# Patient Record
Sex: Female | Born: 1960
Health system: Southern US, Community
[De-identification: ages and names within clinical notes are randomized; demographics above are authoritative.]

## PROBLEM LIST (undated history)

## (undated) DIAGNOSIS — M6530 Trigger finger, unspecified finger: Secondary | ICD-10-CM

## (undated) DIAGNOSIS — F3189 Other bipolar disorder: Secondary | ICD-10-CM

## (undated) DIAGNOSIS — Z972 Presence of dental prosthetic device (complete) (partial): Secondary | ICD-10-CM

## (undated) DIAGNOSIS — M1710 Unilateral primary osteoarthritis, unspecified knee: Secondary | ICD-10-CM

## (undated) DIAGNOSIS — J45909 Unspecified asthma, uncomplicated: Secondary | ICD-10-CM

## (undated) DIAGNOSIS — Z720 Tobacco use: Secondary | ICD-10-CM

## (undated) DIAGNOSIS — I10 Essential (primary) hypertension: Secondary | ICD-10-CM

## (undated) DIAGNOSIS — R569 Unspecified convulsions: Secondary | ICD-10-CM

## (undated) DIAGNOSIS — F32A Depression, unspecified: Secondary | ICD-10-CM

## (undated) DIAGNOSIS — K219 Gastro-esophageal reflux disease without esophagitis: Secondary | ICD-10-CM

## (undated) DIAGNOSIS — N200 Calculus of kidney: Secondary | ICD-10-CM

## (undated) DIAGNOSIS — IMO0002 Reserved for concepts with insufficient information to code with codable children: Secondary | ICD-10-CM

## (undated) HISTORY — DX: Tobacco use: Z72.0

## (undated) HISTORY — DX: Gastro-esophageal reflux disease without esophagitis: K21.9

## (undated) HISTORY — DX: Reserved for concepts with insufficient information to code with codable children: IMO0002

## (undated) HISTORY — DX: Calculus of kidney: N20.0

## (undated) HISTORY — DX: Unilateral primary osteoarthritis, unspecified knee: M17.10

## (undated) HISTORY — DX: Trigger finger, unspecified finger: M65.30

## (undated) HISTORY — PX: FOOT SURGERY: SHX648

## (undated) HISTORY — DX: Other bipolar disorder: F31.89

---

## 2006-09-28 ENCOUNTER — Other Ambulatory Visit: Payer: Self-pay

## 2006-09-28 ENCOUNTER — Emergency Department: Payer: Self-pay | Admitting: Emergency Medicine

## 2008-09-27 ENCOUNTER — Emergency Department: Payer: Self-pay | Admitting: Emergency Medicine

## 2009-08-31 ENCOUNTER — Emergency Department: Payer: Self-pay | Admitting: Emergency Medicine

## 2010-08-17 ENCOUNTER — Ambulatory Visit: Payer: Self-pay | Admitting: Family Medicine

## 2010-09-21 ENCOUNTER — Ambulatory Visit: Payer: Self-pay | Admitting: Internal Medicine

## 2011-03-23 ENCOUNTER — Emergency Department: Payer: Self-pay | Admitting: Emergency Medicine

## 2011-03-24 ENCOUNTER — Ambulatory Visit: Payer: Self-pay | Admitting: Family Medicine

## 2011-08-08 ENCOUNTER — Ambulatory Visit: Payer: Self-pay | Admitting: Podiatry

## 2011-08-08 LAB — CBC WITH DIFFERENTIAL/PLATELET
Basophil #: 0 10*3/uL (ref 0.0–0.1)
Eosinophil #: 0.1 10*3/uL (ref 0.0–0.7)
Eosinophil %: 1.1 %
HCT: 40.1 % (ref 35.0–47.0)
HGB: 13.5 g/dL (ref 12.0–16.0)
Lymphocyte #: 1.9 10*3/uL (ref 1.0–3.6)
MCH: 31.5 pg (ref 26.0–34.0)
MCV: 94 fL (ref 80–100)
Monocyte #: 0.5 10*3/uL (ref 0.0–0.7)
Platelet: 181 10*3/uL (ref 150–440)
RBC: 4.28 10*6/uL (ref 3.80–5.20)
RDW: 13.8 % (ref 11.5–14.5)

## 2011-08-12 ENCOUNTER — Ambulatory Visit: Payer: Self-pay | Admitting: Podiatry

## 2011-08-15 LAB — PATHOLOGY REPORT

## 2011-12-02 ENCOUNTER — Ambulatory Visit: Payer: Self-pay | Admitting: Internal Medicine

## 2012-03-06 ENCOUNTER — Ambulatory Visit: Payer: Self-pay | Admitting: Internal Medicine

## 2012-09-25 ENCOUNTER — Emergency Department: Payer: Self-pay | Admitting: Emergency Medicine

## 2012-09-25 LAB — BASIC METABOLIC PANEL
BUN: 6 mg/dL — ABNORMAL LOW (ref 7–18)
Calcium, Total: 8.9 mg/dL (ref 8.5–10.1)
Chloride: 110 mmol/L — ABNORMAL HIGH (ref 98–107)
EGFR (African American): >60
EGFR (Non-African Amer.): >60
Glucose: 112 mg/dL — ABNORMAL HIGH (ref 65–99)
Sodium: 144 mmol/L (ref 136–145)

## 2012-09-25 LAB — CBC
HCT: 42.2 % (ref 35.0–47.0)
MCH: 32.5 pg (ref 26.0–34.0)
MCHC: 34 g/dL (ref 32.0–36.0)
MCV: 96 fL (ref 80–100)
RBC: 4.42 10*6/uL (ref 3.80–5.20)
RDW: 13.1 % (ref 11.5–14.5)
WBC: 8.9 10*3/uL (ref 3.6–11.0)

## 2012-09-25 LAB — ETHANOL: Ethanol: 168 mg/dL

## 2012-11-13 ENCOUNTER — Ambulatory Visit: Payer: Self-pay | Admitting: Family Medicine

## 2013-02-24 ENCOUNTER — Ambulatory Visit: Payer: Self-pay | Admitting: Family Medicine

## 2013-09-04 ENCOUNTER — Emergency Department: Payer: Self-pay | Admitting: Emergency Medicine

## 2013-09-04 ENCOUNTER — Ambulatory Visit: Payer: Self-pay | Admitting: Physician Assistant

## 2013-09-04 LAB — COMPREHENSIVE METABOLIC PANEL
ALBUMIN: 4.2 g/dL (ref 3.4–5.0)
ALT: 26 U/L (ref 12–78)
Alkaline Phosphatase: 157 U/L — ABNORMAL HIGH
Anion Gap: 6 — ABNORMAL LOW (ref 7–16)
BILIRUBIN TOTAL: 0.7 mg/dL (ref 0.2–1.0)
BUN: 18 mg/dL (ref 7–18)
CALCIUM: 9.2 mg/dL (ref 8.5–10.1)
CHLORIDE: 104 mmol/L (ref 98–107)
CO2: 28 mmol/L (ref 21–32)
Creatinine: 0.65 mg/dL (ref 0.60–1.30)
EGFR (African American): >60
GLUCOSE: 90 mg/dL (ref 65–99)
OSMOLALITY: 277 (ref 275–301)
Potassium: 3.3 mmol/L — ABNORMAL LOW (ref 3.5–5.1)
SGOT(AST): 14 U/L — ABNORMAL LOW (ref 15–37)
Sodium: 138 mmol/L (ref 136–145)
Total Protein: 7.6 g/dL (ref 6.4–8.2)

## 2013-09-04 LAB — CBC
HCT: 42.2 % (ref 35.0–47.0)
HGB: 13.9 g/dL (ref 12.0–16.0)
MCH: 31.3 pg (ref 26.0–34.0)
MCHC: 33 g/dL (ref 32.0–36.0)
MCV: 95 fL (ref 80–100)
PLATELETS: 219 10*3/uL (ref 150–440)
RBC: 4.45 10*6/uL (ref 3.80–5.20)
RDW: 12.9 % (ref 11.5–14.5)
WBC: 8.5 10*3/uL (ref 3.6–11.0)

## 2013-09-04 LAB — CK TOTAL AND CKMB (NOT AT ARMC)
CK, Total: 63 U/L
CK-MB: 1.2 ng/mL (ref 0.5–3.6)

## 2013-09-04 LAB — TROPONIN I: Troponin-I: <0.02 ng/mL

## 2013-10-17 ENCOUNTER — Ambulatory Visit (INDEPENDENT_AMBULATORY_CARE_PROVIDER_SITE_OTHER): Payer: Medicaid Other | Admitting: Cardiovascular Disease

## 2013-10-17 ENCOUNTER — Encounter: Payer: Self-pay | Admitting: Cardiovascular Disease

## 2013-10-17 VITALS — BP 135/96 | HR 104 | Ht 65.0 in | Wt 164.2 lb

## 2013-10-17 DIAGNOSIS — R079 Chest pain, unspecified: Secondary | ICD-10-CM

## 2013-10-17 DIAGNOSIS — F319 Bipolar disorder, unspecified: Secondary | ICD-10-CM

## 2013-10-17 DIAGNOSIS — R9431 Abnormal electrocardiogram [ECG] [EKG]: Secondary | ICD-10-CM | POA: Insufficient documentation

## 2013-10-17 DIAGNOSIS — M549 Dorsalgia, unspecified: Secondary | ICD-10-CM

## 2013-10-17 DIAGNOSIS — R42 Dizziness and giddiness: Secondary | ICD-10-CM

## 2013-10-17 DIAGNOSIS — R55 Syncope and collapse: Secondary | ICD-10-CM

## 2013-10-17 NOTE — Assessment & Plan Note (Signed)
Etiology unclear at this point. No orthostasis noted in the office. Echocardiogram and Holter monitor pending. If these are normal, uncertain if she would benefit from a 30 day monitor. Suggested she keep a diary when wearing her 48-hour Holter monitor

## 2013-10-17 NOTE — Assessment & Plan Note (Signed)
Prior EKGs an EKG today is essentially unchanged and all appear to be relatively normal.

## 2013-10-17 NOTE — Patient Instructions (Addendum)
No medication changes were made.  We will order an echocardiogram to look at your heart function, Try to figure out a cause of your dizziness  Holter monitor has been ordered for dizziness Diary will be provided  Please call us if you have new issues that need to be addressed before your next appt.

## 2013-10-17 NOTE — Assessment & Plan Note (Signed)
She has been on the same medications for several years by her report. Unable to exclude side effects from the medications causing her symptoms, even her dizziness

## 2013-10-17 NOTE — Assessment & Plan Note (Signed)
Borderline prolonged QT noted at the urgent care. This can be monitored. This was also noted on her EKG today with QTc 508. Likely secondary to psychiatric medications

## 2013-10-17 NOTE — Assessment & Plan Note (Signed)
Etiology of her dizzy spells is unclear at this time. No orthostasis noted in the office today. Echocardiogram and Holter monitor has been ordered to rule out structural heart disease and arrhythmia. She reports having symptoms daily. If both of these studies are normal, this would suggest noncardiac etiology. Recommended she buy a blood pressure cuff and monitor her blood pressure when she has dizziness symptoms.

## 2013-10-17 NOTE — Progress Notes (Signed)
[name], female    DOB: 1961/06/17, 53 y.o.   MRN: [id number]  HPI Comments: Katherine Goodwin is a 53 year old woman who is a patient of Dr.Bergland with a history of smoking, bipolar disorder, managed with Adderall and Lamictal,who presents for evaluation of dizziness and near syncope/syncope and abnormal EKG. She reports that she is out on disability, partly for her chronic pain in her back, legs, feet Long smoking history  She reports that over the past 2-3 years, she has had periodic symptoms of dizziness. This has been getting worse over the past year. Now she has symptoms daily, typically while standing. Typically does not have symptoms when sitting or laying down. She reports an episode 09/03/2013 where she was preparing dinner. She walked down the hallway and slumped into the wall. She does not remember what happened. Her husband helped her to the bed. She tried to get up again later and had similar symptoms. Has been reports that her face was red, she called out to him that she was "hot". She went to bed that night, felt better the next day apart from chronic back and foot pain. She went to urgent care and was told that her blood pressure was elevated. She went by EMTs to the emergency room. Details are unavailable  In the office today, blood pressure supine was 160/97, heart rate 99, sitting 131/87 with heart rate 99, standing 132/91 with heart rate 106, in followup to minutes later and 5 minutes later 151/89, 135/96. No orthostasis noted. In terms of her family history she reports grandmother had a stroke, father had MI  Recent lab work in March 2015 showing potassium 3.3 otherwise normal renal function, normal LFTs, elevated alkaline phosphatase, normal CBC, normal chest x-ray  EKG shows Sinus tachycardia with rate 103 beats per minute, no significant ST or T wave changes Prior EKG from the hospital showed normal sinus rhythm with rate 83 beats per minute, borderline prolonged  QT, otherwise essentially normal EKG. Repeat EKG on the same day on 09/04/2013 was also essentially normal       Outpatient Encounter Prescriptions as of 10/17/2013  Medication Sig  . albuterol (PROVENTIL HFA;VENTOLIN HFA) 108 (90 BASE) MCG/ACT inhaler Inhale 2 puffs into the lungs every 6 (six) hours as needed for wheezing or shortness of breath.  . amphetamine-dextroamphetamine (ADDERALL XR) 30 MG 24 hr capsule Take 30 mg by mouth daily.  . clonazePAM (KLONOPIN) 1 MG tablet Take 1 mg by mouth 2 (two) times daily.  . fluticasone (FLONASE) 50 MCG/ACT nasal spray Place 2 sprays into both nostrils daily.  Marland Kitchen ibuprofen (ADVIL,MOTRIN) 800 MG tablet Take 800 mg by mouth every 8 (eight) hours as needed.  . lamoTRIgine (LAMICTAL) 200 MG tablet Take 200 mg by mouth 2 (two) times daily.  . Multiple Vitamin (MULTIVITAMIN) tablet Take 1 tablet by mouth daily.  Marland Kitchen omeprazole (PRILOSEC) 20 MG capsule Take 20 mg by mouth daily.    Review of Systems  Constitutional: Negative.   HENT: Negative.   Eyes: Negative.   Respiratory: Negative.   Cardiovascular: Negative.   Gastrointestinal: Negative.   Endocrine: Negative.   Musculoskeletal: Positive for arthralgias and back pain.  Skin: Negative.   Allergic/Immunologic: Negative.   Neurological: Positive for dizziness and syncope.  Hematological: Negative.   Psychiatric/Behavioral: Negative.   All other systems reviewed and are negative.   BP 135/96  Pulse 104  Ht 5\' 5"  (1.651 m)  Wt 164 lb 4 oz (74.503 kg)  BMI  27.33 kg/m2  Physical Exam  Nursing note and vitals reviewed. Constitutional: She is oriented to person, place, and time. She appears well-developed and well-nourished.  HENT:  Head: Normocephalic.  Nose: Nose normal.  Mouth/Throat: Oropharynx is clear and moist.  Eyes: Conjunctivae are normal. Pupils are equal, round, and reactive to light.  Neck: Normal range of motion. Neck supple. No JVD present.  Cardiovascular: Normal rate,  regular rhythm, S1 normal, S2 normal, normal heart sounds and intact distal pulses.  Exam reveals no gallop and no friction rub.   No murmur heard. Pulmonary/Chest: Effort normal and breath sounds normal. No respiratory distress. She has no wheezes. She has no rales. She exhibits no tenderness.  Abdominal: Soft. Bowel sounds are normal. She exhibits no distension. There is no tenderness.  Musculoskeletal: Normal range of motion. She exhibits no edema and no tenderness.  Lymphadenopathy:    She has no cervical adenopathy.  Neurological: She is alert and oriented to person, place, and time. Coordination normal.  Skin: Skin is warm and dry. No rash noted. No erythema.  Psychiatric: She has a normal mood and affect. Her behavior is normal. Judgment and thought content normal.    Assessment and Plan

## 2013-10-25 ENCOUNTER — Other Ambulatory Visit (INDEPENDENT_AMBULATORY_CARE_PROVIDER_SITE_OTHER): Payer: Medicaid Other

## 2013-10-25 ENCOUNTER — Other Ambulatory Visit: Payer: Self-pay

## 2013-10-25 DIAGNOSIS — R9431 Abnormal electrocardiogram [ECG] [EKG]: Secondary | ICD-10-CM

## 2013-10-25 DIAGNOSIS — R079 Chest pain, unspecified: Secondary | ICD-10-CM

## 2013-10-25 DIAGNOSIS — R55 Syncope and collapse: Secondary | ICD-10-CM

## 2013-10-25 DIAGNOSIS — R42 Dizziness and giddiness: Secondary | ICD-10-CM

## 2013-11-07 ENCOUNTER — Telehealth: Payer: Self-pay

## 2013-11-07 DIAGNOSIS — R55 Syncope and collapse: Secondary | ICD-10-CM

## 2013-11-07 NOTE — Telephone Encounter (Signed)
Pt would like to know holter results

## 2013-11-08 NOTE — Telephone Encounter (Signed)
Attempted to contact pt regarding ECHO and holter results.  No answer, voice mail box is full.

## 2013-11-12 NOTE — Telephone Encounter (Signed)
Reviewed results w/ pt.  She reports that she is still having issues, she stood up to hug her daughter and she fell over. She is interested in the 30 day monitor, but is concerned about the inconvenience of wearing it, as she cleans houses and sweats a lot. Advised her that the 30 day monitor will have instructions to remove during a shower. She is agreeable to this and await call from [employer]. ------

## 2013-11-12 NOTE — Addendum Note (Signed)
Addended by: Dede Query R on: 11/12/2013 09:24 AM   Modules accepted: Orders

## 2013-11-13 ENCOUNTER — Ambulatory Visit (INDEPENDENT_AMBULATORY_CARE_PROVIDER_SITE_OTHER): Payer: Medicaid Other | Admitting: *Deleted

## 2013-11-13 DIAGNOSIS — R42 Dizziness and giddiness: Secondary | ICD-10-CM

## 2013-11-13 DIAGNOSIS — R9431 Abnormal electrocardiogram [ECG] [EKG]: Secondary | ICD-10-CM

## 2013-11-13 DIAGNOSIS — R079 Chest pain, unspecified: Secondary | ICD-10-CM

## 2013-12-25 ENCOUNTER — Telehealth: Payer: Self-pay

## 2013-12-25 NOTE — Telephone Encounter (Signed)
Attempted to contact pt regarding results of event monitor:  "NSR w/ no signifcant arrythmia".  Pt's voicemail box is full, unable to leave message.

## 2013-12-31 ENCOUNTER — Encounter: Payer: Self-pay | Admitting: Cardiovascular Disease

## 2013-12-31 NOTE — Telephone Encounter (Signed)
This encounter was created in error - please disregard.

## 2013-12-31 NOTE — Telephone Encounter (Signed)
Reviewed results w/ pt.   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  Advised pt that if she continues to be symptomatic, we may repeat in cooler weather.  She is agreeable and will call w/ further questions or concerns.

## 2014-10-12 NOTE — Op Note (Signed)
PATIENT NAME:  Katherine Goodwin, Katherine Goodwin MR#:  [id number] DATE OF BIRTH:  12/09/1960  DATE OF PROCEDURE:  08/12/2011  PREOPERATIVE DIAGNOSIS: Plantar fasciitis with heel spur, right foot.   POSTOPERATIVE DIAGNOSIS: Plantar fasciitis with heel spur, right foot.   PROCEDURE: Heel spur resection with partial plantar fasciectomy, right foot.   SURGEON: Sharlotte Alamo, DPM   ANESTHESIA: General laryngeal mask airway anesthesia.   PNEUMATIC TOURNIQUET:  Right calf, 250 mmHg.   ESTIMATED BLOOD LOSS: Minimal.   PATHOLOGY: Bone spur and soft tissue fascia from the right heel.   MATERIALS: None.   COMPLICATIONS: None apparent.   OPERATIVE INDICATIONS: This is a 54 year old female with chronic heel pain in her right foot. Conservative care has proved unsuccessful and she elects for surgical intervention.   OPERATIVE PROCEDURE: The patient was taken to the operating room and placed on the table in the supine position. Following satisfactory  general anesthesia, a pneumatic tourniquet was applied at the level of the right mid calf. The foot was then prepped and draped in the usual sterile fashion. The foot was exsanguinated and the tourniquet inflated to 250 mmHg.   Attention was then directed to the medial aspect of the right heel where an approximate 4-cm linear incision was made coursing proximal to distal at the medial skin line. The incision was deepened via sharp and blunt dissection down to the medial aspect of the heel bone where a periosteal incision was made that extended distal towards the medial band of the fascia. The medial and middle band of the fascia was identified and freed from the inferior aspect of the heel. Using an Allis clamp the fascia was clamped and an approximate 1.5-cm section was removed in toto. At this point the spur was freely identified and removed using a rongeur and raw bony edges were rasped smooth. Intraoperative FluoroScan views revealed good reduction of the deformity. The  wound was flushed with copious amounts of sterile saline and closed using 3-0 Vicryl simple interrupted sutures for deep and superficial subcutaneous closure followed by 4-0 Vicryl running suture for subcutaneous and 5-0 nylon simple interrupted sutures for skin closure. Three 2-0 nylon vertical mattress sutures were then placed as retention sutures across the incision. Xeroform and a sterile bandage were applied to the right foot. The tourniquet was released and blood flow noted to return immediately to the right foot and all digits. A posterior splint was then placed with the foot 90 degrees relative to the leg. The patient tolerated the procedure and anesthesia well and was transported to the postanesthesia care unit with vital signs stable and in good condition.    ____________________________ Sharlotte Alamo, DPM tc:bjt D: 08/12/2011 12:52:56 ET T: 08/12/2011 13:12:45 ET JOB#: [id number]  cc: Sharlotte Alamo, DPM, <Dictator> Barbi Kumagai DPM ELECTRONICALLY SIGNED 08/18/2011 9:05

## 2014-12-07 ENCOUNTER — Other Ambulatory Visit: Payer: Self-pay | Admitting: Internal Medicine

## 2015-05-19 ENCOUNTER — Encounter: Payer: Self-pay | Admitting: Internal Medicine

## 2015-06-12 ENCOUNTER — Ambulatory Visit (INDEPENDENT_AMBULATORY_CARE_PROVIDER_SITE_OTHER): Payer: Medicaid Other | Admitting: Internal Medicine

## 2015-06-12 ENCOUNTER — Encounter: Payer: Self-pay | Admitting: Internal Medicine

## 2015-06-12 VITALS — BP 150/90 | HR 96 | Ht 65.0 in | Wt 158.0 lb

## 2015-06-12 DIAGNOSIS — R030 Elevated blood-pressure reading, without diagnosis of hypertension: Secondary | ICD-10-CM | POA: Diagnosis not present

## 2015-06-12 DIAGNOSIS — F17200 Nicotine dependence, unspecified, uncomplicated: Secondary | ICD-10-CM

## 2015-06-12 DIAGNOSIS — IMO0001 Reserved for inherently not codable concepts without codable children: Secondary | ICD-10-CM | POA: Insufficient documentation

## 2015-06-12 DIAGNOSIS — D485 Neoplasm of uncertain behavior of skin: Secondary | ICD-10-CM | POA: Diagnosis not present

## 2015-06-12 MED ORDER — CLOTRIMAZOLE-BETAMETHASONE 1-0.05 % EX CREA: 1.0000 "application " | TOPICAL_CREAM | Freq: Two times a day (BID) | CUTANEOUS | Status: DC

## 2015-06-12 MED ORDER — NICOTINE 21 MG/24HR TD PT24: 21.0000 mg | MEDICATED_PATCH | Freq: Every day | TRANSDERMAL | Status: DC

## 2015-06-12 NOTE — Patient Instructions (Signed)
Smoking Cessation, Tips for Success If you are ready to quit smoking, congratulations! You have chosen to help yourself be healthier. Cigarettes bring nicotine, tar, carbon monoxide, and other irritants into your body. Your lungs, heart, and blood vessels will be able to work better without these poisons. There are many different ways to quit smoking. Nicotine gum, nicotine patches, a nicotine inhaler, or nicotine nasal spray can help with physical craving. Hypnosis, support groups, and medicines help break the habit of smoking. WHAT THINGS CAN I DO TO MAKE QUITTING EASIER?  Here are some tips to help you quit for good:  Pick a date when you will quit smoking completely. Tell all of your friends and family about your plan to quit on that date.  Do not try to slowly cut down on the number of cigarettes you are smoking. Pick a quit date and quit smoking completely starting on that day.  Throw away all cigarettes.   Clean and remove all ashtrays from your home, work, and car.  On a card, write down your reasons for quitting. Carry the card with you and read it when you get the urge to smoke.  Cleanse your body of nicotine. Drink enough water and fluids to keep your urine clear or pale yellow. Do this after quitting to flush the nicotine from your body.  Learn to predict your moods. Do not let a bad situation be your excuse to have a cigarette. Some situations in your life might tempt you into wanting a cigarette.  Never have "just one" cigarette. It leads to wanting another and another. Remind yourself of your decision to quit.  Change habits associated with smoking. If you smoked while driving or when feeling stressed, try other activities to replace smoking. Stand up when drinking your coffee. Brush your teeth after eating. Sit in a different chair when you read the paper. Avoid alcohol while trying to quit, and try to drink fewer caffeinated beverages. Alcohol and caffeine may urge you to  smoke.  Avoid foods and drinks that can trigger a desire to smoke, such as sugary or spicy foods and alcohol.  Ask people who smoke not to smoke around you.  Have something planned to do right after eating or having a cup of coffee. For example, plan to take a walk or exercise.  Try a relaxation exercise to calm you down and decrease your stress. Remember, you may be tense and nervous for the first 2 weeks after you quit, but this will pass.  Find new activities to keep your hands busy. Play with a pen, coin, or rubber band. Doodle or draw things on paper.  Brush your teeth right after eating. This will help cut down on the craving for the taste of tobacco after meals. You can also try mouthwash.   Use oral substitutes in place of cigarettes. Try using lemon drops, carrots, cinnamon sticks, or chewing gum. Keep them handy so they are available when you have the urge to smoke.  When you have the urge to smoke, try deep breathing.  Designate your home as a nonsmoking area.  If you are a heavy smoker, ask your health care provider about a prescription for nicotine chewing gum. It can ease your withdrawal from nicotine.  Reward yourself. Set aside the cigarette money you save and buy yourself something nice.  Look for support from others. Join a support group or smoking cessation program. Ask someone at home or at work to help you with your plan   to quit smoking.  Always ask yourself, "Do I need this cigarette or is this just a reflex?" Tell yourself, "Today, I choose not to smoke," or "I do not want to smoke." You are reminding yourself of your decision to quit.  Do not replace cigarette smoking with electronic cigarettes (commonly called e-cigarettes). The safety of e-cigarettes is unknown, and some may contain harmful chemicals.  If you relapse, do not give up! Plan ahead and think about what you will do the next time you get the urge to smoke. HOW WILL I FEEL WHEN I QUIT SMOKING? You  may have symptoms of withdrawal because your body is used to nicotine (the addictive substance in cigarettes). You may crave cigarettes, be irritable, feel very hungry, cough often, get headaches, or have difficulty concentrating. The withdrawal symptoms are only temporary. They are strongest when you first quit but will go away within 10-14 days. When withdrawal symptoms occur, stay in control. Think about your reasons for quitting. Remind yourself that these are signs that your body is healing and getting used to being without cigarettes. Remember that withdrawal symptoms are easier to treat than the major diseases that smoking can cause.  Even after the withdrawal is over, expect periodic urges to smoke. However, these cravings are generally short lived and will go away whether you smoke or not. Do not smoke! WHAT RESOURCES ARE AVAILABLE TO HELP ME QUIT SMOKING? Your health care provider can direct you to community resources or hospitals for support, which may include:  Group support.  Education.  Hypnosis.  Therapy.   This information is not intended to replace advice given to you by your health care provider. Make sure you discuss any questions you have with your health care provider.   Document Released: 03/04/2004 Document Revised: 06/27/2014 Document Reviewed: 11/22/2012 Elsevier Interactive Patient Education 2016 [employer]  

## 2015-06-12 NOTE — Progress Notes (Signed)
Date:  06/12/2015   Name:  Katherine Goodwin   DOB:  [birth year]   MRN:  [id number]   Chief Complaint: Rash Rash This is a new problem. The current episode started 1 to 4 weeks ago. The problem has been gradually worsening since onset. The affected locations include the face. The rash is characterized by dryness, redness and scaling. She was exposed to nothing. Pertinent negatives include no eye pain, facial edema, fatigue or shortness of breath. Past treatments include nothing.  Nicotine Dependence Presents for initial visit. Symptoms are negative for fatigue. Preferred tobacco types include cigarettes. Her urge triggers include company of smokers. She smokes 2 packs of cigarettes per day. Past treatments include nicotine patch. Katherine Goodwin is ready to quit (since brother diagnosed with throat cancer).      Review of Systems  Constitutional: Negative for fatigue.  Eyes: Negative for pain.  Respiratory: Negative for chest tightness, shortness of breath and wheezing.   Cardiovascular: Negative for chest pain and palpitations.  Skin: Positive for rash.    Patient Active Problem List   Diagnosis Date Noted  . Dizziness 10/17/2013  . Abnormal EKG 10/17/2013  . Back pain 10/17/2013  . Bipolar disorder ([place]) 10/17/2013  . Prolonged QT interval 10/17/2013  . Near syncope 10/17/2013    Prior to Admission medications   Medication Sig Start Date End Date Taking? Authorizing Provider  amphetamine-dextroamphetamine (ADDERALL XR) 30 MG 24 hr capsule Take 30 mg by mouth daily.   Yes Historical Provider, MD  clonazePAM (KLONOPIN) 1 MG tablet Take 1 mg by mouth 2 (two) times daily.   Yes Historical Provider, MD  fluticasone (FLONASE) 50 MCG/ACT nasal spray Place 2 sprays into both nostrils daily.   Yes Historical Provider, MD  ibuprofen (ADVIL,MOTRIN) 800 MG tablet Take 800 mg by mouth every 8 (eight) hours as needed.   Yes Historical Provider, MD  lamoTRIgine (LAMICTAL) 200 MG tablet Take 200 mg  by mouth 2 (two) times daily.   Yes Historical Provider, MD  Multiple Vitamin (MULTIVITAMIN) tablet Take 1 tablet by mouth daily.   Yes Historical Provider, MD  PROVENTIL HFA 108 (90 BASE) MCG/ACT inhaler INHALE 2 PUFFS BY MOUTH FOUR TIMES DAILY AS NEEDED 12/07/14  Yes Glean Hess, MD  omeprazole (PRILOSEC) 20 MG capsule Take 20 mg by mouth daily. Reported on 06/12/2015    Historical Provider, MD    No Known Allergies  Past Surgical History  Procedure Laterality Date  . Foot surgery      right foot    Social History  Substance Use Topics  . Smoking status: Current Every Day Smoker -- 1.50 packs/day for 39 years    Types: Cigarettes  . Smokeless tobacco: None  . Alcohol Use: 1.8 oz/week    3 Cans of beer per week     Comment: History of problems with alcohol. She drinks 2 (12 oz.) beer every night with 2 shots monthly.     Medication list has been reviewed and updated.   Physical Exam  Constitutional: She is oriented to person, place, and time. She appears well-developed. No distress.  HENT:  Head: Normocephalic and atraumatic.  Eyes: Conjunctivae are normal. Right eye exhibits no discharge. Left eye exhibits no discharge. No scleral icterus.  Cardiovascular: Normal rate, regular rhythm and normal heart sounds.   Pulmonary/Chest: Effort normal and breath sounds normal. No respiratory distress.  Musculoskeletal: Normal range of motion.  Lymphadenopathy:    She has no cervical adenopathy.  Neurological: She  is alert and oriented to person, place, and time.  Skin: Skin is warm and dry. No rash noted.     Psychiatric: She has a normal mood and affect. Her behavior is normal. Thought content normal.  Nursing note and vitals reviewed.   BP 160/100 mmHg  Pulse 96  Ht 5\' 5"  (1.651 m)  Wt 158 lb (71.668 kg)  BMI 26.29 kg/m2  Assessment and Plan: 1. Neoplasm of uncertain behavior of skin of cheek If no improvement in several weeks, will refer to Dermatology -  clotrimazole-betamethasone (LOTRISONE) cream; Apply 1 application topically 2 (two) times daily.  Dispense: 30 g; Refill: 0  2. Tobacco dependence Begin patches - nicotine (NICODERM CQ - DOSED IN MG/24 HOURS) 21 mg/24hr patch; Place 1 patch (21 mg total) onto the skin daily.  Dispense: 28 patch; Refill: 0  3. Elevated blood pressure Will recheck next visit   Halina Maidens, MD [facility]  06/12/2015

## 2015-08-27 ENCOUNTER — Encounter: Payer: Medicaid Other | Admitting: Internal Medicine

## 2016-02-02 ENCOUNTER — Other Ambulatory Visit: Payer: Self-pay | Admitting: Internal Medicine

## 2016-07-14 ENCOUNTER — Ambulatory Visit: Payer: Medicaid Other

## 2016-07-14 ENCOUNTER — Ambulatory Visit
Admission: EM | Admit: 2016-07-14 | Discharge: 2016-07-14 | Disposition: A | Discharge status: Home / Self Care | Payer: Medicaid Other | Attending: Family Medicine | Admitting: Family Medicine

## 2016-07-14 ENCOUNTER — Telehealth: Payer: Self-pay | Admitting: *Deleted

## 2016-07-14 DIAGNOSIS — Z79899 Other long term (current) drug therapy: Secondary | ICD-10-CM | POA: Insufficient documentation

## 2016-07-14 DIAGNOSIS — I4581 Long QT syndrome: Secondary | ICD-10-CM | POA: Insufficient documentation

## 2016-07-14 DIAGNOSIS — W19XXXA Unspecified fall, initial encounter: Secondary | ICD-10-CM | POA: Diagnosis not present

## 2016-07-14 DIAGNOSIS — S76111A Strain of right quadriceps muscle, fascia and tendon, initial encounter: Secondary | ICD-10-CM | POA: Diagnosis present

## 2016-07-14 DIAGNOSIS — F17210 Nicotine dependence, cigarettes, uncomplicated: Secondary | ICD-10-CM | POA: Diagnosis not present

## 2016-07-14 DIAGNOSIS — Z87442 Personal history of urinary calculi: Secondary | ICD-10-CM | POA: Diagnosis not present

## 2016-07-14 DIAGNOSIS — F319 Bipolar disorder, unspecified: Secondary | ICD-10-CM | POA: Insufficient documentation

## 2016-07-14 DIAGNOSIS — K219 Gastro-esophageal reflux disease without esophagitis: Secondary | ICD-10-CM | POA: Diagnosis not present

## 2016-07-14 DIAGNOSIS — M1990 Unspecified osteoarthritis, unspecified site: Secondary | ICD-10-CM | POA: Diagnosis not present

## 2016-07-14 DIAGNOSIS — Z800 Family history of malignant neoplasm of digestive organs: Secondary | ICD-10-CM | POA: Diagnosis not present

## 2016-07-14 DIAGNOSIS — S20211A Contusion of right front wall of thorax, initial encounter: Secondary | ICD-10-CM | POA: Diagnosis not present

## 2016-07-14 DIAGNOSIS — F909 Attention-deficit hyperactivity disorder, unspecified type: Secondary | ICD-10-CM | POA: Insufficient documentation

## 2016-07-14 DIAGNOSIS — Z8249 Family history of ischemic heart disease and other diseases of the circulatory system: Secondary | ICD-10-CM | POA: Diagnosis not present

## 2016-07-14 DIAGNOSIS — D485 Neoplasm of uncertain behavior of skin: Secondary | ICD-10-CM | POA: Insufficient documentation

## 2016-07-14 MED ORDER — KETOROLAC TROMETHAMINE 60 MG/2ML IM SOLN
60.0000 mg | Freq: Once | INTRAMUSCULAR | Status: AC
  Administered 2016-07-14: 60 mg via INTRAMUSCULAR

## 2016-07-14 MED ORDER — MELOXICAM 15 MG PO TABS: 15.0000 mg | ORAL_TABLET | Freq: Every day | ORAL | 0 refills | Status: DC

## 2016-07-14 MED ORDER — OXYCODONE-ACETAMINOPHEN 5-325 MG PO TABS: 1.0000 | ORAL_TABLET | Freq: Three times a day (TID) | ORAL | 0 refills | Status: DC | PRN

## 2016-07-14 NOTE — Telephone Encounter (Signed)
Called patient and informed her that her 7th thru 10th right ribs are fractured with no pneumothorax or hemothorax present. Patient confirmed understanding of information.

## 2016-07-14 NOTE — ED Provider Notes (Addendum)
[facility]    CSN: [id number] Arrival date & time: 07/14/16  1527     History   Chief Complaint Chief Complaint  Patient presents with  . Rib Injury    HPI Katherine Goodwin is a 56 y.o. female.   Patient is here because injury to her right ribs. She states for the snow K last week she was moving a large trash bins of into it time she got one in place with the other one also lose her balance and she fell on the right side bruised her right side. Since then she's been having increased pain and difficulty now the pains got worse she states about 8 out of 10. She has a history of ADHD. She does smoke she has osteoarthritis kidney stones and GERD. She also has a prolonged QT interval and she's had right foot surgery. Father with heart disease and has had multiple CABGs and MI mother has hypertension and a brother was spoke cancer. No known drug allergies.   The history is provided by the patient and the spouse. No language interpreter was used.  Chest Pain  Pain location:  R lateral chest Pain quality: sharp, shooting and stabbing   Pain radiates to:  Does not radiate Pain severity:  Severe Duration:  9 days Timing:  Constant Progression:  Worsening Chronicity:  New Context: breathing and movement   Relieved by:  Nothing Worsened by:  Movement   Past Medical History:  Diagnosis Date  . GERD (gastroesophageal reflux disease)   . Kidney stones   . Osteoarthrosis, unspecified whether generalized or localized, involving lower leg    leg  . Other bipolar disorders   . Tobacco abuse   . Trigger finger     Patient Active Problem List   Diagnosis Date Noted  . Tobacco dependence 06/12/2015  . Elevated blood pressure 06/12/2015  . 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 Dizziness 10/17/2013  . Abnormal EKG 10/17/2013  . Back pain 10/17/2013  . Bipolar disorder ([place]) 10/17/2013  . Prolonged QT interval 10/17/2013  . Near syncope 10/17/2013      Past Surgical History:  Procedure Laterality Date  . FOOT SURGERY     right foot    OB History    No data available       Home Medications    Prior to Admission medications   Medication Sig Start Date End Date Taking? Authorizing Provider  amphetamine-dextroamphetamine (ADDERALL XR) 30 MG 24 hr capsule Take 30 mg by mouth daily.   Yes Historical Provider, MD  clonazePAM (KLONOPIN) 1 MG tablet Take 1 mg by mouth 2 (two) times daily.   Yes Historical Provider, MD  clotrimazole-betamethasone (LOTRISONE) cream Apply 1 application topically 2 (two) times daily. 06/12/15  Yes Glean Hess, MD  ibuprofen (ADVIL,MOTRIN) 800 MG tablet Take 800 mg by mouth every 8 (eight) hours as needed.   Yes Historical Provider, MD  lamoTRIgine (LAMICTAL) 200 MG tablet Take 200 mg by mouth 2 (two) times daily.   Yes Historical Provider, MD  Multiple Vitamin (MULTIVITAMIN) tablet Take 1 tablet by mouth daily.   Yes Historical Provider, MD  PROVENTIL HFA 108 (90 Base) MCG/ACT inhaler INHALE 2 PUFFS BY MOUTH FOUR TIMES DAILY AS NEEDED 02/02/16  Yes Glean Hess, MD  meloxicam (MOBIC) 15 MG tablet Take 1 tablet (15 mg total) by mouth daily. 07/14/16   Frederich Cha, MD  oxyCODONE-acetaminophen (PERCOCET/ROXICET) 5-325 MG tablet Take 1 tablet by  mouth every 8 (eight) hours as needed for severe pain. 07/14/16   Frederich Cha, MD    Family History Family History  Problem Relation Age of Onset  . Heart disease Father 36    CABG x 3  . Heart attack Father 61    MI  . Hypertension Mother   . Throat cancer Brother     Social History Social History  Substance Use Topics  . Smoking status: Current Every Day Smoker    Packs/day: 1.50    Years: 39.00    Types: Cigarettes  . Smokeless tobacco: Never Used  . Alcohol use 1.8 oz/week    3 Cans of beer per week     Comment: History of problems with alcohol. She drinks 2 (12 oz.) beer every night with 2 shots monthly.     Allergies   Patient has no  known allergies.   Review of Systems Review of Systems  Cardiovascular: Positive for chest pain.  All other systems reviewed and are negative.    Physical Exam Triage Vital Signs ED Triage Vitals  Enc Vitals Group     BP 07/14/16 1718 99/65     Pulse Rate 07/14/16 1718 72     Resp 07/14/16 1718 18     Temp 07/14/16 1718 98.3 F (36.8 C)     Temp Source 07/14/16 1718 Oral     SpO2 07/14/16 1718 99 %     Weight 07/14/16 1718 165 lb (74.8 kg)     Height 07/14/16 1718 5\' 5"  (1.651 m)     Head Circumference --      Peak Flow --      Pain Score 07/14/16 1720 7     Pain Loc --      Pain Edu? --      Excl. in [place]? --    No data found.   Updated Vital Signs BP 99/65 (BP Location: Left Arm)   Pulse 72   Temp 98.3 F (36.8 C) (Oral)   Resp 18   Ht 5\' 5"  (1.651 m)   Wt 165 lb (74.8 kg)   SpO2 99%   BMI 27.46 kg/m   Visual Acuity Right Eye Distance:   Left Eye Distance:   Bilateral Distance:    Right Eye Near:   Left Eye Near:    Bilateral Near:     Physical Exam  Constitutional: She is oriented to person, place, and time. She appears well-developed.  HENT:  Head: Normocephalic and atraumatic.  Eyes: EOM are normal. Pupils are equal, round, and reactive to light.  Neck: Normal range of motion. Neck supple.  Cardiovascular: Normal rate and regular rhythm.   Pulmonary/Chest: Effort normal and breath sounds normal. She exhibits tenderness and bony tenderness.    Tenderness over the right lower rib cage warmth of right chest wall  Abdominal: Soft.  Genitourinary: No breast tenderness.  Musculoskeletal: She exhibits tenderness. She exhibits no deformity.  Neurological: She is alert and oriented to person, place, and time.  Skin: Skin is warm and dry.  Psychiatric: She has a normal mood and affect.  Vitals reviewed.    UC Treatments / Results  Labs (all labs ordered are listed, but only abnormal results are displayed) Labs Reviewed - No data to  display  EKG  EKG Interpretation None       Radiology No results found.  Procedures Procedures (including critical care time)  Medications Ordered in UC Medications  ketorolac (TORADOL) injection 60 mg (60 mg Intramuscular  Given 07/14/16 1915)     Initial Impression / Assessment and Plan / UC Course  I have reviewed the triage vital signs and the nursing notes.  Pertinent labs & imaging results that were available during my care of the patient were reviewed by me and considered in my medical decision making (see chart for details).    X-ray right ribs make sure this does not rip fracture. Should be noted the patient is had some bitemporal dental procedures but no gross signs of Percocet no recent narcotic. She is on Adderall and benzodiazepine for anxiety. This was checked in the [facility] drug registry. Work note will be given for today and tomorrow as well. We'll place on Mobic 15 mg daily Percocet  one tablet daily maximum #15  Final Clinical Impressions(s) / UC Diagnoses   Final diagnoses:  Strain of right quadriceps muscle  Rib contusion, right, initial encounter    New Prescriptions New Prescriptions   MELOXICAM (MOBIC) 15 MG TABLET    Take 1 tablet (15 mg total) by mouth daily.   OXYCODONE-ACETAMINOPHEN (PERCOCET/ROXICET) 5-325 MG TABLET    Take 1 tablet by mouth every 8 (eight) hours as needed for severe pain.     Note: This dictation was prepared with Dragon dictation along with smaller phrase technology. Any transcriptional errors that result from this process are unintentional.   A she left before results of x-rays were back but she will be notified that 7-10 she has some acute rib fractures and she is on chronic rib fractures on the right as well. No new changes from the report the plans were to begin with. Frederich Cha, MD 07/14/16 1931    Frederich Cha, MD 07/14/16 [phone]

## 2016-07-14 NOTE — ED Triage Notes (Addendum)
Pt c/o right sided rib pain. Pt fell on Monday right side and has been having rib pain ever sinc. She was carrying some trash can when she fell.  Large softball size bruise on right side

## 2016-12-07 ENCOUNTER — Ambulatory Visit (INDEPENDENT_AMBULATORY_CARE_PROVIDER_SITE_OTHER): Payer: Medicaid Other | Admitting: Internal Medicine

## 2016-12-07 ENCOUNTER — Encounter: Payer: Self-pay | Admitting: Internal Medicine

## 2016-12-07 VITALS — BP 140/84 | HR 100 | Ht 65.0 in | Wt 160.0 lb

## 2016-12-07 DIAGNOSIS — H1032 Unspecified acute conjunctivitis, left eye: Secondary | ICD-10-CM

## 2016-12-07 MED ORDER — NEOMYCIN-POLYMYXIN-DEXAMETH 3.5-10000-0.1 OP SUSP: 2.0000 [drp] | Freq: Four times a day (QID) | OPHTHALMIC | 0 refills | Status: DC

## 2016-12-07 NOTE — Progress Notes (Signed)
Date:  12/07/2016   Name:  Katherine Goodwin   DOB:  [birth year]   MRN:  [id number]   Chief Complaint: Facial Swelling (Left side of face swelling. This morning started with a red little fingerprint spot in corner of left eye. No injury to eye. No pain. Been tanning for 2 months at her pool. Wears tanning glasses when at tanning bed. Don't know what happened. Feels like cloud keeps floating on eye. )  Eye Problem   The left eye is affected. This is a new problem. The current episode started today. The problem occurs constantly. The pain is mild. Associated symptoms include itching. Pertinent negatives include no eye discharge, eye redness, fever or photophobia. She has tried nothing for the symptoms.     Review of Systems  Constitutional: Negative for chills, fatigue and fever.  Eyes: Positive for pain, itching and visual disturbance. Negative for photophobia, discharge and redness.  Respiratory: Negative for chest tightness, shortness of breath and wheezing.   Cardiovascular: Negative for chest pain and palpitations.  Skin: Positive for color change.    Patient Active Problem List   Diagnosis Date Noted  . Tobacco dependence 06/12/2015  . Elevated blood pressure 06/12/2015  . 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 Dizziness 10/17/2013  . Abnormal EKG 10/17/2013  . Back pain 10/17/2013  . Bipolar disorder ([place]) 10/17/2013  . Prolonged QT interval 10/17/2013  . Near syncope 10/17/2013    Prior to Admission medications   Medication Sig Start Date End Date Taking? Authorizing Provider  amphetamine-dextroamphetamine (ADDERALL XR) 30 MG 24 hr capsule Take 30 mg by mouth daily.   Yes [provider]  clonazePAM (KLONOPIN) 1 MG tablet Take 1 mg by mouth 2 (two) times daily.   Yes [provider]  clotrimazole-betamethasone (LOTRISONE) cream Apply 1 application topically 2 (two) times daily. 06/12/15  Yes Glean Hess, MD  ibuprofen  (ADVIL,MOTRIN) 800 MG tablet Take 800 mg by mouth every 8 (eight) hours as needed.   Yes [provider]  lamoTRIgine (LAMICTAL) 200 MG tablet Take 200 mg by mouth 2 (two) times daily.   Yes [provider]  Multiple Vitamin (MULTIVITAMIN) tablet Take 1 tablet by mouth daily.   Yes [provider]  naltrexone (DEPADE) 50 MG tablet Take by mouth daily.   Yes [provider]  PROVENTIL HFA 108 (90 Base) MCG/ACT inhaler INHALE 2 PUFFS BY MOUTH FOUR TIMES DAILY AS NEEDED 02/02/16  Yes Glean Hess, MD    No Known Allergies  Past Surgical History:  Procedure Laterality Date  . FOOT SURGERY     right foot    Social History  Substance Use Topics  . Smoking status: Current Every Day Smoker    Packs/day: 1.50    Years: 39.00    Types: Cigarettes  . Smokeless tobacco: Never Used  . Alcohol use 1.8 oz/week    3 Cans of beer per week     Comment: History of problems with alcohol. She drinks 2 (12 oz.) beer every night with 2 shots monthly.     Medication list has been reviewed and updated.   Physical Exam  Constitutional: She is oriented to person, place, and time. She appears well-developed. No distress.  HENT:  Head: Normocephalic and atraumatic.  Eyes: Pupils are equal, round, and reactive to light. Right eye exhibits no chemosis and no discharge. Left eye exhibits no discharge. Left conjunctiva is injected. Left conjunctiva has no  hemorrhage. Right eye exhibits normal extraocular motion. Left eye exhibits normal extraocular motion.    Erythema under left eye with small pool of blood c/w trauma  Cardiovascular: Normal rate, regular rhythm and normal heart sounds.   Pulmonary/Chest: Effort normal. No respiratory distress.  Musculoskeletal: Normal range of motion.  Neurological: She is alert and oriented to person, place, and time.  Skin: Skin is warm and dry. No rash noted.  Psychiatric: She has a normal mood and affect. Her behavior is  normal. Thought content normal.  Nursing note and vitals reviewed.   BP 140/84   Pulse 100   Ht 5\' 5"  (1.651 m)   Wt 160 lb (72.6 kg)   SpO2 98%   BMI 26.63 kg/m   Assessment and Plan: 1. Acute conjunctivitis of left eye, unspecified acute conjunctivitis type Suspect she traumatized her eye lid during sleep due to eye irritation from early conjunctivitis - neomycin-polymyxin b-dexamethasone (MAXITROL) 3.5-10000-0.1 SUSP; Place 2 drops into both eyes every 6 (six) hours.  Dispense: 5 mL; Refill: 0   Meds ordered this encounter  Medications  . neomycin-polymyxin b-dexamethasone (MAXITROL) 3.5-10000-0.1 SUSP    Sig: Place 2 drops into both eyes every 6 (six) hours.    Dispense:  5 mL    Refill:  0    Halina Maidens, MD [facility]  12/07/2016

## 2017-03-04 ENCOUNTER — Emergency Department
Admission: EM | Admit: 2017-03-04 | Discharge: 2017-03-04 | Disposition: A | Discharge status: Home / Self Care | Payer: Medicaid Other | Attending: Emergency Medicine | Admitting: Emergency Medicine

## 2017-03-04 DIAGNOSIS — R112 Nausea with vomiting, unspecified: Secondary | ICD-10-CM | POA: Insufficient documentation

## 2017-03-04 DIAGNOSIS — R197 Diarrhea, unspecified: Secondary | ICD-10-CM | POA: Insufficient documentation

## 2017-03-04 DIAGNOSIS — R109 Unspecified abdominal pain: Secondary | ICD-10-CM | POA: Diagnosis present

## 2017-03-04 DIAGNOSIS — Z79899 Other long term (current) drug therapy: Secondary | ICD-10-CM | POA: Insufficient documentation

## 2017-03-04 DIAGNOSIS — E876 Hypokalemia: Secondary | ICD-10-CM | POA: Diagnosis not present

## 2017-03-04 DIAGNOSIS — F17210 Nicotine dependence, cigarettes, uncomplicated: Secondary | ICD-10-CM | POA: Insufficient documentation

## 2017-03-04 LAB — CBC WITH DIFFERENTIAL/PLATELET
BASOS ABS: 0 10*3/uL (ref 0–0.1)
Basophils Relative: 1 %
EOS ABS: 0 10*3/uL (ref 0–0.7)
EOS PCT: 0 %
HCT: 45 % (ref 35.0–47.0)
HEMOGLOBIN: 15.8 g/dL (ref 12.0–16.0)
LYMPHS ABS: 0.8 10*3/uL — AB (ref 1.0–3.6)
LYMPHS PCT: 11 %
MCH: 32.4 pg (ref 26.0–34.0)
MCHC: 35.2 g/dL (ref 32.0–36.0)
MCV: 92.3 fL (ref 80.0–100.0)
MONO ABS: 0.4 10*3/uL (ref 0.2–0.9)
MONOS PCT: 5 %
Neutro Abs: 6.5 10*3/uL (ref 1.4–6.5)
Neutrophils Relative %: 83 %
PLATELETS: 197 10*3/uL (ref 150–440)
RBC: 4.87 MIL/uL (ref 3.80–5.20)
RDW: 13.5 % (ref 11.5–14.5)
WBC: 7.8 10*3/uL (ref 3.6–11.0)

## 2017-03-04 LAB — ETHANOL

## 2017-03-04 LAB — COMPREHENSIVE METABOLIC PANEL
ALBUMIN: 4.9 g/dL (ref 3.5–5.0)
ALT: 35 U/L (ref 14–54)
ANION GAP: 15 (ref 5–15)
AST: 47 U/L — AB (ref 15–41)
Alkaline Phosphatase: 114 U/L (ref 38–126)
BUN: 10 mg/dL (ref 6–20)
CHLORIDE: 98 mmol/L — AB (ref 101–111)
CO2: 24 mmol/L (ref 22–32)
Calcium: 9.3 mg/dL (ref 8.9–10.3)
Creatinine, Ser: 0.53 mg/dL (ref 0.44–1.00)
GFR calc Af Amer: >60 mL/min (ref >60)
GFR calc non Af Amer: >60 mL/min (ref >60)
GLUCOSE: 148 mg/dL — AB (ref 65–99)
POTASSIUM: 2.8 mmol/L — AB (ref 3.5–5.1)
Sodium: 137 mmol/L (ref 135–145)
TOTAL PROTEIN: 8.1 g/dL (ref 6.5–8.1)
Total Bilirubin: 1.1 mg/dL (ref 0.3–1.2)

## 2017-03-04 LAB — ACETAMINOPHEN LEVEL

## 2017-03-04 LAB — TROPONIN I: Troponin I: <0.03 ng/mL (ref <0.03)

## 2017-03-04 LAB — SALICYLATE LEVEL

## 2017-03-04 LAB — LIPASE, BLOOD: LIPASE: 31 U/L (ref 11–51)

## 2017-03-04 LAB — CK: Total CK: 66 U/L (ref 38–234)

## 2017-03-04 MED ORDER — AMPHETAMINE-DEXTROAMPHET ER 5 MG PO CP24
30.0000 mg | ORAL_CAPSULE | Freq: Every day | ORAL | Status: DC
  Administered 2017-03-04: 30 mg via ORAL
  Filled 2017-03-04 (×2): qty 6

## 2017-03-04 MED ORDER — LAMOTRIGINE 100 MG PO TABS
200.0000 mg | ORAL_TABLET | Freq: Once | ORAL | Status: AC
  Administered 2017-03-04: 200 mg via ORAL
  Filled 2017-03-04: qty 2

## 2017-03-04 MED ORDER — POTASSIUM CHLORIDE CRYS ER 20 MEQ PO TBCR
40.0000 meq | EXTENDED_RELEASE_TABLET | Freq: Once | ORAL | Status: AC
  Administered 2017-03-04: 40 meq via ORAL
  Filled 2017-03-04: qty 2

## 2017-03-04 MED ORDER — LORAZEPAM 2 MG/ML IJ SOLN
1.0000 mg | Freq: Once | INTRAMUSCULAR | Status: AC
  Administered 2017-03-04: 1 mg via INTRAVENOUS
  Filled 2017-03-04: qty 1

## 2017-03-04 MED ORDER — SODIUM CHLORIDE 0.9 % IV BOLUS (SEPSIS)
1000.0000 mL | Freq: Once | INTRAVENOUS | Status: AC
  Administered 2017-03-04: 1000 mL via INTRAVENOUS

## 2017-03-04 MED ORDER — CLONAZEPAM 1 MG PO TABS: 1.0000 mg | ORAL_TABLET | Freq: Three times a day (TID) | ORAL | 0 refills | Status: DC | PRN

## 2017-03-04 MED ORDER — AMPHETAMINE-DEXTROAMPHETAMINE 30 MG PO TABS
30.0000 mg | ORAL_TABLET | Freq: Every day | ORAL | 0 refills | Status: DC
Start: 2017-03-04 — End: 2019-03-25

## 2017-03-04 MED ORDER — LAMOTRIGINE 200 MG PO TABS: 200.0000 mg | ORAL_TABLET | Freq: Two times a day (BID) | ORAL | 0 refills | Status: DC

## 2017-03-04 MED ORDER — POTASSIUM CHLORIDE ER 10 MEQ PO TBCR: 40.0000 meq | EXTENDED_RELEASE_TABLET | Freq: Two times a day (BID) | ORAL | 0 refills | Status: DC

## 2017-03-04 MED ORDER — CLONAZEPAM 0.5 MG PO TABS
1.0000 mg | ORAL_TABLET | Freq: Once | ORAL | Status: AC
  Administered 2017-03-04: 1 mg via ORAL
  Filled 2017-03-04: qty 2

## 2017-03-04 NOTE — ED Triage Notes (Signed)
Pt here via ems from home, pt states that she has been out of her lamictal, clonopin, and adderall for 2-3 weeks, per EMS pt was d/c'd from the dr who will no longer prescribe to her. Pt reports nausea, vomiting, pain all over with cramping

## 2017-03-04 NOTE — ED Provider Notes (Signed)
[facility] Emergency Department Provider Note  ____________________________________________   First MD Initiated Contact with Patient 03/04/17 1213     (approximate)  I have reviewed the triage vital signs and the nursing notes.   HISTORY  Chief Complaint Nausea   HPI Katherine Goodwin is a 56 y.o. female with a history of GERD, kidney stones as well as bipolar disorder who is presenting to the emergency department today with diffuse body aches, abdominal pain nausea and vomiting. She is no longer seeing her primary care doctor as of 2-3 weeks ago because she says that he does not take her insurance anymore. She says that because of this she is also been out of her medications over the past 2-3 weeks. Her medications included Klonopin as well as Lamictal. She says that after stopping these medications she went through a period of "withdrawal." She says that that lasted several days where she experienced body aches nausea and vomiting. She says that her symptoms then became better but now have worsened again. She says that she has vomited multiple times since last night and is having diffuse abdominal pain which is severe. She also reports body aches as well as diffuse weakness.   Past Medical History:  Diagnosis Date  . GERD (gastroesophageal reflux disease)   . Kidney stones   . Osteoarthrosis, unspecified whether generalized or localized, involving lower leg    leg  . Other bipolar disorders   . Tobacco abuse   . Trigger finger     Patient Active Problem List   Diagnosis Date Noted  . Tobacco dependence 06/12/2015  . Elevated blood pressure 06/12/2015  . 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 Dizziness 10/17/2013  . Abnormal EKG 10/17/2013  . Back pain 10/17/2013  . Bipolar disorder ([place]) 10/17/2013  . Prolonged QT interval 10/17/2013  . Near syncope 10/17/2013    Past Surgical History:  Procedure Laterality Date  . FOOT  SURGERY     right foot    Prior to Admission medications   Medication Sig Start Date End Date Taking? Authorizing Provider  amphetamine-dextroamphetamine (ADDERALL XR) 30 MG 24 hr capsule Take 30 mg by mouth daily.    [provider]  clonazePAM (KLONOPIN) 1 MG tablet Take 1 mg by mouth 2 (two) times daily.    [provider]  clotrimazole-betamethasone (LOTRISONE) cream Apply 1 application topically 2 (two) times daily. 06/12/15   Glean Hess, MD  ibuprofen (ADVIL,MOTRIN) 800 MG tablet Take 800 mg by mouth every 8 (eight) hours as needed.    [provider]  lamoTRIgine (LAMICTAL) 200 MG tablet Take 200 mg by mouth 2 (two) times daily.    [provider]  Multiple Vitamin (MULTIVITAMIN) tablet Take 1 tablet by mouth daily.    [provider]  naltrexone (DEPADE) 50 MG tablet Take by mouth daily.    [provider]  neomycin-polymyxin b-dexamethasone (MAXITROL) 3.5-10000-0.1 SUSP Place 2 drops into both eyes every 6 (six) hours. 12/07/16   Glean Hess, MD  PROVENTIL HFA 108 [phone] Base) MCG/ACT inhaler INHALE 2 PUFFS BY MOUTH FOUR TIMES DAILY AS NEEDED 02/02/16   Glean Hess, MD    Allergies Prozac [fluoxetine hcl] and Seroquel [quetiapine fumarate]  Family History  Problem Relation Age of Onset  . Heart disease Father 8       CABG x 3  . Heart attack Father 42       MI  .  Hypertension Mother   . Throat cancer Brother     Social History Social History  Substance Use Topics  . Smoking status: Current Every Day Smoker    Packs/day: 1.50    Years: 39.00    Types: Cigarettes  . Smokeless tobacco: Never Used  . Alcohol use 1.8 oz/week    3 Cans of beer per week     Comment: History of problems with alcohol. She drinks 2 (12 oz.) beer every night with 2 shots monthly.    Review of Systems  Constitutional: No fever/chills Eyes: No visual changes. ENT: No sore throat. Cardiovascular: Denies chest  pain. Respiratory: Denies shortness of breath. Gastrointestinal:  No diarrhea.  No constipation. Genitourinary: Negative for dysuria. Musculoskeletal: Negative for back pain. Skin: Negative for rash. Neurological: Negative for headaches, focal weakness or numbness.   ____________________________________________   PHYSICAL EXAM:  VITAL SIGNS: ED Triage Vitals [03/04/17 1215]  Enc Vitals Group     BP (!) 200/112     Pulse Rate 85     Resp      Temp (!) 97.5 F (36.4 C)     Temp Source Oral     SpO2 99 %     Weight 165 lb (74.8 kg)     Height 5\' 5"  (1.651 m)     Head Circumference      Peak Flow      Pain Score 8     Pain Loc      Pain Edu?      Excl. in [place]?     Constitutional: Alert and oriented. Patient appears uncomfortable, drawing her knees to her chest. Eyes: Conjunctivae are normal.  Head: Atraumatic. Nose: No congestion/rhinnorhea. Mouth/Throat: Mucous membranes are moist.  Neck: No stridor.   Cardiovascular: Normal rate, regular rhythm. Grossly normal heart sounds.   Respiratory: Normal respiratory effort.  No retractions. Lungs CTAB. Gastrointestinal: Soft but with tenderness to the left upper left lower and right lower quadrants. No guarding or rebound. No rigidity. No distention. No CVA tenderness. Musculoskeletal: No lower extremity tenderness nor edema.  No joint effusions. Neurologic:  Normal speech and language. No gross focal neurologic deficits are appreciated. Skin:  Skin is warm, dry and intact. No rash noted. Psychiatric: Mood and affect are normal. Speech and behavior are normal.  ____________________________________________   LABS (all labs ordered are listed, but only abnormal results are displayed)  Labs Reviewed  CBC WITH DIFFERENTIAL/PLATELET - Abnormal; Notable for the following:       Result Value   Lymphs Abs 0.8 (*)    All other components within normal limits  COMPREHENSIVE METABOLIC PANEL - Abnormal; Notable for the following:     Potassium 2.8 (*)    Chloride 98 (*)    Glucose, Bld 148 (*)    AST 47 (*)    All other components within normal limits  ACETAMINOPHEN LEVEL - Abnormal; Notable for the following:    Acetaminophen (Tylenol), Serum <10 (*)    All other components within normal limits  LIPASE, BLOOD  TROPONIN I  ETHANOL  SALICYLATE LEVEL  CK  URINALYSIS, COMPLETE (UACMP) WITH MICROSCOPIC  URINE DRUG SCREEN, QUALITATIVE ([facility] ONLY)   ____________________________________________  EKG  ED ECG REPORT I, Doran Stabler, the attending physician, personally viewed and interpreted this ECG.   Date: 03/04/2017  EKG Time: 1223  Rate: 74  Rhythm: normal sinus rhythm  Axis: normal  Intervals:nonspecific intraventricular conduction delay  ST&T Change: no ST segment elevation or depression.  T-wave inversions in V2 and V3 as wel s aVL.inversions appear new from previous. Last EKG on the records from 2015.  ____________________________________________  RADIOLOGY   ____________________________________________   PROCEDURES  Procedure(s) performed:   Procedures  Critical Care performed:   ____________________________________________   INITIAL IMPRESSION / ASSESSMENT AND PLAN / ED COURSE  Pertinent labs & imaging results that were available during my care of the patient were reviewed by me and considered in my medical decision making (see chart for details).  I checked the [place] controlled substances reporting system and the patient last hadcontrolled substances including Adderall and clonazepam prescriptions filled on August 7 and 4th respectively.   ----------------------------------------- 2:33 PM on 03/04/2017 -----------------------------------------  Patient with reassuring lab work. Reassessed the patient and she is nontender diffusely to her abdomen. She is calm and is able to tolerate fluids. She has not had any vomiting in the emergency department, nor has she had  diarrhea. I will be prescribing her 2 weeks of her Adderall and Lamictal. However, I said that I would only be able to do several days of her Klonopin and that she would need to follow-up as an outpatient with mental health services. She does understand this plan and willing to comply. I do not see an indication of surgical pathology in the abdomen requiring further workup or imaging. Possible withdrawal symptoms versus psychiatric etiology. Symptoms completely resolved with Ativan as well as fluids.  ____________________________________________   FINAL CLINICAL IMPRESSION(S) / ED DIAGNOSES  Abdominal pain. Nausea and vomiting and diarrhea. Hypokalemia.    NEW MEDICATIONS STARTED DURING THIS VISIT:  New Prescriptions   No medications on file     Note:  This document was prepared using Dragon voice recognition software and may include unintentional dictation errors.     Orbie Pyo, MD 03/04/17 1435

## 2017-03-18 ENCOUNTER — Other Ambulatory Visit: Payer: Self-pay | Admitting: Internal Medicine

## 2018-09-15 IMAGING — CR DG RIBS W/ CHEST 3+V*R*
5 series · 5 of 5 positions shown · non-contrast
Comparison: None.

CLINICAL DATA: Right-sided rib pain after fall.

EXAM:
RIGHT RIBS AND CHEST - 3+ VIEW

[rib pa (1 of 2)]
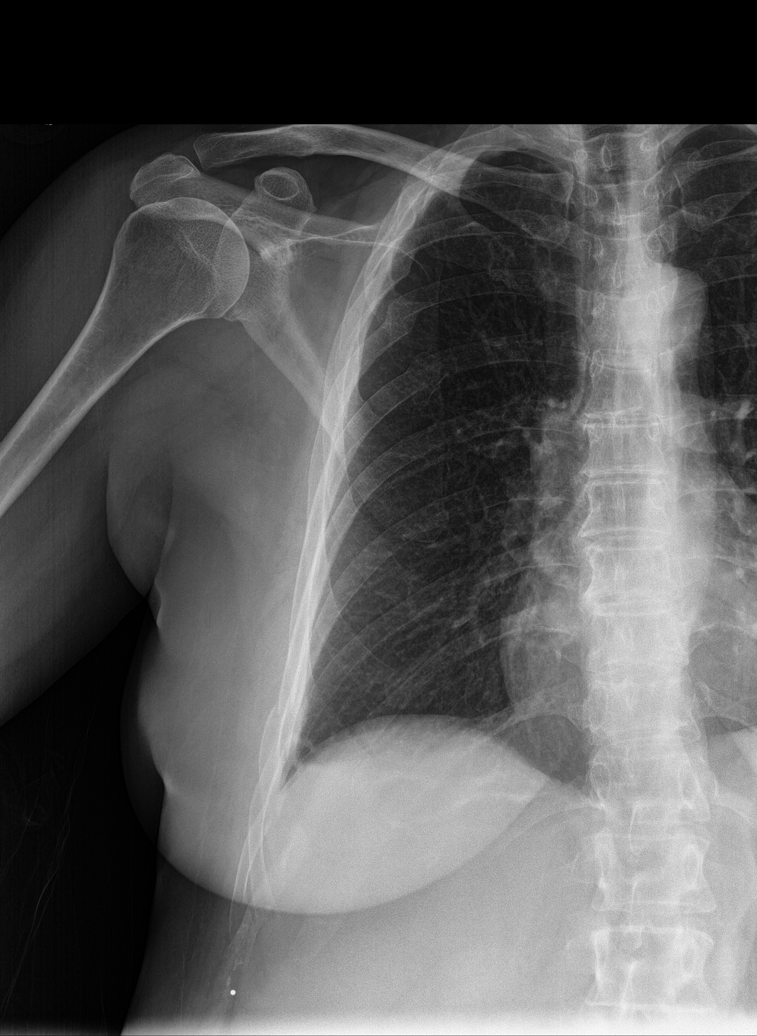

[rib pa (2 of 2)]
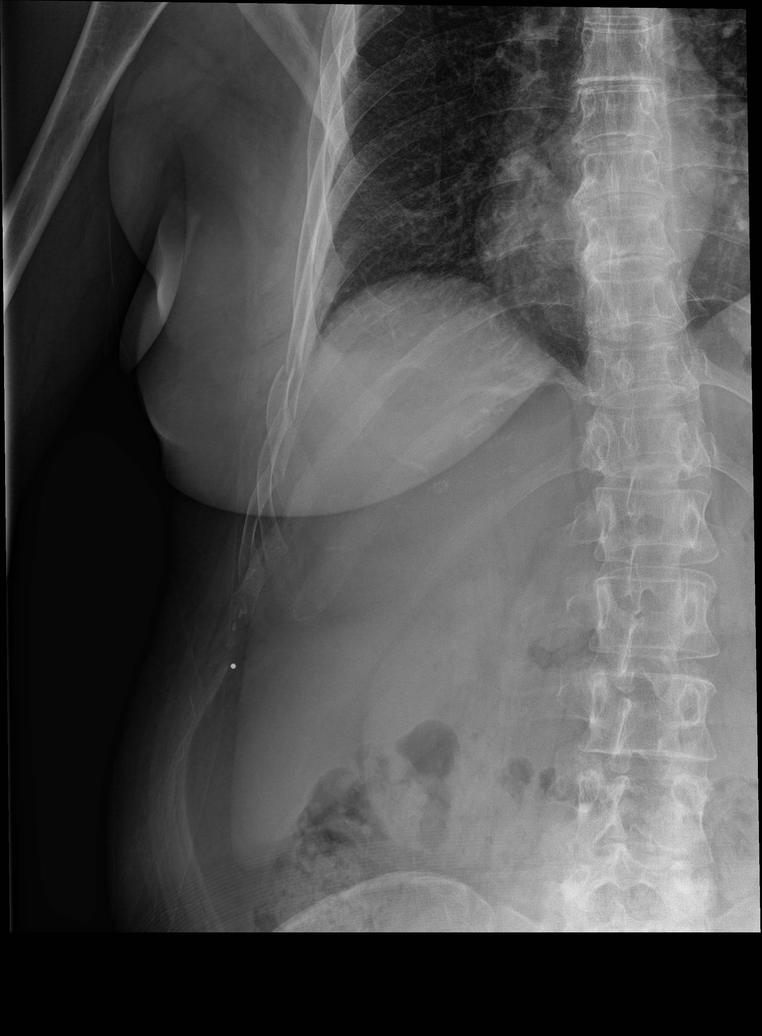

[rib obl (1 of 2)]
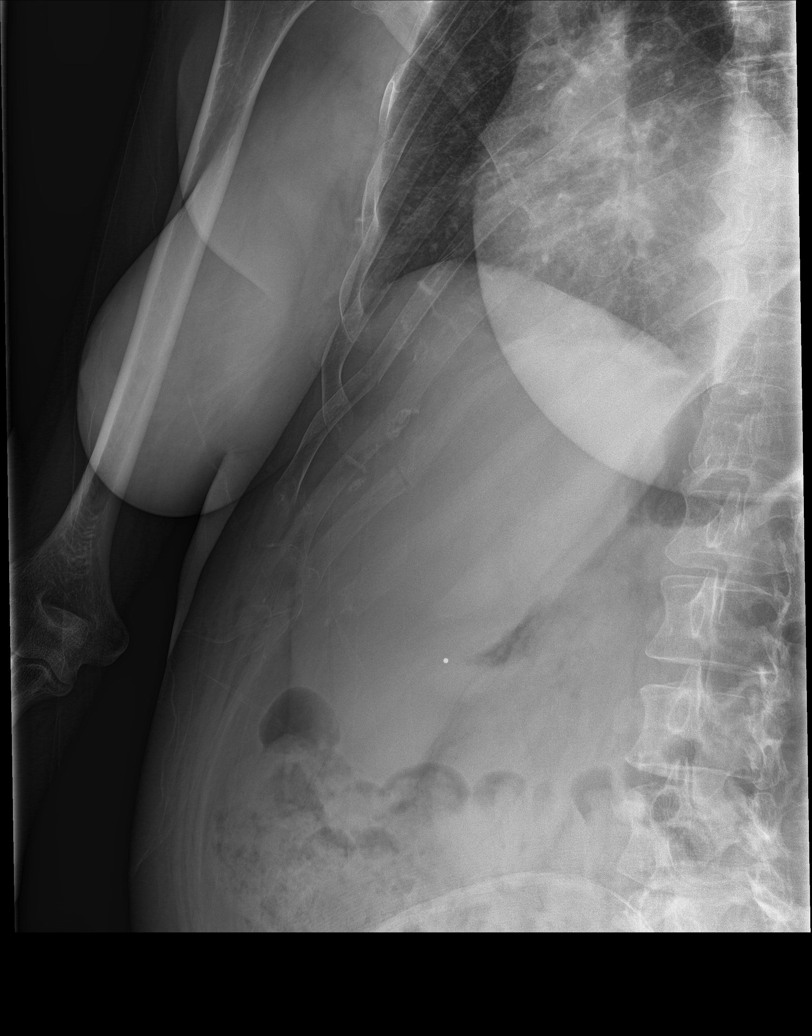

[chest pa]
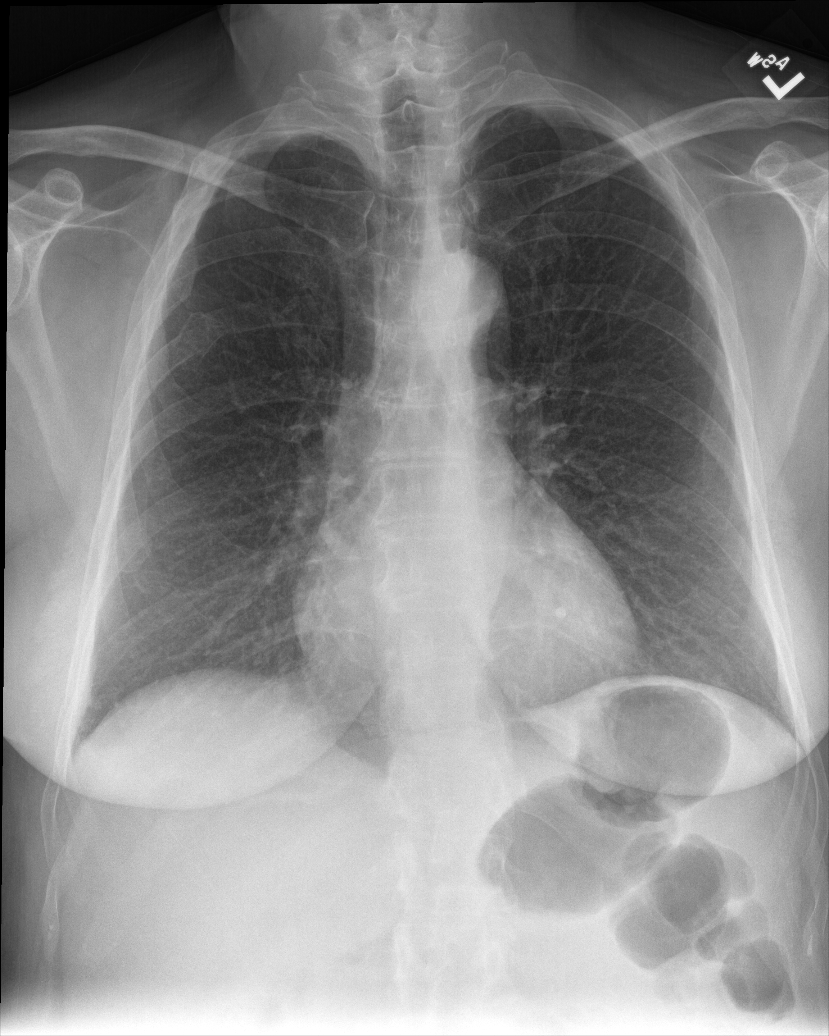

[rib obl (2 of 2)]
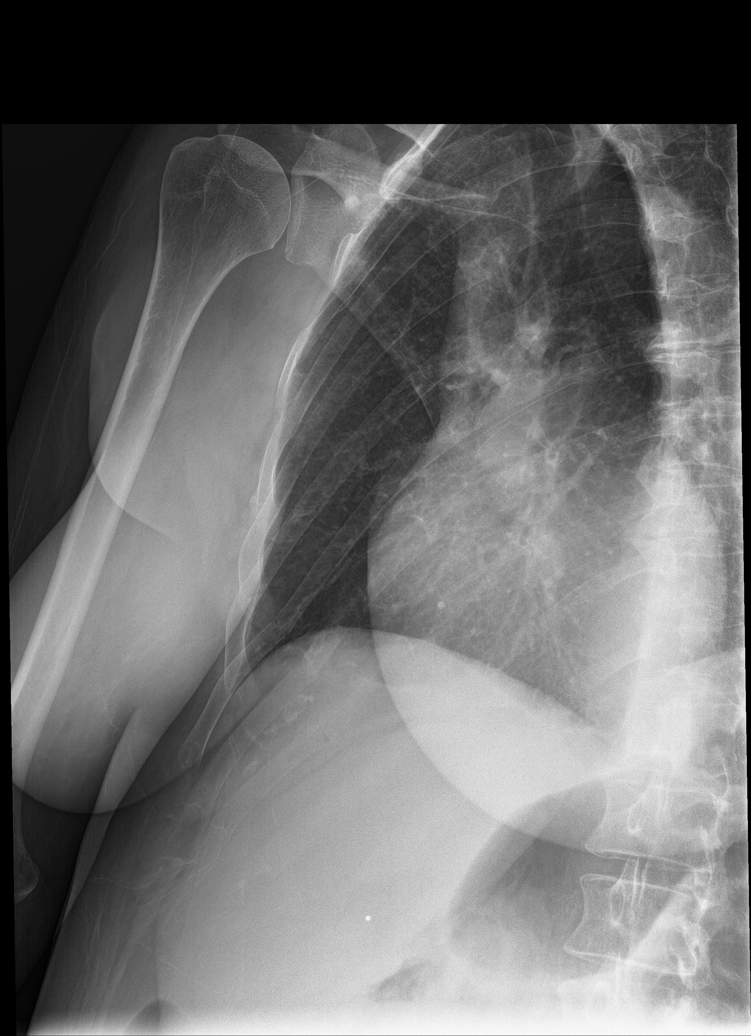

[5 of 5 positions shown; findings below may reference images not displayed]

FINDINGS: Old right second through sixth rib fractures. Acute right lateral
seventh through tenth rib fractures. Minimal atelectasis adjacent to
the fractures without hemothorax pneumothorax. All Heart is normal
in size. The aorta is not aneurysmal.
IMPRESSION: Acute right lateral seventh through tenth rib fractures without
pneumothorax or hemothorax.

Chronic right second through sixth posterior rib fractures.

## 2018-11-26 ENCOUNTER — Emergency Department: Payer: Medicaid Other

## 2018-11-26 ENCOUNTER — Ambulatory Visit: Payer: Medicaid Other | Admitting: Internal Medicine

## 2018-11-26 ENCOUNTER — Telehealth: Payer: Self-pay

## 2018-11-26 ENCOUNTER — Emergency Department
Admission: EM | Admit: 2018-11-26 | Discharge: 2018-11-26 | Disposition: A | Discharge status: Home / Self Care | Payer: Medicaid Other | Attending: Student in an Organized Health Care Education/Training Program | Admitting: Student in an Organized Health Care Education/Training Program

## 2018-11-26 ENCOUNTER — Other Ambulatory Visit: Payer: Self-pay | Admitting: Internal Medicine

## 2018-11-26 ENCOUNTER — Other Ambulatory Visit: Payer: Self-pay

## 2018-11-26 ENCOUNTER — Encounter: Payer: Self-pay | Admitting: Emergency Medicine

## 2018-11-26 DIAGNOSIS — F1210 Cannabis abuse, uncomplicated: Secondary | ICD-10-CM | POA: Diagnosis not present

## 2018-11-26 DIAGNOSIS — F17210 Nicotine dependence, cigarettes, uncomplicated: Secondary | ICD-10-CM | POA: Diagnosis not present

## 2018-11-26 DIAGNOSIS — R112 Nausea with vomiting, unspecified: Secondary | ICD-10-CM | POA: Diagnosis present

## 2018-11-26 DIAGNOSIS — R1084 Generalized abdominal pain: Secondary | ICD-10-CM | POA: Insufficient documentation

## 2018-11-26 DIAGNOSIS — Z20828 Contact with and (suspected) exposure to other viral communicable diseases: Secondary | ICD-10-CM | POA: Diagnosis not present

## 2018-11-26 DIAGNOSIS — R059 Cough, unspecified: Secondary | ICD-10-CM

## 2018-11-26 DIAGNOSIS — Z79899 Other long term (current) drug therapy: Secondary | ICD-10-CM | POA: Insufficient documentation

## 2018-11-26 DIAGNOSIS — R05 Cough: Secondary | ICD-10-CM

## 2018-11-26 DIAGNOSIS — R197 Diarrhea, unspecified: Secondary | ICD-10-CM | POA: Insufficient documentation

## 2018-11-26 LAB — COMPREHENSIVE METABOLIC PANEL
ALT: 54 U/L — ABNORMAL HIGH (ref 0–44)
AST: 98 U/L — ABNORMAL HIGH (ref 15–41)
Albumin: 5 g/dL (ref 3.5–5.0)
Alkaline Phosphatase: 91 U/L (ref 38–126)
Anion gap: 19 — ABNORMAL HIGH (ref 5–15)
BUN: 14 mg/dL (ref 6–20)
CO2: 19 mmol/L — ABNORMAL LOW (ref 22–32)
Calcium: 8.9 mg/dL (ref 8.9–10.3)
Chloride: 101 mmol/L (ref 98–111)
Creatinine, Ser: 0.63 mg/dL (ref 0.44–1.00)
GFR calc Af Amer: >60 mL/min (ref >60)
GFR calc non Af Amer: >60 mL/min (ref >60)
Glucose, Bld: 93 mg/dL (ref 70–99)
Potassium: 3.1 mmol/L — ABNORMAL LOW (ref 3.5–5.1)
Sodium: 139 mmol/L (ref 135–145)
Total Bilirubin: 2.5 mg/dL — ABNORMAL HIGH (ref 0.3–1.2)
Total Protein: 7.8 g/dL (ref 6.5–8.1)

## 2018-11-26 LAB — CBC WITH DIFFERENTIAL/PLATELET
Abs Immature Granulocytes: 0.02 10*3/uL (ref 0.00–0.07)
Basophils Absolute: 0.1 10*3/uL (ref 0.0–0.1)
Basophils Relative: 1 %
Eosinophils Absolute: 0.1 10*3/uL (ref 0.0–0.5)
Eosinophils Relative: 1 %
HCT: 43.5 % (ref 36.0–46.0)
Hemoglobin: 14.8 g/dL (ref 12.0–15.0)
Immature Granulocytes: 0 %
Lymphocytes Relative: 40 %
Lymphs Abs: 2.3 10*3/uL (ref 0.7–4.0)
MCH: 33.5 pg (ref 26.0–34.0)
MCHC: 34 g/dL (ref 30.0–36.0)
MCV: 98.4 fL (ref 80.0–100.0)
Monocytes Absolute: 0.5 10*3/uL (ref 0.1–1.0)
Monocytes Relative: 9 %
Neutro Abs: 2.8 10*3/uL (ref 1.7–7.7)
Neutrophils Relative %: 49 %
Platelets: 157 10*3/uL (ref 150–400)
RBC: 4.42 MIL/uL (ref 3.87–5.11)
RDW: 12.6 % (ref 11.5–15.5)
WBC: 5.6 10*3/uL (ref 4.0–10.5)
nRBC: 0 % (ref 0.0–0.2)

## 2018-11-26 LAB — SARS CORONAVIRUS 2 BY RT PCR (HOSPITAL ORDER, PERFORMED IN ~~LOC~~ HOSPITAL LAB): SARS Coronavirus 2: NEGATIVE

## 2018-11-26 LAB — LIPASE, BLOOD: Lipase: 50 U/L (ref 11–51)

## 2018-11-26 MED ORDER — PANTOPRAZOLE SODIUM 40 MG IV SOLR
40.0000 mg | Freq: Once | INTRAVENOUS | Status: AC
  Administered 2018-11-26: 40 mg via INTRAVENOUS
  Filled 2018-11-26: qty 40

## 2018-11-26 MED ORDER — PANTOPRAZOLE SODIUM 40 MG IV SOLR
40.0000 mg | Freq: Once | INTRAVENOUS | Status: DC
  Filled 2018-11-26: qty 40

## 2018-11-26 MED ORDER — POTASSIUM CHLORIDE CRYS ER 20 MEQ PO TBCR
40.0000 meq | EXTENDED_RELEASE_TABLET | Freq: Once | ORAL | Status: AC
  Administered 2018-11-26: 20:00:00 40 meq via ORAL
  Filled 2018-11-26: qty 2

## 2018-11-26 MED ORDER — SODIUM CHLORIDE 0.9 % IV BOLUS
1000.0000 mL | Freq: Once | INTRAVENOUS | Status: AC
  Administered 2018-11-26: 1000 mL via INTRAVENOUS

## 2018-11-26 MED ORDER — PANTOPRAZOLE SODIUM 20 MG PO TBEC: 20.0000 mg | DELAYED_RELEASE_TABLET | Freq: Every day | ORAL | 1 refills | Status: DC

## 2018-11-26 MED ORDER — IOHEXOL 300 MG/ML  SOLN
100.0000 mL | Freq: Once | INTRAMUSCULAR | Status: AC | PRN
  Administered 2018-11-26: 100 mL via INTRAVENOUS
  Filled 2018-11-26: qty 100

## 2018-11-26 MED ORDER — MAGNESIUM SULFATE 2 GM/50ML IV SOLN
2.0000 g | Freq: Once | INTRAVENOUS | Status: AC
  Administered 2018-11-26: 2 g via INTRAVENOUS
  Filled 2018-11-26: qty 50

## 2018-11-26 MED ORDER — PROMETHAZINE HCL 12.5 MG PO TABS: 12.5000 mg | ORAL_TABLET | Freq: Four times a day (QID) | ORAL | 0 refills | Status: DC | PRN

## 2018-11-26 MED ORDER — MAGNESIUM 30 MG PO TABS: 30.0000 mg | ORAL_TABLET | Freq: Two times a day (BID) | ORAL | 1 refills | Status: DC

## 2018-11-26 MED ORDER — POTASSIUM CHLORIDE ER 10 MEQ PO TBCR: 10.0000 meq | EXTENDED_RELEASE_TABLET | Freq: Every day | ORAL | 0 refills | Status: DC

## 2018-11-26 NOTE — ED Provider Notes (Signed)
[facility] Emergency Department Provider Note    First MD Initiated Contact with Patient 11/26/18 1731     (approximate)  I have reviewed the triage vital signs and the nursing notes.   HISTORY  Chief Complaint Cough; Nausea; and Chills    HPI Katherine Goodwin is a 58 y.o. female presents the ER for 1 week of progressively worsening nausea vomiting and loose dark-colored stools.  She is not been have any fevers but symptoms started after she had a cookout with family.  She does not take any medications.  Has not been taking any NSAIDs.  Is not on any blood thinners.  Denies any chest pain or shortness of breath.  Previously healthy does have a history of reflux.  Does not take any antiacid medication.  States his been I am able to keep anything down.     Past Medical History:  Diagnosis Date   GERD (gastroesophageal reflux disease)    Kidney stones    Osteoarthrosis, unspecified whether generalized or localized, involving lower leg    leg   Other bipolar disorders    Tobacco abuse    Trigger finger    Family History  Problem Relation Age of Onset   Heart disease Father 55       CABG x 3   Heart attack Father 20       MI   Hypertension Mother    Throat cancer Brother    Past Surgical History:  Procedure Laterality Date   FOOT SURGERY     right foot   Patient Active Problem List   Diagnosis Date Noted   Tobacco dependence 06/12/2015   Elevated blood pressure 06/12/2015   Neoplasm of uncertain behavior of skin of cheek 06/12/2015   Dizziness 10/17/2013   Abnormal EKG 10/17/2013   Back pain 10/17/2013   Bipolar disorder ([place]) 10/17/2013   Prolonged QT interval 10/17/2013   Near syncope 10/17/2013      Prior to Admission medications   Medication Sig Start Date End Date Taking? Authorizing Provider  amphetamine-dextroamphetamine (ADDERALL) 30 MG tablet Take 1 tablet by mouth daily. 03/04/17   Schaevitz, Randall An,  MD  clonazePAM (KLONOPIN) 1 MG tablet Take 1 tablet (1 mg total) by mouth 3 (three) times daily as needed for anxiety. 03/04/17 03/04/18  Orbie Pyo, MD  ibuprofen (ADVIL,MOTRIN) 800 MG tablet Take 800 mg by mouth every 8 (eight) hours as needed.    [provider]  lamoTRIgine (LAMICTAL) 200 MG tablet Take 1 tablet (200 mg total) by mouth 2 (two) times daily. 03/04/17   Schaevitz, Randall An, MD  magnesium 30 MG tablet Take 1 tablet (30 mg total) by mouth 2 (two) times daily. 11/26/18   Merlyn Lot, MD  Multiple Vitamin (MULTIVITAMIN) tablet Take 1 tablet by mouth daily.    [provider]  naltrexone (DEPADE) 50 MG tablet Take by mouth daily.    [provider]  pantoprazole (PROTONIX) 20 MG tablet Take 1 tablet (20 mg total) by mouth daily. 11/26/18 11/26/19  Merlyn Lot, MD  potassium chloride (K-DUR) 10 MEQ tablet Take 4 tablets (40 mEq total) by mouth 2 (two) times daily. 03/04/17   Schaevitz, Randall An, MD  potassium chloride (K-DUR) 10 MEQ tablet Take 1 tablet (10 mEq total) by mouth daily. 11/26/18   Merlyn Lot, MD  promethazine (PHENERGAN) 12.5 MG tablet Take 1 tablet (12.5 mg total) by mouth every 6 (six) hours as needed for nausea or vomiting.  11/26/18   Merlyn Lot, MD  PROVENTIL HFA [phone] Base) MCG/ACT inhaler INHALE 2 PUFFS BY MOUTH FOUR TIMES DAILY AS NEEDED 03/18/17   Glean Hess, MD    Allergies Prozac [fluoxetine hcl] and Seroquel [quetiapine fumarate]    Social History Social History   Tobacco Use   Smoking status: Current Every Day Smoker    Packs/day: 1.50    Years: 39.00    Pack years: 58.50    Types: Cigarettes   Smokeless tobacco: Never Used  Substance Use Topics   Alcohol use: Yes    Alcohol/week: 3.0 standard drinks    Types: 3 Cans of beer per week    Comment: History of problems with alcohol. She drinks 2 (12 oz.) beer every night with 2 shots monthly.   Drug use: Yes    Types:  Marijuana    Comment: Smokes Marijuana 2-3 times a month.     Review of Systems Patient denies headaches, rhinorrhea, blurry vision, numbness, shortness of breath, chest pain, edema, cough, abdominal pain, nausea, vomiting, diarrhea, dysuria, fevers, rashes or hallucinations unless otherwise stated above in HPI. ____________________________________________   PHYSICAL EXAM:  VITAL SIGNS: Vitals:   11/26/18 1628 11/26/18 2021  BP: (!) 154/109 (!) 145/92  Pulse: 100 94  Resp: 18 16  Temp: 98.1 F (36.7 C) 98 F (36.7 C)  SpO2: 98% 98%    Constitutional: Alert and oriented.  Eyes: Conjunctivae are normal.  Head: Atraumatic. Nose: No congestion/rhinnorhea. Mouth/Throat: Mucous membranes are moist.   Neck: No stridor. Painless ROM.  Cardiovascular: Normal rate, regular rhythm. Grossly normal heart sounds.  Good peripheral circulation. Respiratory: Normal respiratory effort.  No retractions. Lungs CTAB. Gastrointestinal: Soft and nontender. No distention. No abdominal bruits. No CVA tenderness. Genitourinary: Nonthrombosed hemorrhoids.  No melena, faintly guaiac positive stool.  No hematochezia. Musculoskeletal: No lower extremity tenderness nor edema.  No joint effusions. Neurologic:  Normal speech and language. No gross focal neurologic deficits are appreciated. No facial droop Skin:  Skin is warm, dry and intact. No rash noted. Psychiatric: Mood and affect are normal. Speech and behavior are normal.  ____________________________________________   LABS (all labs ordered are listed, but only abnormal results are displayed)  Results for orders placed or performed during the hospital encounter of 11/26/18 (from the past 24 hour(s))  CBC with Differential     Status: None   Collection Time: 11/26/18  5:22 PM  Result Value Ref Range   WBC 5.6 4.0 - 10.5 K/uL   RBC 4.42 3.87 - 5.11 MIL/uL   Hemoglobin 14.8 12.0 - 15.0 g/dL   HCT 43.5 36.0 - 46.0 %   MCV 98.4 80.0 - 100.0 fL    MCH 33.5 26.0 - 34.0 pg   MCHC 34.0 30.0 - 36.0 g/dL   RDW 12.6 11.5 - 15.5 %   Platelets 157 150 - 400 K/uL   nRBC 0.0 0.0 - 0.2 %   Neutrophils Relative % 49 %   Neutro Abs 2.8 1.7 - 7.7 K/uL   Lymphocytes Relative 40 %   Lymphs Abs 2.3 0.7 - 4.0 K/uL   Monocytes Relative 9 %   Monocytes Absolute 0.5 0.1 - 1.0 K/uL   Eosinophils Relative 1 %   Eosinophils Absolute 0.1 0.0 - 0.5 K/uL   Basophils Relative 1 %   Basophils Absolute 0.1 0.0 - 0.1 K/uL   Immature Granulocytes 0 %   Abs Immature Granulocytes 0.02 0.00 - 0.07 K/uL  Comprehensive metabolic panel  Status: Abnormal   Collection Time: 11/26/18  5:22 PM  Result Value Ref Range   Sodium 139 135 - 145 mmol/L   Potassium 3.1 (L) 3.5 - 5.1 mmol/L   Chloride 101 98 - 111 mmol/L   CO2 19 (L) 22 - 32 mmol/L   Glucose, Bld 93 70 - 99 mg/dL   BUN 14 6 - 20 mg/dL   Creatinine, Ser 0.63 0.44 - 1.00 mg/dL   Calcium 8.9 8.9 - 10.3 mg/dL   Total Protein 7.8 6.5 - 8.1 g/dL   Albumin 5.0 3.5 - 5.0 g/dL   AST 98 (H) 15 - 41 U/L   ALT 54 (H) 0 - 44 U/L   Alkaline Phosphatase 91 38 - 126 U/L   Total Bilirubin 2.5 (H) 0.3 - 1.2 mg/dL   GFR calc non Af Amer >60 >60 mL/min   GFR calc Af Amer >60 >60 mL/min   Anion gap 19 (H) 5 - 15  Lipase, blood     Status: None   Collection Time: 11/26/18  5:22 PM  Result Value Ref Range   Lipase 50 11 - 51 U/L  SARS Coronavirus 2 (CEPHEID- Performed in [place] hospital lab), Hosp Order     Status: None   Collection Time: 11/26/18  7:06 PM  Result Value Ref Range   SARS Coronavirus 2 NEGATIVE NEGATIVE   ____________________________________________ ____________________________________________  RADIOLOGY  I personally reviewed all radiographic images ordered to evaluate for the above acute complaints and reviewed radiology reports and findings.  These findings were personally discussed with the patient.  Please see medical record for radiology  report.  ____________________________________________   PROCEDURES  Procedure(s) performed:  Procedures    Critical Care performed: no ____________________________________________   INITIAL IMPRESSION / ASSESSMENT AND PLAN / ED COURSE  Pertinent labs & imaging results that were available during my care of the patient were reviewed by me and considered in my medical decision making (see chart for details).   DDX: Viral enteritis, gastritis, pneumonia, diverticulitis, duodenitis, cholecystitis, appendicitis, mass  Katherine Goodwin is a 58 y.o. who presents to the ED with symptoms as described above.  She is nontoxic appearing well and symptoms have been ongoing for quite some time now.  Blood will be sent for by differential.  The patient will be placed on continuous pulse oximetry and telemetry for monitoring.  Laboratory evaluation will be sent to evaluate for the above complaints.     Clinical Course as of Nov 25 2305  Mon Nov 26, 2018  2010 Blood work is reassuring.  Hemoglobin normal.  BUN is not elevated.  CT imaging also reassuring.  No evidence of duodenitis.  Do suspect some component of gastritis.  She is not having any melena.  Does have some weakly positive stool but also has hemorrhoids which may be giving false positive.    [PR]    Clinical Course User Index [PR] Merlyn Lot, MD   Discussed results and CT imaging with patient.  She is requesting discharge and is asymptomatic I think that is appropriate.  Will start on antiemetic as well as antiacid medication with outpatient follow-up with GI.  The patient was evaluated in Emergency Department today for the symptoms described in the history of present illness. He/she was evaluated in the context of the global COVID-19 pandemic, which necessitated consideration that the patient might be at risk for infection with the SARS-CoV-2 virus that causes COVID-19. Institutional protocols and algorithms that pertain to the  evaluation  of patients at risk for COVID-19 are in a state of rapid change based on information released by regulatory bodies including the CDC and federal and state organizations. These policies and algorithms were followed during the patient's care in the ED.   As part of my medical decision making, I reviewed the following data within the [facility] notes reviewed and incorporated, Labs reviewed, notes from prior ED visits and [place] Controlled Substance Database   ____________________________________________   FINAL CLINICAL IMPRESSION(S) / ED DIAGNOSES  Final diagnoses:  Non-intractable vomiting with nausea, unspecified vomiting type      NEW MEDICATIONS STARTED DURING THIS VISIT:  Discharge Medication List as of 11/26/2018  8:22 PM    START taking these medications   Details  magnesium 30 MG tablet Take 1 tablet (30 mg total) by mouth 2 (two) times daily., Starting Mon 11/26/2018, Normal    pantoprazole (PROTONIX) 20 MG tablet Take 1 tablet (20 mg total) by mouth daily., Starting Mon 11/26/2018, Until Tue 11/26/2019, Normal    !! potassium chloride (K-DUR) 10 MEQ tablet Take 1 tablet (10 mEq total) by mouth daily., Starting Mon 11/26/2018, Normal    promethazine (PHENERGAN) 12.5 MG tablet Take 1 tablet (12.5 mg total) by mouth every 6 (six) hours as needed for nausea or vomiting., Starting Mon 11/26/2018, Normal     !! - Potential duplicate medications found. Please discuss with provider.       Note:  This document was prepared using Dragon voice recognition software and may include unintentional dictation errors.    Merlyn Lot, MD 11/26/18 [phone]

## 2018-11-26 NOTE — Discharge Instructions (Addendum)
Please return for any worsening symptoms, questions or concerns.  Be sure to take magnesium supplement as well.  Follow up with PCP for repeat blood work.

## 2018-11-26 NOTE — Telephone Encounter (Signed)
Patient called saying she has been having cough, sob, cold chills, hot sweats, tremors, loss of appetite since Last Monday 11/19/2018. Called patient back and she said she is also having stomach irritation with black stools since Wednesday. She has vomited a few times. Patient does not have a thermometer at home. She said she cannot eat and doesn't feel well.  Spoke with Dr. Army Melia and informed patient she recommends her to evaluated at the ER. Told her she should call before she goes to inform them of her symptoms and she may need a CT or [place] of her stomach for black stools. Told her they may also have her tested for COVID.  She verbalized understanding and said she will go to the ER when her husband gets home from work soon.  Told her to make sure she goes because we don't want her to get worse. She said she will.

## 2018-11-26 NOTE — ED Triage Notes (Signed)
Cough nausea and chills x 1 week.

## 2018-12-03 ENCOUNTER — Ambulatory Visit (INDEPENDENT_AMBULATORY_CARE_PROVIDER_SITE_OTHER): Payer: Medicaid Other | Admitting: Gastroenterology

## 2018-12-03 ENCOUNTER — Other Ambulatory Visit: Payer: Self-pay

## 2018-12-03 ENCOUNTER — Encounter: Payer: Self-pay | Admitting: Gastroenterology

## 2018-12-03 DIAGNOSIS — R748 Abnormal levels of other serum enzymes: Secondary | ICD-10-CM

## 2018-12-03 DIAGNOSIS — K760 Fatty (change of) liver, not elsewhere classified: Secondary | ICD-10-CM | POA: Insufficient documentation

## 2018-12-03 DIAGNOSIS — K921 Melena: Secondary | ICD-10-CM

## 2018-12-03 NOTE — Progress Notes (Signed)
Katherine Lame, MD [address]  [place]  [place],  [address]  Main: [phone]  Fax: [phone]    Gastroenterology Virtual/Video Visit  Referring Provider:     Glean Hess, MD Primary Care Physician:  Glean Hess, MD Primary Gastroenterologist:  Dr.Labron Bloodgood [place] Reason for Consultation:     Consulted from the ER for nausea vomiting with dark stools.        HPI:    Virtual Visit via Video Note Location of the patient: Home Location of provider: Office  Participating persons: The patient myself and Katherine Goodwin.  I connected with Katherine Goodwin on 12/03/18 at  2:45 PM EDT by a video enabled telemedicine application and verified that I am speaking with the correct person using two identifiers.   I discussed the limitations of evaluation and management by telemedicine and the availability of in person appointments. The patient expressed understanding and agreed to proceed.  Verbal consent to proceed obtained.  History of Present Illness: Katherine Goodwin is a 58 y.o. female referred by Dr. Army Melia, Jesse Sans, MD  for consultation & management of dark stools with nausea vomiting.  The patient reports that she used to take NSAIDs years ago on a regular basis but only takes Advil infrequently at the present time.  The patient was in the emergency room on June 8 for severe abdominal pain.  The patient reports that she has a history of a father with colon polyps and a mother with bleeding ulcers.  The patient states that she was having very dark stools and went to the emergency room and was given promethazine Protonix and potassium.  The patient's hemoglobin in the ER was 14.8.  She reports that since being discharged from the hospital she is feeling better but has not returned to normal.  The patient also was noted to have increased liver enzymes with her a AST higher than her ALT.  The patient reports that she has approximately 4 shots a day of hard liquor and  has been doing this for 15 to 20 years.  She reports that she has a brother and grandfather with cirrhosis from drinking.  There is no report of any hematemesis.  She also reports that her abdominal pain has improved but has not completely resolved.  Past Medical History:  Diagnosis Date   GERD (gastroesophageal reflux disease)    Kidney stones    Osteoarthrosis, unspecified whether generalized or localized, involving lower leg    leg   Other bipolar disorders    Tobacco abuse    Trigger finger     Past Surgical History:  Procedure Laterality Date   FOOT SURGERY     right foot    Prior to Admission medications   Medication Sig Start Date End Date Taking? Authorizing Provider  amphetamine-dextroamphetamine (ADDERALL) 30 MG tablet Take 1 tablet by mouth daily. 03/04/17  Yes Schaevitz, Randall An, MD  ibuprofen (ADVIL,MOTRIN) 800 MG tablet Take 800 mg by mouth every 8 (eight) hours as needed.   Yes [provider]  lamoTRIgine (LAMICTAL) 100 MG tablet Take 100 mg by mouth 2 (two) times daily. 07/11/18  Yes [provider]  lamoTRIgine (LAMICTAL) 200 MG tablet Take 1 tablet (200 mg total) by mouth 2 (two) times daily. 03/04/17  Yes Schaevitz, Randall An, MD  magnesium 30 MG tablet Take 1 tablet (30 mg total) by mouth 2 (two) times daily. 11/26/18  Yes Merlyn Lot, MD  Multiple Vitamin (MULTIVITAMIN) tablet  Take 1 tablet by mouth daily.   Yes [provider]  naltrexone (DEPADE) 50 MG tablet Take by mouth daily.   Yes [provider]  pantoprazole (PROTONIX) 20 MG tablet Take 1 tablet (20 mg total) by mouth daily. 11/26/18 11/26/19 Yes Merlyn Lot, MD  potassium chloride (K-DUR) 10 MEQ tablet Take 4 tablets (40 mEq total) by mouth 2 (two) times daily. 03/04/17  Yes Schaevitz, Randall An, MD  potassium chloride (K-DUR) 10 MEQ tablet Take 1 tablet (10 mEq total) by mouth daily. 11/26/18  Yes Merlyn Lot, MD  promethazine (PHENERGAN)  12.5 MG tablet Take 1 tablet (12.5 mg total) by mouth every 6 (six) hours as needed for nausea or vomiting. 11/26/18  Yes Merlyn Lot, MD  PROVENTIL HFA 108 [phone] Base) MCG/ACT inhaler INHALE 2 PUFFS BY MOUTH FOUR TIMES DAILY AS NEEDED 03/18/17  Yes Glean Hess, MD  clonazePAM (KLONOPIN) 0.5 MG tablet TAKE 1 TABLET BY MOUTH 4 TIMES DAILY FOR ANXIETY 07/11/18   [provider]  clonazePAM (KLONOPIN) 1 MG tablet Take 1 tablet (1 mg total) by mouth 3 (three) times daily as needed for anxiety. 03/04/17 03/04/18  Orbie Pyo, MD  VRAYLAR capsule Take 1.5 mg by mouth. 06/11/18   [provider]    Family History  Problem Relation Age of Onset   Heart disease Father 35       CABG x 3   Heart attack Father 71       MI   Hypertension Mother    Throat cancer Brother      Social History   Tobacco Use   Smoking status: Current Every Day Smoker    Packs/day: 1.50    Years: 39.00    Pack years: 58.50    Types: Cigarettes   Smokeless tobacco: Never Used  Substance Use Topics   Alcohol use: Yes    Alcohol/week: 7.0 standard drinks    Types: 3 Cans of beer, 4 Shots of liquor per week    Comment: History of problems with alcohol. She drinks 2 (12 oz.) beer every night with 2 shots monthly.   Drug use: Yes    Types: Marijuana    Comment: Smokes Marijuana 2-3 times a month.     Allergies as of 12/03/2018 - Review Complete 12/03/2018  Allergen Reaction Noted   Prozac [fluoxetine hcl] Other (See Comments) 03/04/2017   Seroquel [quetiapine fumarate] Other (See Comments) 03/04/2017    Review of Systems:    All systems reviewed and negative except where noted in HPI.   Observations/Objective:  Labs: CBC    Component Value Date/Time   WBC 5.6 11/26/2018 1722   RBC 4.42 11/26/2018 1722   HGB 14.8 11/26/2018 1722   HGB 13.9 09/04/2013 1953   HCT 43.5 11/26/2018 1722   HCT 42.2 09/04/2013 1953   PLT 157 11/26/2018 1722   PLT 219 09/04/2013  1953   MCV 98.4 11/26/2018 1722   MCV 95 09/04/2013 1953   MCH 33.5 11/26/2018 1722   MCHC 34.0 11/26/2018 1722   RDW 12.6 11/26/2018 1722   RDW 12.9 09/04/2013 1953   LYMPHSABS 2.3 11/26/2018 1722   LYMPHSABS 1.9 08/08/2011 1412   MONOABS 0.5 11/26/2018 1722   MONOABS 0.5 08/08/2011 1412   EOSABS 0.1 11/26/2018 1722   EOSABS 0.1 08/08/2011 1412   BASOSABS 0.1 11/26/2018 1722   BASOSABS 0.0 08/08/2011 1412   CMP     Component Value Date/Time   NA 139 11/26/2018 1722  NA 138 09/04/2013 1953   K 3.1 (L) 11/26/2018 1722   K 3.3 (L) 09/04/2013 1953   CL 101 11/26/2018 1722   CL 104 09/04/2013 1953   CO2 19 (L) 11/26/2018 1722   CO2 28 09/04/2013 1953   GLUCOSE 93 11/26/2018 1722   GLUCOSE 90 09/04/2013 1953   BUN 14 11/26/2018 1722   BUN 18 09/04/2013 1953   CREATININE 0.63 11/26/2018 1722   CREATININE 0.65 09/04/2013 1953   CALCIUM 8.9 11/26/2018 1722   CALCIUM 9.2 09/04/2013 1953   PROT 7.8 11/26/2018 1722   PROT 7.6 09/04/2013 1953   ALBUMIN 5.0 11/26/2018 1722   ALBUMIN 4.2 09/04/2013 1953   AST 98 (H) 11/26/2018 1722   AST 14 (L) 09/04/2013 1953   ALT 54 (H) 11/26/2018 1722   ALT 26 09/04/2013 1953   ALKPHOS 91 11/26/2018 1722   ALKPHOS 157 (H) 09/04/2013 1953   BILITOT 2.5 (H) 11/26/2018 1722   BILITOT 0.7 09/04/2013 1953   GFRNONAA >60 11/26/2018 1722   GFRNONAA >60 09/04/2013 1953   GFRAA >60 11/26/2018 1722   GFRAA >60 09/04/2013 1953    Imaging Studies: Dg Chest 1 View  Result Date: 11/26/2018 CLINICAL DATA:  Cough nausea and chills x 1 week, history of smoking, osteoarthrosis, kidney stones, GERD EXAM: CHEST  1 VIEW COMPARISON:  07/14/2016 FINDINGS: Lungs are clear. Heart size and mediastinal contours are within normal limits. No effusion. Old healed right rib fractures. IMPRESSION: No acute cardiopulmonary disease. Electronically Signed   By: Lucrezia Europe M.D.   On: 11/26/2018 18:33   Ct Abdomen Pelvis W Contrast  Result Date: 11/26/2018 CLINICAL DATA:   Acute generalized abdominal pain. EXAM: CT ABDOMEN AND PELVIS WITH CONTRAST TECHNIQUE: Multidetector CT imaging of the abdomen and pelvis was performed using the standard protocol following bolus administration of intravenous contrast. CONTRAST:  149mL OMNIPAQUE IOHEXOL 300 MG/ML  SOLN COMPARISON:  None. FINDINGS: Lower chest: No acute abnormality. Hepatobiliary: No gallstones or biliary dilatation is noted. Hepatic steatosis is noted. Pancreas: Unremarkable. No pancreatic ductal dilatation or surrounding inflammatory changes. Spleen: Normal in size without focal abnormality. Adrenals/Urinary Tract: Adrenal glands appear normal. Nonobstructive left nephrolithiasis is noted. No hydronephrosis or renal obstruction is noted. Urinary bladder is unremarkable. Stomach/Bowel: Stomach is within normal limits. Appendix appears normal. No evidence of bowel wall thickening, distention, or inflammatory changes. Vascular/Lymphatic: Aortic atherosclerosis. No enlarged abdominal or pelvic lymph nodes. Reproductive: Uterus and bilateral adnexa are unremarkable. Other: No abdominal wall hernia or abnormality. No abdominopelvic ascites. Musculoskeletal: No acute or significant osseous findings. IMPRESSION: Hepatic steatosis. Nonobstructive left nephrolithiasis. No other abnormality seen in the abdomen or pelvis. Aortic Atherosclerosis (ICD10-I70.0). Electronically Signed   By: Marijo Conception M.D.   On: 11/26/2018 19:55    Assessment and Plan:   Katherine Goodwin is a 58 y.o. y/o female has been referred for black stools with loose bowel movements that have come back to normal color on Protonix.  The patient has a family history of colon polyps and peptic ulcer disease.  The patient has been told that since she had the dark stools diarrhea and abdominal pain she will be set up for a EGD and colonoscopy.  The patient has been told to continue her Protonix and has been encouraged to stop drinking or at least cut down and has been  told that her smoking will not help her peptic ulcer disease.  The patient has agreed to proceeding with the EGD and colonoscopy. I have discussed  risks & benefits which include, but are not limited to, bleeding, infection, perforation & drug reaction.  The patient agrees with this plan & written consent will be obtained.     Follow Up Instructions:  I discussed the assessment and treatment plan with the patient. The patient was provided an opportunity to ask questions and all were answered. The patient agreed with the plan and demonstrated an understanding of the instructions.   The patient was advised to call back or seek an in-person evaluation if the symptoms worsen or if the condition fails to improve as anticipated.  I provided 20 minutes of non-face-to-face time during this encounter.   Katherine Lame, MD  Speech recognition software was used to dictate the above note.

## 2018-12-03 NOTE — H&P (View-Only) (Signed)
Katherine Lame, MD [address]  [place]  [place], [place] [address]  Main: [phone]  Fax: [phone]    Gastroenterology Virtual/Video Visit  Referring Provider:     Glean Hess, MD Primary Care Physician:  Glean Hess, MD Primary Gastroenterologist:  Dr.Zylee Marchiano Allen Norris Reason for Consultation:     Consulted from the ER for nausea vomiting with dark stools.        HPI:    Virtual Visit via Video Note Location of the patient: Home Location of provider: Office  Participating persons: The patient myself and Katherine Goodwin.  I connected with Katherine Goodwin on 12/03/18 at  2:45 PM EDT by a video enabled telemedicine application and verified that I am speaking with the correct person using two identifiers.   I discussed the limitations of evaluation and management by telemedicine and the availability of in person appointments. The patient expressed understanding and agreed to proceed.  Verbal consent to proceed obtained.  History of Present Illness: Katherine Goodwin is a 58 y.o. female referred by Dr. Army Goodwin, Katherine Sans, MD  for consultation & management of dark stools with nausea vomiting.  The patient reports that she used to take NSAIDs years ago on a regular basis but only takes Advil infrequently at the present time.  The patient was in the emergency room on June 8 for severe abdominal pain.  The patient reports that she has a history of a father with colon polyps and a mother with bleeding ulcers.  The patient states that she was having very dark stools and went to the emergency room and was given promethazine Protonix and potassium.  The patient's hemoglobin in the ER was 14.8.  She reports that since being discharged from the hospital she is feeling better but has not returned to normal.  The patient also was noted to have increased liver enzymes with her a AST higher than her ALT.  The patient reports that she has approximately 4 shots a day of hard liquor and  has been doing this for 15 to 20 years.  She reports that she has a brother and grandfather with cirrhosis from drinking.  There is no report of any hematemesis.  She also reports that her abdominal pain has improved but has not completely resolved.  Past Medical History:  Diagnosis Date   GERD (gastroesophageal reflux disease)    Kidney stones    Osteoarthrosis, unspecified whether generalized or localized, involving lower leg    leg   Other bipolar disorders    Tobacco abuse    Trigger finger     Past Surgical History:  Procedure Laterality Date   FOOT SURGERY     right foot    Prior to Admission medications   Medication Sig Start Date End Date Taking? Authorizing Provider  amphetamine-dextroamphetamine (ADDERALL) 30 MG tablet Take 1 tablet by mouth daily. 03/04/17  Yes Schaevitz, Randall An, MD  ibuprofen (ADVIL,MOTRIN) 800 MG tablet Take 800 mg by mouth every 8 (eight) hours as needed.   Yes [provider]  lamoTRIgine (LAMICTAL) 100 MG tablet Take 100 mg by mouth 2 (two) times daily. 07/11/18  Yes [provider]  lamoTRIgine (LAMICTAL) 200 MG tablet Take 1 tablet (200 mg total) by mouth 2 (two) times daily. 03/04/17  Yes Schaevitz, Randall An, MD  magnesium 30 MG tablet Take 1 tablet (30 mg total) by mouth 2 (two) times daily. 11/26/18  Yes Merlyn Lot, MD  Multiple Vitamin (MULTIVITAMIN) tablet  Take 1 tablet by mouth daily.   Yes [provider]  naltrexone (DEPADE) 50 MG tablet Take by mouth daily.   Yes [provider]  pantoprazole (PROTONIX) 20 MG tablet Take 1 tablet (20 mg total) by mouth daily. 11/26/18 11/26/19 Yes Merlyn Lot, MD  potassium chloride (K-DUR) 10 MEQ tablet Take 4 tablets (40 mEq total) by mouth 2 (two) times daily. 03/04/17  Yes Schaevitz, Randall An, MD  potassium chloride (K-DUR) 10 MEQ tablet Take 1 tablet (10 mEq total) by mouth daily. 11/26/18  Yes Merlyn Lot, MD  promethazine (PHENERGAN)  12.5 MG tablet Take 1 tablet (12.5 mg total) by mouth every 6 (six) hours as needed for nausea or vomiting. 11/26/18  Yes Merlyn Lot, MD  PROVENTIL HFA 108 [phone] Base) MCG/ACT inhaler INHALE 2 PUFFS BY MOUTH FOUR TIMES DAILY AS NEEDED 03/18/17  Yes Glean Hess, MD  clonazePAM (KLONOPIN) 0.5 MG tablet TAKE 1 TABLET BY MOUTH 4 TIMES DAILY FOR ANXIETY 07/11/18   [provider]  clonazePAM (KLONOPIN) 1 MG tablet Take 1 tablet (1 mg total) by mouth 3 (three) times daily as needed for anxiety. 03/04/17 03/04/18  Orbie Pyo, MD  VRAYLAR capsule Take 1.5 mg by mouth. 06/11/18   [provider]    Family History  Problem Relation Age of Onset   Heart disease Father 73       CABG x 3   Heart attack Father 20       MI   Hypertension Mother    Throat cancer Brother      Social History   Tobacco Use   Smoking status: Current Every Day Smoker    Packs/day: 1.50    Years: 39.00    Pack years: 58.50    Types: Cigarettes   Smokeless tobacco: Never Used  Substance Use Topics   Alcohol use: Yes    Alcohol/week: 7.0 standard drinks    Types: 3 Cans of beer, 4 Shots of liquor per week    Comment: History of problems with alcohol. She drinks 2 (12 oz.) beer every night with 2 shots monthly.   Drug use: Yes    Types: Marijuana    Comment: Smokes Marijuana 2-3 times a month.     Allergies as of 12/03/2018 - Review Complete 12/03/2018  Allergen Reaction Noted   Prozac [fluoxetine hcl] Other (See Comments) 03/04/2017   Seroquel [quetiapine fumarate] Other (See Comments) 03/04/2017    Review of Systems:    All systems reviewed and negative except where noted in HPI.   Observations/Objective:  Labs: CBC    Component Value Date/Time   WBC 5.6 11/26/2018 1722   RBC 4.42 11/26/2018 1722   HGB 14.8 11/26/2018 1722   HGB 13.9 09/04/2013 1953   HCT 43.5 11/26/2018 1722   HCT 42.2 09/04/2013 1953   PLT 157 11/26/2018 1722   PLT 219 09/04/2013  1953   MCV 98.4 11/26/2018 1722   MCV 95 09/04/2013 1953   MCH 33.5 11/26/2018 1722   MCHC 34.0 11/26/2018 1722   RDW 12.6 11/26/2018 1722   RDW 12.9 09/04/2013 1953   LYMPHSABS 2.3 11/26/2018 1722   LYMPHSABS 1.9 08/08/2011 1412   MONOABS 0.5 11/26/2018 1722   MONOABS 0.5 08/08/2011 1412   EOSABS 0.1 11/26/2018 1722   EOSABS 0.1 08/08/2011 1412   BASOSABS 0.1 11/26/2018 1722   BASOSABS 0.0 08/08/2011 1412   CMP     Component Value Date/Time   NA 139 11/26/2018 1722  NA 138 09/04/2013 1953   K 3.1 (L) 11/26/2018 1722   K 3.3 (L) 09/04/2013 1953   CL 101 11/26/2018 1722   CL 104 09/04/2013 1953   CO2 19 (L) 11/26/2018 1722   CO2 28 09/04/2013 1953   GLUCOSE 93 11/26/2018 1722   GLUCOSE 90 09/04/2013 1953   BUN 14 11/26/2018 1722   BUN 18 09/04/2013 1953   CREATININE 0.63 11/26/2018 1722   CREATININE 0.65 09/04/2013 1953   CALCIUM 8.9 11/26/2018 1722   CALCIUM 9.2 09/04/2013 1953   PROT 7.8 11/26/2018 1722   PROT 7.6 09/04/2013 1953   ALBUMIN 5.0 11/26/2018 1722   ALBUMIN 4.2 09/04/2013 1953   AST 98 (H) 11/26/2018 1722   AST 14 (L) 09/04/2013 1953   ALT 54 (H) 11/26/2018 1722   ALT 26 09/04/2013 1953   ALKPHOS 91 11/26/2018 1722   ALKPHOS 157 (H) 09/04/2013 1953   BILITOT 2.5 (H) 11/26/2018 1722   BILITOT 0.7 09/04/2013 1953   GFRNONAA >60 11/26/2018 1722   GFRNONAA >60 09/04/2013 1953   GFRAA >60 11/26/2018 1722   GFRAA >60 09/04/2013 1953    Imaging Studies: Dg Chest 1 View  Result Date: 11/26/2018 CLINICAL DATA:  Cough nausea and chills x 1 week, history of smoking, osteoarthrosis, kidney stones, GERD EXAM: CHEST  1 VIEW COMPARISON:  07/14/2016 FINDINGS: Lungs are clear. Heart size and mediastinal contours are within normal limits. No effusion. Old healed right rib fractures. IMPRESSION: No acute cardiopulmonary disease. Electronically Signed   By: Katherine Goodwin M.D.   On: 11/26/2018 18:33   Ct Abdomen Pelvis W Contrast  Result Date: 11/26/2018 CLINICAL DATA:   Acute generalized abdominal pain. EXAM: CT ABDOMEN AND PELVIS WITH CONTRAST TECHNIQUE: Multidetector CT imaging of the abdomen and pelvis was performed using the standard protocol following bolus administration of intravenous contrast. CONTRAST:  13mL OMNIPAQUE IOHEXOL 300 MG/ML  SOLN COMPARISON:  None. FINDINGS: Lower chest: No acute abnormality. Hepatobiliary: No gallstones or biliary dilatation is noted. Hepatic steatosis is noted. Pancreas: Unremarkable. No pancreatic ductal dilatation or surrounding inflammatory changes. Spleen: Normal in size without focal abnormality. Adrenals/Urinary Tract: Adrenal glands appear normal. Nonobstructive left nephrolithiasis is noted. No hydronephrosis or renal obstruction is noted. Urinary bladder is unremarkable. Stomach/Bowel: Stomach is within normal limits. Appendix appears normal. No evidence of bowel wall thickening, distention, or inflammatory changes. Vascular/Lymphatic: Aortic atherosclerosis. No enlarged abdominal or pelvic lymph nodes. Reproductive: Uterus and bilateral adnexa are unremarkable. Other: No abdominal wall hernia or abnormality. No abdominopelvic ascites. Musculoskeletal: No acute or significant osseous findings. IMPRESSION: Hepatic steatosis. Nonobstructive left nephrolithiasis. No other abnormality seen in the abdomen or pelvis. Aortic Atherosclerosis (ICD10-I70.0). Electronically Signed   By: Katherine Goodwin M.D.   On: 11/26/2018 19:55    Assessment and Plan:   FLORIA BRANDAU is a 58 y.o. y/o female has been referred for black stools with loose bowel movements that have come back to normal color on Protonix.  The patient has a family history of colon polyps and peptic ulcer disease.  The patient has been told that since she had the dark stools diarrhea and abdominal pain she will be set up for a EGD and colonoscopy.  The patient has been told to continue her Protonix and has been encouraged to stop drinking or at least cut down and has been  told that her smoking will not help her peptic ulcer disease.  The patient has agreed to proceeding with the EGD and colonoscopy. I have discussed  risks & benefits which include, but are not limited to, bleeding, infection, perforation & drug reaction.  The patient agrees with this plan & written consent will be obtained.     Follow Up Instructions:  I discussed the assessment and treatment plan with the patient. The patient was provided an opportunity to ask questions and all were answered. The patient agreed with the plan and demonstrated an understanding of the instructions.   The patient was advised to call back or seek an in-person evaluation if the symptoms worsen or if the condition fails to improve as anticipated.  I provided 20 minutes of non-face-to-face time during this encounter.   Katherine Lame, MD  Speech recognition software was used to dictate the above note.

## 2018-12-05 ENCOUNTER — Other Ambulatory Visit: Payer: Self-pay

## 2018-12-05 DIAGNOSIS — K921 Melena: Secondary | ICD-10-CM

## 2018-12-10 ENCOUNTER — Encounter: Payer: Self-pay | Admitting: *Deleted

## 2018-12-10 ENCOUNTER — Other Ambulatory Visit: Payer: Self-pay

## 2018-12-11 ENCOUNTER — Other Ambulatory Visit
Admission: RE | Admit: 2018-12-11 | Discharge: 2018-12-11 | Disposition: A | Discharge status: Home / Self Care | Payer: Medicaid Other | Source: Ambulatory Visit | Attending: Gastroenterology | Admitting: Gastroenterology

## 2018-12-11 DIAGNOSIS — Z1159 Encounter for screening for other viral diseases: Secondary | ICD-10-CM | POA: Insufficient documentation

## 2018-12-12 LAB — NOVEL CORONAVIRUS, NAA (HOSP ORDER, SEND-OUT TO REF LAB; TAT 18-24 HRS): SARS-CoV-2, NAA: NOT DETECTED

## 2018-12-13 ENCOUNTER — Other Ambulatory Visit: Payer: Self-pay

## 2018-12-13 MED ORDER — NA SULFATE-K SULFATE-MG SULF 17.5-3.13-1.6 GM/177ML PO SOLN: 1.0000 | Freq: Once | ORAL | 0 refills | Status: AC

## 2018-12-13 NOTE — Discharge Instructions (Signed)
General Anesthesia, Adult, Care After  This sheet gives you information about how to care for yourself after your procedure. Your health care provider may also give you more specific instructions. If you have problems or questions, contact your health care provider.  What can I expect after the procedure?  After the procedure, the following side effects are common:  Pain or discomfort at the IV site.  Nausea.  Vomiting.  Sore throat.  Trouble concentrating.  Feeling cold or chills.  Weak or tired.  Sleepiness and fatigue.  Soreness and body aches. These side effects can affect parts of the body that were not involved in surgery.  Follow these instructions at home:    For at least 24 hours after the procedure:  Have a responsible adult stay with you. It is important to have someone help care for you until you are awake and alert.  Rest as needed.  Do not:  Participate in activities in which you could fall or become injured.  Drive.  Use heavy machinery.  Drink alcohol.  Take sleeping pills or medicines that cause drowsiness.  Make important decisions or sign legal documents.  Take care of children on your own.  Eating and drinking  Follow any instructions from your health care provider about eating or drinking restrictions.  When you feel hungry, start by eating small amounts of foods that are soft and easy to digest (bland), such as toast. Gradually return to your regular diet.  Drink enough fluid to keep your urine pale yellow.  If you vomit, rehydrate by drinking water, juice, or clear broth.  General instructions  If you have sleep apnea, surgery and certain medicines can increase your risk for breathing problems. Follow instructions from your health care provider about wearing your sleep device:  Anytime you are sleeping, including during daytime naps.  While taking prescription pain medicines, sleeping medicines, or medicines that make you drowsy.  Return to your normal activities as told by your health care  provider. Ask your health care provider what activities are safe for you.  Take over-the-counter and prescription medicines only as told by your health care provider.  If you smoke, do not smoke without supervision.  Keep all follow-up visits as told by your health care provider. This is important.  Contact a health care provider if:  You have nausea or vomiting that does not get better with medicine.  You cannot eat or drink without vomiting.  You have pain that does not get better with medicine.  You are unable to pass urine.  You develop a skin rash.  You have a fever.  You have redness around your IV site that gets worse.  Get help right away if:  You have difficulty breathing.  You have chest pain.  You have blood in your urine or stool, or you vomit blood.  Summary  After the procedure, it is common to have a sore throat or nausea. It is also common to feel tired.  Have a responsible adult stay with you for the first 24 hours after general anesthesia. It is important to have someone help care for you until you are awake and alert.  When you feel hungry, start by eating small amounts of foods that are soft and easy to digest (bland), such as toast. Gradually return to your regular diet.  Drink enough fluid to keep your urine pale yellow.  Return to your normal activities as told by your health care provider. Ask your health care   provider what activities are safe for you.  This information is not intended to replace advice given to you by your health care provider. Make sure you discuss any questions you have with your health care provider.  Document Released: 09/12/2000 Document Revised: 01/20/2017 Document Reviewed: 01/20/2017  Elsevier Interactive Patient Education  2019 [employer]

## 2018-12-14 ENCOUNTER — Ambulatory Visit
Admission: RE | Admit: 2018-12-14 | Discharge: 2018-12-14 | Disposition: A | Discharge status: Home / Self Care | Payer: Medicaid Other | Source: Ambulatory Visit | Attending: Gastroenterology | Admitting: Gastroenterology

## 2018-12-14 ENCOUNTER — Ambulatory Visit: Payer: Medicaid Other | Admitting: Anesthesiology

## 2018-12-14 ENCOUNTER — Encounter
Admission: RE | Disposition: A | Discharge status: Home / Self Care | Payer: Self-pay | Source: Ambulatory Visit | Attending: Gastroenterology

## 2018-12-14 ENCOUNTER — Other Ambulatory Visit: Payer: Self-pay

## 2018-12-14 DIAGNOSIS — K219 Gastro-esophageal reflux disease without esophagitis: Secondary | ICD-10-CM | POA: Insufficient documentation

## 2018-12-14 DIAGNOSIS — K641 Second degree hemorrhoids: Secondary | ICD-10-CM | POA: Insufficient documentation

## 2018-12-14 DIAGNOSIS — Z8371 Family history of colonic polyps: Secondary | ICD-10-CM

## 2018-12-14 DIAGNOSIS — M1990 Unspecified osteoarthritis, unspecified site: Secondary | ICD-10-CM | POA: Diagnosis not present

## 2018-12-14 DIAGNOSIS — K2950 Unspecified chronic gastritis without bleeding: Secondary | ICD-10-CM | POA: Diagnosis not present

## 2018-12-14 DIAGNOSIS — J45909 Unspecified asthma, uncomplicated: Secondary | ICD-10-CM | POA: Diagnosis not present

## 2018-12-14 DIAGNOSIS — K2970 Gastritis, unspecified, without bleeding: Secondary | ICD-10-CM | POA: Diagnosis not present

## 2018-12-14 DIAGNOSIS — Z79899 Other long term (current) drug therapy: Secondary | ICD-10-CM | POA: Diagnosis not present

## 2018-12-14 DIAGNOSIS — K921 Melena: Secondary | ICD-10-CM

## 2018-12-14 DIAGNOSIS — Z888 Allergy status to other drugs, medicaments and biological substances status: Secondary | ICD-10-CM | POA: Insufficient documentation

## 2018-12-14 DIAGNOSIS — D124 Benign neoplasm of descending colon: Secondary | ICD-10-CM | POA: Diagnosis not present

## 2018-12-14 DIAGNOSIS — K635 Polyp of colon: Secondary | ICD-10-CM | POA: Insufficient documentation

## 2018-12-14 DIAGNOSIS — K2980 Duodenitis without bleeding: Secondary | ICD-10-CM

## 2018-12-14 DIAGNOSIS — F17210 Nicotine dependence, cigarettes, uncomplicated: Secondary | ICD-10-CM | POA: Insufficient documentation

## 2018-12-14 HISTORY — PX: COLONOSCOPY WITH PROPOFOL: SHX5780

## 2018-12-14 HISTORY — DX: Presence of dental prosthetic device (complete) (partial): Z97.2

## 2018-12-14 HISTORY — DX: Unspecified asthma, uncomplicated: J45.909

## 2018-12-14 HISTORY — PX: POLYPECTOMY: SHX5525

## 2018-12-14 HISTORY — DX: Unspecified convulsions: R56.9

## 2018-12-14 HISTORY — PX: ESOPHAGOGASTRODUODENOSCOPY (EGD) WITH PROPOFOL: SHX5813

## 2018-12-14 SURGERY — COLONOSCOPY WITH PROPOFOL
Anesthesia: General | Site: Rectum

## 2018-12-14 MED ORDER — PROPOFOL 10 MG/ML IV BOLUS
INTRAVENOUS | Status: DC | PRN
  Administered 2018-12-14: 200 mg via INTRAVENOUS
  Administered 2018-12-14: 100 mg via INTRAVENOUS

## 2018-12-14 MED ORDER — LIDOCAINE HCL (CARDIAC) PF 100 MG/5ML IV SOSY
PREFILLED_SYRINGE | INTRAVENOUS | Status: DC | PRN
  Administered 2018-12-14: 50 mg via INTRAVENOUS

## 2018-12-14 MED ORDER — DEXAMETHASONE SODIUM PHOSPHATE 10 MG/ML IJ SOLN
INTRAMUSCULAR | Status: DC | PRN
  Administered 2018-12-14: 6 mg via INTRAVENOUS

## 2018-12-14 MED ORDER — STERILE WATER FOR IRRIGATION IR SOLN
Status: DC | PRN
  Administered 2018-12-14: 10:00:00 3 mL

## 2018-12-14 MED ORDER — GLYCOPYRROLATE 0.2 MG/ML IJ SOLN
INTRAMUSCULAR | Status: DC | PRN
  Administered 2018-12-14: 0.2 mg via INTRAVENOUS

## 2018-12-14 SURGICAL SUPPLY — 7 items
BLOCK BITE 60FR ADLT L/F GRN (MISCELLANEOUS) ×4 IMPLANT
CANISTER SUCT 1200ML W/VALVE (MISCELLANEOUS) ×4 IMPLANT
FORCEPS BIOP RAD 4 LRG CAP 4 (CUTTING FORCEPS) ×2 IMPLANT
GOWN CVR UNV OPN BCK APRN NK (MISCELLANEOUS) ×4 IMPLANT
GOWN ISOL THUMB LOOP REG UNIV (MISCELLANEOUS) ×4
KIT ENDO PROCEDURE OLY (KITS) ×4 IMPLANT
WATER STERILE IRR 250ML POUR (IV SOLUTION) ×4 IMPLANT

## 2018-12-14 NOTE — Anesthesia Procedure Notes (Signed)
Procedure Name: General with mask airway Performed by: Izetta Dakin, CRNA Pre-anesthesia Checklist: Timeout performed, Patient being monitored, Suction available, Patient identified and Emergency Drugs available Patient Re-evaluated:Patient Re-evaluated prior to induction Oxygen Delivery Method: Nasal cannula

## 2018-12-14 NOTE — Op Note (Signed)
[facility] Gastroenterology Patient Name: Katherine Goodwin Procedure Date: 12/14/2018 9:36 AM MRN: [id number] Account #: [id number] Date of Birth: [birth year] Admit Type: Outpatient Age: 58 Room: [facility] OR ROOM 01 Gender: Female Note Status: Finalized Procedure:            Upper GI endoscopy Indications:          Melena Providers:            Lucilla Lame MD, MD Referring MD:         Halina Maidens, MD (Referring MD) Medicines:            Propofol per Anesthesia Complications:        No immediate complications. Procedure:            Pre-Anesthesia Assessment:                       - Prior to the procedure, a History and Physical was                        performed, and patient medications and allergies were                        reviewed. The patient's tolerance of previous                        anesthesia was also reviewed. The risks and benefits of                        the procedure and the sedation options and risks were                        discussed with the patient. All questions were                        answered, and informed consent was obtained. Prior                        Anticoagulants: The patient has taken no previous                        anticoagulant or antiplatelet agents. ASA Grade                        Assessment: II - A patient with mild systemic disease.                        After reviewing the risks and benefits, the patient was                        deemed in satisfactory condition to undergo the                        procedure.                       After obtaining informed consent, the endoscope was                        passed under direct vision. Throughout the procedure,  the patient's blood pressure, pulse, and oxygen                        saturations were monitored continuously. The was                        introduced through the mouth, and advanced to the                        second part of  duodenum. The upper GI endoscopy was                        accomplished without difficulty. The patient tolerated                        the procedure well. Findings:      The examined esophagus was normal.      Localized mild inflammation characterized by erythema was found in the       gastric antrum. Biopsies were taken with a cold forceps for histology.      Localized mild inflammation characterized by erythema was found in the       duodenal bulb. Impression:           - Normal esophagus.                       - Gastritis. Biopsied.                       - Duodenitis. Recommendation:       - Discharge patient to home.                       - Resume previous diet.                       - Continue present medications.                       - Await pathology results. Procedure Code(s):    --- Professional ---                       [phone], Esophagogastroduodenoscopy, flexible, transoral;                        with biopsy, single or multiple Diagnosis Code(s):    --- Professional ---                       K92.1, Melena (includes Hematochezia)                       K29.80, Duodenitis without bleeding                       K29.70, Gastritis, unspecified, without bleeding CPT copyright 2019 American Medical Association. All rights reserved. The codes documented in this report are preliminary and upon coder review may  be revised to meet current compliance requirements. Lucilla Lame MD, MD 12/14/2018 9:53:07 AM This report has been signed electronically. Number of Addenda: 0 Note Initiated On: 12/14/2018 9:36 AM Total Procedure Duration: 0 hours 3 minutes 1 second  Estimated Blood Loss: Estimated blood loss: none.      [place]  [facility]

## 2018-12-14 NOTE — Interval H&P Note (Signed)
History and Physical Interval Note:  12/14/2018 8:34 AM  Katherine Goodwin  has presented today for surgery, with the diagnosis of melena K92.1.  The various methods of treatment have been discussed with the patient and family. After consideration of risks, benefits and other options for treatment, the patient has consented to  Procedure(s): COLONOSCOPY WITH PROPOFOL (N/A) ESOPHAGOGASTRODUODENOSCOPY (EGD) WITH PROPOFOL (N/A) as a surgical intervention.  The patient's history has been reviewed, patient examined, no change in status, stable for surgery.  I have reviewed the patient's chart and labs.  Questions were answered to the patient's satisfaction.     Kiwan Gadsden [employer]

## 2018-12-14 NOTE — Transfer of Care (Signed)
Immediate Anesthesia Transfer of Care Note  Patient: Katherine Goodwin  Procedure(s) Performed: COLONOSCOPY WITH PROPOFOL (N/A Rectum) ESOPHAGOGASTRODUODENOSCOPY (EGD) WITH BIOPSY (N/A Mouth) POLYPECTOMY (N/A Rectum)  Patient Location: PACU  Anesthesia Type: General  Level of Consciousness: awake, alert  and patient cooperative  Airway and Oxygen Therapy: Patient Spontanous Breathing and Patient connected to supplemental oxygen  Post-op Assessment: Post-op Vital signs reviewed, Patient's Cardiovascular Status Stable, Respiratory Function Stable, Patent Airway and No signs of Nausea or vomiting  Post-op Vital Signs: Reviewed and stable  Complications: No apparent anesthesia complications

## 2018-12-14 NOTE — Anesthesia Preprocedure Evaluation (Signed)
Anesthesia Evaluation  Patient identified by MRN, date of birth, ID band Patient awake    Reviewed: Allergy & Precautions, H&P , NPO status , Patient's Chart, lab work & pertinent test results, reviewed documented beta blocker date and time   Airway Mallampati: II  TM Distance: >3 FB Neck ROM: full    Dental no notable dental hx.    Pulmonary asthma , Current Smoker,    Pulmonary exam normal breath sounds clear to auscultation       Cardiovascular Exercise Tolerance: Good negative cardio ROS Normal cardiovascular exam Rhythm:regular Rate:Normal     Neuro/Psych negative neurological ROS  negative psych ROS   GI/Hepatic Neg liver ROS, GERD  ,  Endo/Other  negative endocrine ROS  Renal/GU negative Renal ROS  negative genitourinary   Musculoskeletal   Abdominal   Peds  Hematology negative hematology ROS (+)   Anesthesia Other Findings   Reproductive/Obstetrics negative OB ROS                             Anesthesia Physical Anesthesia Plan  ASA: II  Anesthesia Plan: General   Post-op Pain Management:    Induction:   PONV Risk Score and Plan:   Airway Management Planned:   Additional Equipment:   Intra-op Plan:   Post-operative Plan:   Informed Consent: I have reviewed the patients History and Physical, chart, labs and discussed the procedure including the risks, benefits and alternatives for the proposed anesthesia with the patient or authorized representative who has indicated his/her understanding and acceptance.     Dental Advisory Given  Plan Discussed with: CRNA and Anesthesiologist  Anesthesia Plan Comments:         Anesthesia Quick Evaluation

## 2018-12-14 NOTE — Op Note (Addendum)
[facility] Gastroenterology Patient Name: Katherine Goodwin Procedure Date: 12/14/2018 9:53 AM MRN: [id number] Account #: [id number] Date of Birth: 1960-10-02 Admit Type: Outpatient Age: 58 Room: [facility] OR ROOM 01 Gender: Female Note Status: Finalized Procedure:            Colonoscopy Indications:          Family history of advanced adenoma of the colon in a                        first-degree relative before age 13 years Providers:            Lucilla Lame MD, MD Medicines:            Propofol per Anesthesia Complications:        No immediate complications. Procedure:            Pre-Anesthesia Assessment:                       - Prior to the procedure, a History and Physical was                        performed, and patient medications and allergies were                        reviewed. The patient's tolerance of previous                        anesthesia was also reviewed. The risks and benefits of                        the procedure and the sedation options and risks were                        discussed with the patient. All questions were                        answered, and informed consent was obtained. Prior                        Anticoagulants: The patient has taken no previous                        anticoagulant or antiplatelet agents. ASA Grade                        Assessment: II - A patient with mild systemic disease.                        After reviewing the risks and benefits, the patient was                        deemed in satisfactory condition to undergo the                        procedure.                       After obtaining informed consent, the colonoscope was  passed under direct vision. Throughout the procedure,                        the patient's blood pressure, pulse, and oxygen                        saturations were monitored continuously. The was                        introduced through the anus and advanced to  the the                        cecum, identified by appendiceal orifice and ileocecal                        valve. The colonoscopy was performed without                        difficulty. The patient tolerated the procedure well.                        The quality of the bowel preparation was excellent. Findings:      The perianal and digital rectal examinations were normal.      A 3 mm polyp was found in the sigmoid colon. The polyp was sessile. The       polyp was removed with a cold biopsy forceps. Resection and retrieval       were complete.      A 3 mm polyp was found in the descending colon. The polyp was sessile.       The polyp was removed with a cold biopsy forceps. Resection and       retrieval were complete.      Non-bleeding internal hemorrhoids were found during retroflexion. The       hemorrhoids were Grade II (internal hemorrhoids that prolapse but reduce       spontaneously). Impression:           - One 3 mm polyp in the sigmoid colon, removed with a                        cold biopsy forceps. Resected and retrieved.                       - One 3 mm polyp in the descending colon, removed with                        a cold biopsy forceps. Resected and retrieved.                       - Non-bleeding internal hemorrhoids. Recommendation:       - Discharge patient to home.                       - Resume previous diet.                       - Continue present medications.                       - Await pathology results.                       -  Repeat colonoscopy in 5 years for surveillance. Procedure Code(s):    --- Professional ---                       [phone], Colonoscopy, flexible; with biopsy, single or                        multiple Diagnosis Code(s):    --- Professional ---                       Z83.71, Family history of colonic polyps                       K63.5, Polyp of colon CPT copyright 2019 American Medical Association. All rights reserved. The codes  documented in this report are preliminary and upon coder review may  be revised to meet current compliance requirements. Lucilla Lame MD, MD 12/14/2018 10:05:51 AM This report has been signed electronically. Number of Addenda: 0 Note Initiated On: 12/14/2018 9:53 AM Scope Withdrawal Time: 0 hours 6 minutes 55 seconds  Total Procedure Duration: 0 hours 9 minutes 4 seconds  Estimated Blood Loss: Estimated blood loss: none.      [facility]

## 2018-12-14 NOTE — Anesthesia Postprocedure Evaluation (Signed)
Anesthesia Post Note  Patient: Katherine Goodwin  Procedure(s) Performed: COLONOSCOPY WITH PROPOFOL (N/A Rectum) ESOPHAGOGASTRODUODENOSCOPY (EGD) WITH BIOPSY (N/A Mouth) POLYPECTOMY (N/A Rectum)  Patient location during evaluation: PACU Anesthesia Type: General Level of consciousness: awake and alert Pain management: pain level controlled Vital Signs Assessment: post-procedure vital signs reviewed and stable Respiratory status: spontaneous breathing, nonlabored ventilation, respiratory function stable and patient connected to nasal cannula oxygen Cardiovascular status: blood pressure returned to baseline and stable Postop Assessment: no apparent nausea or vomiting Anesthetic complications: no    Trecia Rogers

## 2018-12-17 ENCOUNTER — Encounter: Payer: Self-pay | Admitting: Gastroenterology

## 2018-12-18 ENCOUNTER — Encounter: Payer: Self-pay | Admitting: Gastroenterology

## 2018-12-18 ENCOUNTER — Encounter: Payer: Self-pay | Admitting: Internal Medicine

## 2018-12-18 DIAGNOSIS — D126 Benign neoplasm of colon, unspecified: Secondary | ICD-10-CM | POA: Insufficient documentation

## 2019-03-25 ENCOUNTER — Other Ambulatory Visit: Payer: Self-pay

## 2019-03-25 ENCOUNTER — Ambulatory Visit (INDEPENDENT_AMBULATORY_CARE_PROVIDER_SITE_OTHER): Payer: Medicaid Other | Admitting: Internal Medicine

## 2019-03-25 ENCOUNTER — Encounter: Payer: Self-pay | Admitting: Internal Medicine

## 2019-03-25 VITALS — BP 132/84 | HR 91 | Ht 65.0 in | Wt 166.0 lb

## 2019-03-25 DIAGNOSIS — I739 Peripheral vascular disease, unspecified: Secondary | ICD-10-CM | POA: Insufficient documentation

## 2019-03-25 DIAGNOSIS — R600 Localized edema: Secondary | ICD-10-CM | POA: Diagnosis not present

## 2019-03-25 DIAGNOSIS — Z23 Encounter for immunization: Secondary | ICD-10-CM | POA: Diagnosis not present

## 2019-03-25 DIAGNOSIS — Z1159 Encounter for screening for other viral diseases: Secondary | ICD-10-CM | POA: Diagnosis not present

## 2019-03-25 MED ORDER — HYDROCHLOROTHIAZIDE 25 MG PO TABS: 25.0000 mg | ORAL_TABLET | Freq: Every day | ORAL | 0 refills | Status: DC

## 2019-03-25 NOTE — Progress Notes (Signed)
Date:  03/25/2019   Name:  Katherine Goodwin   DOB:  1960/12/11   MRN:  [id number]   Chief Complaint: Edema (Swelling in legs and feet. Started a week ago. Sleeping with feet elevated and its not going away. Has a knot on the side of both feet that is painful. Legs painful due to swelling. Having a hard time walking up stairs, and cannot squat down without help standing back up. Flu Shot.)  Leg Pain  There was no injury mechanism. The pain is present in the right ankle, right foot, left ankle and left foot. The quality of the pain is described as aching and cramping (and swelling). The pain is moderate. The pain has been fluctuating since onset. Pertinent negatives include no numbness. Associated symptoms comments: Having trouble climbing stairs - legs feel heavy; and unable to get up from squatting position. She has tried elevation and NSAIDs for the symptoms. The treatment provided no relief.  She continues to smoke 1/5 ppd.  She had started walking more to increase her circulation but about 3-4 weeks ago she had to stop due to swelling and pain in both feet and ankles.  Review of Systems  Constitutional: Negative for chills, fatigue and fever.  Respiratory: Negative for cough, chest tightness, shortness of breath and wheezing.   Cardiovascular: Positive for leg swelling. Negative for chest pain and palpitations.  Musculoskeletal: Positive for gait problem and myalgias. Negative for arthralgias and back pain.  Neurological: Negative for light-headedness, numbness and headaches.    Patient Active Problem List   Diagnosis Date Noted  . Tubular adenoma of colon 12/18/2018  . Family history of colonic polyps   . Duodenitis   . Gastritis without bleeding   . Melena 12/03/2018  . Increased liver enzymes 12/03/2018  . Tobacco dependence 06/12/2015  . Elevated blood pressure 06/12/2015  . 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 Dizziness 10/17/2013  . Abnormal EKG  10/17/2013  . Back pain 10/17/2013  . Bipolar disorder ([place]) 10/17/2013  . Prolonged QT interval 10/17/2013  . Near syncope 10/17/2013    Allergies  Allergen Reactions  . Prozac [Fluoxetine Hcl] Other (See Comments)    Homicidal and suicidal thoughts  . Seroquel [Quetiapine Fumarate] Other (See Comments)    Homicidal and suicidal thoughts  . Sulfa Antibiotics Nausea And Vomiting    Past Surgical History:  Procedure Laterality Date  . COLONOSCOPY WITH PROPOFOL N/A 12/14/2018   Procedure: COLONOSCOPY WITH PROPOFOL;  Surgeon: Lucilla Lame, MD;  Location: [place];  Service: Endoscopy;  Laterality: N/A;  . ESOPHAGOGASTRODUODENOSCOPY (EGD) WITH PROPOFOL N/A 12/14/2018   Procedure: ESOPHAGOGASTRODUODENOSCOPY (EGD) WITH BIOPSY;  Surgeon: Lucilla Lame, MD;  Location: [place];  Service: Endoscopy;  Laterality: N/A;  . FOOT SURGERY     right foot  . POLYPECTOMY N/A 12/14/2018   Procedure: POLYPECTOMY;  Surgeon: Lucilla Lame, MD;  Location: [place];  Service: Endoscopy;  Laterality: N/A;    Social History   Tobacco Use  . Smoking status: Current Every Day Smoker    Packs/day: 1.50    Years: 39.00    Pack years: 58.50    Types: Cigarettes  . Smokeless tobacco: Never Used  Substance Use Topics  . Alcohol use: Yes    Alcohol/week: 7.0 standard drinks    Types: 3 Cans of beer, 4 Shots of liquor per week    Comment: History of problems with alcohol. She drinks 2 (12 oz.) beer  every night with 2 shots monthly.  . Drug use: Yes    Types: Marijuana    Comment: Smokes Marijuana 2-3 times a month.      Medication list has been reviewed and updated.  Current Meds  Medication Sig  . ibuprofen (ADVIL,MOTRIN) 800 MG tablet Take 800 mg by mouth every 8 (eight) hours as needed.    PHQ 2/9 Scores 03/25/2019  PHQ - 2 Score 1    BP Readings from Last 3 Encounters:  03/25/19 132/84  12/14/18 139/79  11/26/18 (!) 145/92    Physical Exam Vitals signs  and nursing note reviewed.  Constitutional:      General: She is not in acute distress.    Appearance: Normal appearance. She is well-developed.  HENT:     Head: Normocephalic and atraumatic.  Neck:     Vascular: No carotid bruit.  Cardiovascular:     Rate and Rhythm: Normal rate and regular rhythm.     Pulses:          Radial pulses are 1+ on the right side and 1+ on the left side.       Popliteal pulses are 0 on the right side and 0 on the left side.       Dorsalis pedis pulses are 1+ on the right side and 1+ on the left side.       Posterior tibial pulses are 0 on the right side and 0 on the left side.     Heart sounds: No murmur.     Comments: Toes slightly dusky and cool.  Mild pain to palpation. Edema to mid tibia bilaterally.  Pulmonary:     Effort: Pulmonary effort is normal. No respiratory distress.     Breath sounds: Normal breath sounds.  Abdominal:     General: There is no distension.     Palpations: Abdomen is soft. There is no mass.     Tenderness: There is no abdominal tenderness.  Musculoskeletal: Normal range of motion.     Right lower leg: 1+ Edema present.     Left lower leg: 1+ Edema present.  Lymphadenopathy:     Cervical: No cervical adenopathy.  Skin:    General: Skin is warm and dry.     Findings: No rash.     Comments: Callus on lateral foot bilaterally  Neurological:     Mental Status: She is alert and oriented to person, place, and time.     Sensory: Sensation is intact.     Motor: No weakness, tremor or abnormal muscle tone.  Psychiatric:        Attention and Perception: Attention normal.        Mood and Affect: Mood normal.        Behavior: Behavior normal.        Thought Content: Thought content normal.     Wt Readings from Last 3 Encounters:  03/25/19 166 lb (75.3 kg)  12/14/18 164 lb (74.4 kg)  11/26/18 160 lb (72.6 kg)    BP 132/84   Pulse 91   Ht 5\' 5"  (1.651 m)   Wt 166 lb (75.3 kg)   SpO2 96%   BMI 27.62 kg/m   Assessment  and Plan: 1. PVD (peripheral vascular disease) ([place]) Suspect vascular disease - with pain, claudication and edema Recommend elevation, try compression stockings on in AM off in PM Pt is advised that smoking likely playing a large role in her symptoms - Ambulatory referral to Vascular Surgery  2.  Localized edema Elevation and compression stockings Start low dose diuretic - use for several days then only PRN - Comprehensive metabolic panel - TSH + free T4 - hydrochlorothiazide (HYDRODIURIL) 25 MG tablet; Take 1 tablet (25 mg total) by mouth daily.  Dispense: 30 tablet; Refill: 0  3. Need for hepatitis C screening test - Hepatitis C antibody  4. Need for immunization against influenza - Flu Vaccine QUAD 36+ mos IM   Partially dictated using Editor, commissioning. Any errors are unintentional.  Halina Maidens, MD [facility]  03/25/2019

## 2019-03-26 LAB — COMPREHENSIVE METABOLIC PANEL
ALT: 34 IU/L — ABNORMAL HIGH (ref 0–32)
AST: 92 IU/L — ABNORMAL HIGH (ref 0–40)
Albumin/Globulin Ratio: 2 (ref 1.2–2.2)
Albumin: 4.3 g/dL (ref 3.8–4.9)
Alkaline Phosphatase: 125 IU/L — ABNORMAL HIGH (ref 39–117)
BUN/Creatinine Ratio: 14 (ref 9–23)
BUN: 9 mg/dL (ref 6–24)
Bilirubin Total: 1.1 mg/dL (ref 0.0–1.2)
CO2: 24 mmol/L (ref 20–29)
Calcium: 9.3 mg/dL (ref 8.7–10.2)
Chloride: 100 mmol/L (ref 96–106)
Creatinine, Ser: 0.64 mg/dL (ref 0.57–1.00)
GFR calc Af Amer: 115 mL/min/{1.73_m2} (ref >59)
GFR calc non Af Amer: 100 mL/min/{1.73_m2} (ref >59)
Globulin, Total: 2.2 g/dL (ref 1.5–4.5)
Glucose: 121 mg/dL — ABNORMAL HIGH (ref 65–99)
Potassium: 3.5 mmol/L (ref 3.5–5.2)
Sodium: 141 mmol/L (ref 134–144)
Total Protein: 6.5 g/dL (ref 6.0–8.5)

## 2019-03-26 LAB — TSH+FREE T4
Free T4: 1.07 ng/dL (ref 0.82–1.77)
TSH: 3.71 u[IU]/mL (ref 0.450–4.500)

## 2019-03-26 LAB — HEPATITIS C ANTIBODY: Hep C Virus Ab: <0.1 s/co ratio (ref 0.0–0.9)

## 2019-04-01 ENCOUNTER — Other Ambulatory Visit: Payer: Self-pay

## 2019-04-01 ENCOUNTER — Encounter (INDEPENDENT_AMBULATORY_CARE_PROVIDER_SITE_OTHER): Payer: Self-pay | Admitting: Vascular Surgery

## 2019-04-01 ENCOUNTER — Ambulatory Visit (INDEPENDENT_AMBULATORY_CARE_PROVIDER_SITE_OTHER): Payer: Medicaid Other | Admitting: Vascular Surgery

## 2019-04-01 VITALS — BP 125/81 | HR 94 | Resp 18 | Ht 65.0 in | Wt 156.0 lb

## 2019-04-01 DIAGNOSIS — M79604 Pain in right leg: Secondary | ICD-10-CM | POA: Diagnosis not present

## 2019-04-01 DIAGNOSIS — I739 Peripheral vascular disease, unspecified: Secondary | ICD-10-CM

## 2019-04-01 DIAGNOSIS — K2960 Other gastritis without bleeding: Secondary | ICD-10-CM | POA: Diagnosis not present

## 2019-04-01 DIAGNOSIS — I10 Essential (primary) hypertension: Secondary | ICD-10-CM

## 2019-04-01 DIAGNOSIS — M79605 Pain in left leg: Secondary | ICD-10-CM

## 2019-04-01 DIAGNOSIS — I872 Venous insufficiency (chronic) (peripheral): Secondary | ICD-10-CM | POA: Diagnosis not present

## 2019-04-01 NOTE — Progress Notes (Signed)
MRN : [id number]  Katherine Goodwin is a 58 y.o. (1960/10/21) female who presents with chief complaint of No chief complaint on file. Marland Kitchen  History of Present Illness:   The patient is seen for evaluation of painful lower extremities. Patient notes the pain is variable and not always associated with activity.  The pain is somewhat consistent day to day occurring on most days. The patient notes the pain also occurs with standing and routinely seems worse as the day wears on. The pain has been progressive over the past several years. The patient states these symptoms are causing  a profound negative impact on quality of life and daily activities.  The patient denies rest pain or dangling of an extremity off the side of the bed during the night for relief. No open wounds or sores at this time. No history of DVT or phlebitis. No prior interventions or surgeries.  There is a  history of back problems and DJD of the lumbar and sacral spine.    No outpatient medications have been marked as taking for the 04/01/19 encounter (Appointment) with Delana Meyer, Dolores Lory, MD.    Past Medical History:  Diagnosis Date  . Asthma   . GERD (gastroesophageal reflux disease)   . Kidney stones   . Osteoarthrosis, unspecified whether generalized or localized, involving lower leg    leg  . Other bipolar disorders   . Seizure ([place])    none since age 19  . Tobacco abuse   . Trigger finger   . Wears dentures    partial upper    Past Surgical History:  Procedure Laterality Date  . COLONOSCOPY WITH PROPOFOL N/A 12/14/2018   Procedure: COLONOSCOPY WITH PROPOFOL;  Surgeon: Lucilla Lame, MD;  Location: [place];  Service: Endoscopy;  Laterality: N/A;  . ESOPHAGOGASTRODUODENOSCOPY (EGD) WITH PROPOFOL N/A 12/14/2018   Procedure: ESOPHAGOGASTRODUODENOSCOPY (EGD) WITH BIOPSY;  Surgeon: Lucilla Lame, MD;  Location: [place];  Service: Endoscopy;  Laterality: N/A;  . FOOT SURGERY     right foot   . POLYPECTOMY N/A 12/14/2018   Procedure: POLYPECTOMY;  Surgeon: Lucilla Lame, MD;  Location: [place];  Service: Endoscopy;  Laterality: N/A;    Social History Social History   Tobacco Use  . Smoking status: Current Every Day Smoker    Packs/day: 1.50    Years: 39.00    Pack years: 58.50    Types: Cigarettes  . Smokeless tobacco: Never Used  Substance Use Topics  . Alcohol use: Yes    Alcohol/week: 7.0 standard drinks    Types: 3 Cans of beer, 4 Shots of liquor per week    Comment: History of problems with alcohol. She drinks 2 (12 oz.) beer every night with 2 shots monthly.  . Drug use: Yes    Types: Marijuana    Comment: Smokes Marijuana 2-3 times a month.     Family History Family History  Problem Relation Age of Onset  . Heart disease Father 71       CABG x 3  . Heart attack Father 10       MI  . Hypertension Mother   . Throat cancer Brother   No family history of bleeding/clotting disorders, porphyria or autoimmune disease   Allergies  Allergen Reactions  . Prozac [Fluoxetine Hcl] Other (See Comments)    Homicidal and suicidal thoughts  . Seroquel [Quetiapine Fumarate] Other (See Comments)    Homicidal and suicidal thoughts  . Sulfa Antibiotics Nausea And  Vomiting     REVIEW OF SYSTEMS (Negative unless checked)  Constitutional: [] Weight loss  [] Fever  [] Chills Cardiac: [] Chest pain   [] Chest pressure   [] Palpitations   [] Shortness of breath when laying flat   [] Shortness of breath with exertion. Vascular:  [x] Pain in legs with walking   [x] Pain in legs at rest  [] History of DVT   [] Phlebitis   [x] Swelling in legs   [] Varicose veins   [] Non-healing ulcers Pulmonary:   [] Uses home oxygen   [] Productive cough   [] Hemoptysis   [] Wheeze  [] COPD   [] Asthma Neurologic:  [] Dizziness   [] Seizures   [] History of stroke   [] History of TIA  [] Aphasia   [] Vissual changes   [] Weakness or numbness in arm   [] Weakness or numbness in leg Musculoskeletal:   [] Joint  swelling   [] Joint pain   [] Low back pain Hematologic:  [] Easy bruising  [] Easy bleeding   [] Hypercoagulable state   [] Anemic Gastrointestinal:  [] Diarrhea   [] Vomiting  [x] Gastroesophageal reflux/heartburn   [] Difficulty swallowing. Genitourinary:  [] Chronic kidney disease   [] Difficult urination  [] Frequent urination   [] Blood in urine Skin:  [] Rashes   [] Ulcers  Psychological:  [] History of anxiety   []  History of major depression.  Physical Examination  There were no vitals filed for this visit. There is no height or weight on file to calculate BMI. Gen: WD/WN, NAD Head: [place]/AT, No temporalis wasting.  Ear/Nose/Throat: Hearing grossly intact, nares w/o erythema or drainage, poor dentition Eyes: PER, EOMI, sclera nonicteric.  Neck: Supple, no masses.  No bruit or JVD.  Pulmonary:  Good air movement, clear to auscultation bilaterally, no use of accessory muscles.  Cardiac: RRR, normal S1, S2, no Murmurs. Vascular: scattered varicosities present bilaterally.  Moderate venous stasis changes to the legs bilaterally.  2-3+ soft pitting edema. Vessel Right Left  Radial Palpable Palpable  PT Not Palpable Not Palpable  DP Not Palpable Not Palpable  Gastrointestinal: soft, non-distended. No guarding/no peritoneal signs.  Musculoskeletal: M/S 5/5 throughout.  No deformity or atrophy.  Neurologic: CN 2-12 intact. Pain and light touch intact in extremities.  Symmetrical.  Speech is fluent. Motor exam as listed above. Psychiatric: Judgment intact, Mood & affect appropriate for pt's clinical situation. Dermatologic: venous rashes no ulcers noted.  No changes consistent with cellulitis. Lymph : No Cervical lymphadenopathy, no lichenification or skin changes of chronic lymphedema.  CBC Lab Results  Component Value Date   WBC 5.6 11/26/2018   HGB 14.8 11/26/2018   HCT 43.5 11/26/2018   MCV 98.4 11/26/2018   PLT 157 11/26/2018    BMET    Component Value Date/Time   NA 141 03/25/2019 1636    NA 138 09/04/2013 1953   K 3.5 03/25/2019 1636   K 3.3 (L) 09/04/2013 1953   CL 100 03/25/2019 1636   CL 104 09/04/2013 1953   CO2 24 03/25/2019 1636   CO2 28 09/04/2013 1953   GLUCOSE 121 (H) 03/25/2019 1636   GLUCOSE 93 11/26/2018 1722   GLUCOSE 90 09/04/2013 1953   BUN 9 03/25/2019 1636   BUN 18 09/04/2013 1953   CREATININE 0.64 03/25/2019 1636   CREATININE 0.65 09/04/2013 1953   CALCIUM 9.3 03/25/2019 1636   CALCIUM 9.2 09/04/2013 1953   GFRNONAA 100 03/25/2019 1636   GFRNONAA >60 09/04/2013 1953   GFRAA 115 03/25/2019 1636   GFRAA >60 09/04/2013 1953   Estimated Creatinine Clearance: 79.7 mL/min (by C-G formula based on SCr of 0.64 mg/dL).  COAG No  results found for: INR, PROTIME  Radiology No results found.   Assessment/Plan 1. Pain in both lower extremities  Recommend:  The patient has atypical pain symptoms for pure atherosclerotic disease. However, on physical exam there is evidence of mixed venous and arterial disease, given the diminished pulses and the edema associated with venous changes of the legs.  Noninvasive studies including ABI's and venous ultrasound of the legs will be obtained and the patient will follow up with me to review these studies.  The patient should continue walking and begin a more formal exercise program. The patient should continue his antiplatelet therapy and aggressive treatment of the lipid abnormalities.  The patient should begin wearing graduated compression socks 15-20 mmHg strength to control edema.  - VAS [place] ABI WITH/WO TBI; Future - VAS [place] LOWER EXTREMITY VENOUS REFLUX; Future  2. PAD (peripheral artery disease) (HCC)  Recommend:  The patient has atypical pain symptoms for pure atherosclerotic disease. However, on physical exam there is evidence of mixed venous and arterial disease, given the diminished pulses and the edema associated with venous changes of the legs.  Noninvasive studies including ABI's and venous  ultrasound of the legs will be obtained and the patient will follow up with me to review these studies.  The patient should continue walking and begin a more formal exercise program. The patient should continue his antiplatelet therapy and aggressive treatment of the lipid abnormalities.  The patient should begin wearing graduated compression socks 15-20 mmHg strength to control edema.  - VAS [place] ABI WITH/WO TBI; Future  3. Chronic venous insufficiency  Recommend:  The patient has atypical pain symptoms for pure atherosclerotic disease. However, on physical exam there is evidence of mixed venous and arterial disease, given the diminished pulses and the edema associated with venous changes of the legs.  Noninvasive studies including ABI's and venous ultrasound of the legs will be obtained and the patient will follow up with me to review these studies.  The patient should continue walking and begin a more formal exercise program. The patient should continue his antiplatelet therapy and aggressive treatment of the lipid abnormalities.  The patient should begin wearing graduated compression socks 15-20 mmHg strength to control edema.  - VAS [place] LOWER EXTREMITY VENOUS REFLUX; Future  4. Other gastritis without hemorrhage, unspecified chronicity Continue PPI as already ordered, this medication has been reviewed and there are no changes at this time.  Avoidence of caffeine and alcohol  Moderate elevation of the head of the bed   5. Essential hypertension Continue antihypertensive medications as already ordered, these medications have been reviewed and there are no changes at this time.     Hortencia Pilar, MD  04/01/2019 1:18 PM

## 2019-04-04 ENCOUNTER — Encounter (INDEPENDENT_AMBULATORY_CARE_PROVIDER_SITE_OTHER): Payer: Self-pay | Admitting: Vascular Surgery

## 2019-04-04 DIAGNOSIS — M79606 Pain in leg, unspecified: Secondary | ICD-10-CM | POA: Insufficient documentation

## 2019-04-04 DIAGNOSIS — I872 Venous insufficiency (chronic) (peripheral): Secondary | ICD-10-CM | POA: Insufficient documentation

## 2019-04-04 DIAGNOSIS — I10 Essential (primary) hypertension: Secondary | ICD-10-CM | POA: Insufficient documentation

## 2019-04-12 ENCOUNTER — Ambulatory Visit (INDEPENDENT_AMBULATORY_CARE_PROVIDER_SITE_OTHER): Payer: Medicaid Other | Admitting: Nurse Practitioner

## 2019-04-12 ENCOUNTER — Ambulatory Visit (INDEPENDENT_AMBULATORY_CARE_PROVIDER_SITE_OTHER): Payer: Medicaid Other

## 2019-04-12 ENCOUNTER — Other Ambulatory Visit: Payer: Self-pay

## 2019-04-12 ENCOUNTER — Encounter (INDEPENDENT_AMBULATORY_CARE_PROVIDER_SITE_OTHER): Payer: Self-pay | Admitting: Nurse Practitioner

## 2019-04-12 VITALS — BP 120/82 | HR 86 | Resp 16 | Wt 162.2 lb

## 2019-04-12 DIAGNOSIS — I872 Venous insufficiency (chronic) (peripheral): Secondary | ICD-10-CM

## 2019-04-12 DIAGNOSIS — I739 Peripheral vascular disease, unspecified: Secondary | ICD-10-CM | POA: Diagnosis not present

## 2019-04-12 DIAGNOSIS — R600 Localized edema: Secondary | ICD-10-CM

## 2019-04-12 DIAGNOSIS — M79604 Pain in right leg: Secondary | ICD-10-CM

## 2019-04-12 DIAGNOSIS — M79605 Pain in left leg: Secondary | ICD-10-CM

## 2019-04-12 DIAGNOSIS — F17200 Nicotine dependence, unspecified, uncomplicated: Secondary | ICD-10-CM | POA: Diagnosis not present

## 2019-04-12 DIAGNOSIS — I10 Essential (primary) hypertension: Secondary | ICD-10-CM | POA: Diagnosis not present

## 2019-04-16 ENCOUNTER — Encounter (INDEPENDENT_AMBULATORY_CARE_PROVIDER_SITE_OTHER): Payer: Self-pay | Admitting: Nurse Practitioner

## 2019-04-16 NOTE — Progress Notes (Signed)
SUBJECTIVE:  [name], female    DOB: 13-Sep-1960, 58 y.o.   MRN: [id number] Chief Complaint  Patient presents with  . Follow-up    ultrasound follow up    HPI  Katherine Goodwin is a 58 y.o. female  The patient is seen for evaluation of painful lower extremities. Patient notes the pain is variable and not always associated with activity.  The pain is somewhat consistent day to day occurring on most days. The patient notes the pain also occurs with standing and routinely seems worse as the day wears on. The pain has been progressive over the past several years. The patient states these symptoms are causing  a profound negative impact on quality of life and daily activities.  The patient denies rest pain or dangling of an extremity off the side of the bed during the night for relief. No open wounds or sores at this time. No history of DVT or phlebitis. No prior interventions or surgeries.  There is a  history of back problems and DJD of the lumbar and sacral spine.   Today the patient had noninvasive studies.  The bilateral venous reflux revealed no evidence of chronic venous insufficiency in her bilateral lower extremities.  No evidence of DVT or superficial venous thrombosis bilaterally.  There also appears to be a Baker's cyst behind the right knee measuring 1.67 cm x 1.38 cm.  Patient also underwent bilateral ABIs.  The right ABI is 1.20 with a TBI of 0.96.  The left ABI is 1.09 with a TBI of 1.00.  The bilateral tibial arteries have triphasic waveforms with strong toe waveforms bilaterally.  Past Medical History:  Diagnosis Date  . Asthma   . GERD (gastroesophageal reflux disease)   . Kidney stones   . Osteoarthrosis, unspecified whether generalized or localized, involving lower leg    leg  . Other bipolar disorders   . Seizure ([place])    none since age 52  . Tobacco abuse   . Trigger finger   . Wears dentures    partial upper    Past Surgical History:   Procedure Laterality Date  . COLONOSCOPY WITH PROPOFOL N/A 12/14/2018   Procedure: COLONOSCOPY WITH PROPOFOL;  Surgeon: Lucilla Lame, MD;  Location: [facility];  Service: Endoscopy;  Laterality: N/A;  . ESOPHAGOGASTRODUODENOSCOPY (EGD) WITH PROPOFOL N/A 12/14/2018   Procedure: ESOPHAGOGASTRODUODENOSCOPY (EGD) WITH BIOPSY;  Surgeon: Lucilla Lame, MD;  Location: [facility];  Service: Endoscopy;  Laterality: N/A;  . FOOT SURGERY     right foot  . POLYPECTOMY N/A 12/14/2018   Procedure: POLYPECTOMY;  Surgeon: Lucilla Lame, MD;  Location: [facility];  Service: Endoscopy;  Laterality: N/A;    Social History   Socioeconomic History  . Marital status: Married    Spouse name: Not on file  . Number of children: Not on file  . Years of education: Not on file  . Highest education level: Not on file  Occupational History  . Not on file  Social Needs  . Financial resource strain: Not on file  . Food insecurity    Worry: Not on file    Inability: Not on file  . Transportation needs    Medical: Not on file    Non-medical: Not on file  Tobacco Use  . Smoking status: Current Every Day Smoker    Packs/day: 1.50    Years: 39.00    Pack years: 58.50    Types: Cigarettes  . Smokeless  tobacco: Never Used  Substance and Sexual Activity  . Alcohol use: Yes    Alcohol/week: 7.0 standard drinks    Types: 3 Cans of beer, 4 Shots of liquor per week    Comment: History of problems with alcohol. She drinks 2 (12 oz.) beer every night with 2 shots monthly.  . Drug use: Yes    Types: Marijuana    Comment: Smokes Marijuana 2-3 times a month.   . Sexual activity: Not on file  Lifestyle  . Physical activity    Days per week: Not on file    Minutes per session: Not on file  . Stress: Not on file  Relationships  . Social Herbalist on phone: Not on file    Gets together: Not on file    Attends religious service: Not on file    Active member of club or organization:  Not on file    Attends meetings of clubs or organizations: Not on file    Relationship status: Not on file  . Intimate partner violence    Fear of current or ex partner: Not on file    Emotionally abused: Not on file    Physically abused: Not on file    Forced sexual activity: Not on file  Other Topics Concern  . Not on file  Social History Narrative  . Not on file    Family History  Problem Relation Age of Onset  . Heart disease Father 31       CABG x 3  . Heart attack Father 30       MI  . Hypertension Mother   . Throat cancer Brother     Allergies  Allergen Reactions  . Prozac [Fluoxetine Hcl] Other (See Comments)    Homicidal and suicidal thoughts  . Seroquel [Quetiapine Fumarate] Other (See Comments)    Homicidal and suicidal thoughts  . Sulfa Antibiotics Nausea And Vomiting     Review of Systems   Review of Systems: Negative Unless Checked Constitutional: [] Weight loss  [] Fever  [] Chills Cardiac: [] Chest pain   []  Atrial Fibrillation  [] Palpitations   [] Shortness of breath when laying flat   [] Shortness of breath with exertion. [] Shortness of breath at rest Vascular:  [x] Pain in legs with walking   [x] Pain in legs with standing [] Pain in legs when laying flat   [] Claudication    [] Pain in feet when laying flat    [] History of DVT   [] Phlebitis   [x] Swelling in legs   [] Varicose veins   [] Non-healing ulcers Pulmonary:   [] Uses home oxygen   [] Productive cough   [] Hemoptysis   [] Wheeze  [] COPD   [] Asthma Neurologic:  [] Dizziness   [] Seizures  [] Blackouts [] History of stroke   [] History of TIA  [] Aphasia   [] Temporary Blindness   [] Weakness or numbness in arm   [] Weakness or numbness in leg Musculoskeletal:   [] Joint swelling   [] Joint pain   [] Low back pain  []  History of Knee Replacement [] Arthritis [] back Surgeries  []  Spinal Stenosis    Hematologic:  [] Easy bruising  [] Easy bleeding   [] Hypercoagulable state   [] Anemic Gastrointestinal:  [] Diarrhea   [] Vomiting   [x] Gastroesophageal reflux/heartburn   [] Difficulty swallowing. [] Abdominal pain Genitourinary:  [] Chronic kidney disease   [] Difficult urination  [] Anuric   [] Blood in urine [] Frequent urination  [] Burning with urination   [] Hematuria Skin:  [] Rashes   [] Ulcers [] Wounds Psychological:  [] History of anxiety   []  History of major depression  []   Memory Difficulties      OBJECTIVE:   Physical Exam  BP 120/82 (BP Location: Right Arm)   Pulse 86   Resp 16   Wt 162 lb 3.2 oz (73.6 kg)   BMI 26.99 kg/m   Gen: WD/WN, NAD Head: [place]/AT, No temporalis wasting.  Ear/Nose/Throat: Hearing grossly intact, nares w/o erythema or drainage Eyes: PER, EOMI, sclera nonicteric.  Neck: Supple, no masses.  No JVD.  Pulmonary:  Good air movement, no use of accessory muscles.  Cardiac: RRR Vascular: 1+ soft edema  Vessel Right Left  Radial Palpable Palpable  Dorsalis Pedis Not Palpable Not Palpable  Posterior Tibial Not Palpable Not Palpable   Gastrointestinal: soft, non-distended. No guarding/no peritoneal signs.  Musculoskeletal: M/S 5/5 throughout.  No deformity or atrophy.  Neurologic: Pain and light touch intact in extremities.  Symmetrical.  Speech is fluent. Motor exam as listed above. Psychiatric: Judgment intact, Mood & affect appropriate for pt's clinical situation. Dermatologic: bilateral Venous rashes. No Ulcers Noted.  No changes consistent with cellulitis. Lymph : No Cervical lymphadenopathy, no lichenification or skin changes of chronic lymphedema.       ASSESSMENT AND PLAN:  1. Pain in both lower extremities Recommend:  I do not find evidence of Vascular pathology that would explain the patient's symptoms  The patient has atypical pain symptoms for vascular disease  I do not find evidence of Vascular pathology that would explain the patient's symptoms and I suspect the patient is c/o pseudoclaudication.  Patient should have an evaluation of his LS spine which I defer to the  primary service.  Noninvasive studies including venous ultrasound of the legs do not identify vascular problems  The patient should continue walking and begin a more formal exercise program. The patient should continue his antiplatelet therapy and aggressive treatment of the lipid abnormalities. The patient should begin wearing graduated compression socks 15-20 mmHg strength to control her mild edema.  Patient will follow-up with me on a PRN basis  Further work-up of her lower extremity pain is deferred to the primary service     2. Localized edema No surgery or intervention at this point in time.  I have reviewed my discussion with the patient regarding venous insufficiency and why it causes symptoms. I have discussed with the patient the chronic skin changes that accompany venous insufficiency and the long term sequela such as ulceration. Patient will contnue wearing graduated compression stockings on a daily basis, as this has provided excellent control of his edema. The patient will put the stockings on first thing in the morning and removing them in the evening. The patient is reminded not to sleep in the stockings.  In addition, behavioral modification including elevation during the day will be initiated. Exercise is strongly encouraged.  Duplex ultrasound of the bilateral lower extremities shows no evidence of chronic venous insufficiency   The patient will follow up with me PRN should anything change.  The patient voices agreement with this plan.   3. Essential hypertension Continue antihypertensive medications as already ordered, these medications have been reviewed and there are no changes at this time.   4. Tobacco dependence Smoking cessation was discussed, 3-10 minutes spent on this topic specifically    Current Outpatient Medications on File Prior to Visit  Medication Sig Dispense Refill  . diphenhydrAMINE (BENADRYL) 25 MG tablet Take 25 mg by mouth every 6 (six)  hours as needed.    Marland Kitchen ibuprofen (ADVIL,MOTRIN) 800 MG tablet Take 800 mg by mouth every  8 (eight) hours as needed.    . hydrochlorothiazide (HYDRODIURIL) 25 MG tablet Take 1 tablet (25 mg total) by mouth daily. (Patient not taking: Reported on 04/01/2019) 30 tablet 0   No current facility-administered medications on file prior to visit.     There are no Patient Instructions on file for this visit. No follow-ups on file.   Kris Hartmann, NP  This note was completed with Sales executive.  Any errors are purely unintentional.

## 2019-06-26 ENCOUNTER — Telehealth: Payer: Self-pay

## 2019-06-26 NOTE — Telephone Encounter (Signed)
Pt called saying she was told from a friend that since she gets food stamps she can call her PCP and get [place] to sign her up for superSNAP- which is a additional program for those with food stamps to be able to get $40 in fruits and vegetables from [facility].   Informed her we do not know how to do this, and have never heard of this but to contact her social services to find out what needs to be done to get this opportunity.

## 2019-07-15 ENCOUNTER — Encounter: Payer: Medicaid Other | Admitting: Internal Medicine

## 2019-07-16 ENCOUNTER — Ambulatory Visit: Payer: Medicaid Other | Admitting: Internal Medicine

## 2019-07-17 ENCOUNTER — Other Ambulatory Visit: Payer: Self-pay

## 2019-07-17 ENCOUNTER — Ambulatory Visit (INDEPENDENT_AMBULATORY_CARE_PROVIDER_SITE_OTHER): Payer: Medicaid Other | Admitting: Internal Medicine

## 2019-07-17 ENCOUNTER — Other Ambulatory Visit (HOSPITAL_COMMUNITY)
Admission: RE | Admit: 2019-07-17 | Discharge: 2019-07-17 | Disposition: A | Discharge status: Home / Self Care | Payer: Medicaid Other | Source: Ambulatory Visit | Attending: Internal Medicine | Admitting: Internal Medicine

## 2019-07-17 ENCOUNTER — Encounter: Payer: Self-pay | Admitting: Internal Medicine

## 2019-07-17 VITALS — BP 154/98 | HR 87 | Temp 98.3°F | Ht 65.0 in | Wt 164.0 lb

## 2019-07-17 DIAGNOSIS — Z124 Encounter for screening for malignant neoplasm of cervix: Secondary | ICD-10-CM | POA: Diagnosis not present

## 2019-07-17 DIAGNOSIS — I10 Essential (primary) hypertension: Secondary | ICD-10-CM | POA: Diagnosis not present

## 2019-07-17 DIAGNOSIS — Z0000 Encounter for general adult medical examination without abnormal findings: Secondary | ICD-10-CM | POA: Diagnosis not present

## 2019-07-17 DIAGNOSIS — Z1231 Encounter for screening mammogram for malignant neoplasm of breast: Secondary | ICD-10-CM

## 2019-07-17 DIAGNOSIS — H109 Unspecified conjunctivitis: Secondary | ICD-10-CM

## 2019-07-17 DIAGNOSIS — Z23 Encounter for immunization: Secondary | ICD-10-CM | POA: Diagnosis not present

## 2019-07-17 LAB — POCT URINALYSIS DIPSTICK
Bilirubin, UA: NEGATIVE
Blood, UA: NEGATIVE
Glucose, UA: NEGATIVE
Ketones, UA: NEGATIVE
Leukocytes, UA: NEGATIVE
Nitrite, UA: NEGATIVE
Protein, UA: POSITIVE — AB
Spec Grav, UA: 1.025 (ref 1.010–1.025)
Urobilinogen, UA: 0.2 E.U./dL
pH, UA: 6 (ref 5.0–8.0)

## 2019-07-17 MED ORDER — LOSARTAN POTASSIUM 50 MG PO TABS: 50.0000 mg | ORAL_TABLET | Freq: Every day | ORAL | 0 refills | Status: DC

## 2019-07-17 MED ORDER — NEOMYCIN-POLYMYXIN-DEXAMETH 3.5-10000-0.1 OP SUSP: 2.0000 [drp] | Freq: Four times a day (QID) | OPHTHALMIC | 0 refills | Status: AC

## 2019-07-17 NOTE — Patient Instructions (Signed)
DASH Eating Plan DASH stands for "Dietary Approaches to Stop Hypertension." The DASH eating plan is a healthy eating plan that has been shown to reduce high blood pressure (hypertension). It may also reduce your risk for type 2 diabetes, heart disease, and stroke. The DASH eating plan may also help with weight loss. What are tips for following this plan?  General guidelines  Avoid eating more than 2,300 mg (milligrams) of salt (sodium) a day. If you have hypertension, you may need to reduce your sodium intake to 1,500 mg a day.  Limit alcohol intake to no more than 1 drink a day for nonpregnant women and 2 drinks a day for men. One drink equals 12 oz of beer, 5 oz of wine, or 1 oz of hard liquor.  Work with your health care provider to maintain a healthy body weight or to lose weight. Ask what an ideal weight is for you.  Get at least 30 minutes of exercise that causes your heart to beat faster (aerobic exercise) most days of the week. Activities may include walking, swimming, or biking.  Work with your health care provider or diet and nutrition specialist (dietitian) to adjust your eating plan to your individual calorie needs. Reading food labels   Check food labels for the amount of sodium per serving. Choose foods with less than 5 percent of the Daily Value of sodium. Generally, foods with less than 300 mg of sodium per serving fit into this eating plan.  To find whole grains, look for the word "whole" as the first word in the ingredient list. Shopping  Buy products labeled as "low-sodium" or "no salt added."  Buy fresh foods. Avoid canned foods and premade or frozen meals. Cooking  Avoid adding salt when cooking. Use salt-free seasonings or herbs instead of table salt or sea salt. Check with your health care provider or pharmacist before using salt substitutes.  Do not fry foods. Cook foods using healthy methods such as baking, boiling, grilling, and broiling instead.  Cook with  heart-healthy oils, such as olive, canola, soybean, or sunflower oil. Meal planning  Eat a balanced diet that includes: ? 5 or more servings of fruits and vegetables each day. At each meal, try to fill half of your plate with fruits and vegetables. ? Up to 6-8 servings of whole grains each day. ? Less than 6 oz of lean meat, poultry, or fish each day. A 3-oz serving of meat is about the same size as a deck of cards. One egg equals 1 oz. ? 2 servings of low-fat dairy each day. ? A serving of nuts, seeds, or beans 5 times each week. ? Heart-healthy fats. Healthy fats called Omega-3 fatty acids are found in foods such as flaxseeds and coldwater fish, like sardines, salmon, and mackerel.  Limit how much you eat of the following: ? Canned or prepackaged foods. ? Food that is high in trans fat, such as fried foods. ? Food that is high in saturated fat, such as fatty meat. ? Sweets, desserts, sugary drinks, and other foods with added sugar. ? Full-fat dairy products.  Do not salt foods before eating.  Try to eat at least 2 vegetarian meals each week.  Eat more home-cooked food and less restaurant, buffet, and fast food.  When eating at a restaurant, ask that your food be prepared with less salt or no salt, if possible. What foods are recommended? The items listed may not be a complete list. Talk with your dietitian about   what dietary choices are best for you. Grains Whole-grain or whole-wheat bread. Whole-grain or whole-wheat pasta. Brown rice. Oatmeal. Quinoa. Bulgur. Whole-grain and low-sodium cereals. Pita bread. Low-fat, low-sodium crackers. Whole-wheat flour tortillas. Vegetables Fresh or frozen vegetables (raw, steamed, roasted, or grilled). Low-sodium or reduced-sodium tomato and vegetable juice. Low-sodium or reduced-sodium tomato sauce and tomato paste. Low-sodium or reduced-sodium canned vegetables. Fruits All fresh, dried, or frozen fruit. Canned fruit in natural juice (without  added sugar). Meat and other protein foods Skinless chicken or turkey. Ground chicken or turkey. Pork with fat trimmed off. Fish and seafood. Egg whites. Dried beans, peas, or lentils. Unsalted nuts, nut butters, and seeds. Unsalted canned beans. Lean cuts of beef with fat trimmed off. Low-sodium, lean deli meat. Dairy Low-fat (1%) or fat-free (skim) milk. Fat-free, low-fat, or reduced-fat cheeses. Nonfat, low-sodium ricotta or cottage cheese. Low-fat or nonfat yogurt. Low-fat, low-sodium cheese. Fats and oils Soft margarine without trans fats. Vegetable oil. Low-fat, reduced-fat, or light mayonnaise and salad dressings (reduced-sodium). Canola, safflower, olive, soybean, and sunflower oils. Avocado. Seasoning and other foods Herbs. Spices. Seasoning mixes without salt. Unsalted popcorn and pretzels. Fat-free sweets. What foods are not recommended? The items listed may not be a complete list. Talk with your dietitian about what dietary choices are best for you. Grains Baked goods made with fat, such as croissants, muffins, or some breads. Dry pasta or rice meal packs. Vegetables Creamed or fried vegetables. Vegetables in a cheese sauce. Regular canned vegetables (not low-sodium or reduced-sodium). Regular canned tomato sauce and paste (not low-sodium or reduced-sodium). Regular tomato and vegetable juice (not low-sodium or reduced-sodium). Pickles. Olives. Fruits Canned fruit in a light or heavy syrup. Fried fruit. Fruit in cream or butter sauce. Meat and other protein foods Fatty cuts of meat. Ribs. Fried meat. Bacon. Sausage. Bologna and other processed lunch meats. Salami. Fatback. Hotdogs. Bratwurst. Salted nuts and seeds. Canned beans with added salt. Canned or smoked fish. Whole eggs or egg yolks. Chicken or turkey with skin. Dairy Whole or 2% milk, cream, and half-and-half. Whole or full-fat cream cheese. Whole-fat or sweetened yogurt. Full-fat cheese. Nondairy creamers. Whipped toppings.  Processed cheese and cheese spreads. Fats and oils Butter. Stick margarine. Lard. Shortening. Ghee. Bacon fat. Tropical oils, such as coconut, palm kernel, or palm oil. Seasoning and other foods Salted popcorn and pretzels. Onion salt, garlic salt, seasoned salt, table salt, and sea salt. Worcestershire sauce. Tartar sauce. Barbecue sauce. Teriyaki sauce. Soy sauce, including reduced-sodium. Steak sauce. Canned and packaged gravies. Fish sauce. Oyster sauce. Cocktail sauce. Horseradish that you find on the shelf. Ketchup. Mustard. Meat flavorings and tenderizers. Bouillon cubes. Hot sauce and Tabasco sauce. Premade or packaged marinades. Premade or packaged taco seasonings. Relishes. Regular salad dressings. Where to find more information:  National Heart, Lung, and Blood Institute: www.nhlbi.nih.gov  American Heart Association: www.heart.org Summary  The DASH eating plan is a healthy eating plan that has been shown to reduce high blood pressure (hypertension). It may also reduce your risk for type 2 diabetes, heart disease, and stroke.  With the DASH eating plan, you should limit salt (sodium) intake to 2,300 mg a day. If you have hypertension, you may need to reduce your sodium intake to 1,500 mg a day.  When on the DASH eating plan, aim to eat more fresh fruits and vegetables, whole grains, lean proteins, low-fat dairy, and heart-healthy fats.  Work with your health care provider or diet and nutrition specialist (dietitian) to adjust your eating plan to your   individual calorie needs. This information is not intended to replace advice given to you by your health care provider. Make sure you discuss any questions you have with your health care provider. Document Revised: 05/19/2017 Document Reviewed: 05/30/2016 Elsevier Patient Education  2020 [employer]  

## 2019-07-17 NOTE — Progress Notes (Signed)
Date:  07/17/2019   Name:  Katherine Goodwin   DOB:  1961/05/04   MRN:  [id number]   Chief Complaint: Annual Exam (Breast exam and papsmear.) Katherine Goodwin is a 59 y.o. female who presents today for her Complete Annual Exam. She feels well. She reports exercising none. She reports she is sleeping well. She denies breast issues.  Mammogram - none Pap - 2013 Colonoscopy  11/2018 Immunization History  Administered Date(s) Administered  . Influenza, Seasonal, Injecte, Preservative Fre 04/10/2008  . Influenza,inj,Quad PF,6+ Mos 03/25/2019    Conjunctivitis  Associated symptoms include eye discharge (running yellow matting). Pertinent negatives include no fever, no abdominal pain, no constipation, no diarrhea, no vomiting, no congestion, no headaches, no hearing loss, no cough, no wheezing and no rash.  Hypertension This is a new (never treated for HTN.  But several visits over the past year have noted high readings.  all of her immediate family has HTN.) problem. The problem is unchanged. The problem is uncontrolled. Associated symptoms include shortness of breath. Pertinent negatives include no chest pain, headaches or palpitations. Past treatments include nothing.    Lab Results  Component Value Date   CREATININE 0.64 03/25/2019   BUN 9 03/25/2019   NA 141 03/25/2019   K 3.5 03/25/2019   CL 100 03/25/2019   CO2 24 03/25/2019   No results found for: CHOL, HDL, LDLCALC, LDLDIRECT, TRIG, CHOLHDL Lab Results  Component Value Date   TSH 3.710 03/25/2019   No results found for: HGBA1C   Review of Systems  Constitutional: Negative for chills, fatigue and fever.  HENT: Negative for congestion, hearing loss, tinnitus, trouble swallowing and voice change.   Eyes: Positive for discharge (running yellow matting). Negative for visual disturbance.  Respiratory: Positive for shortness of breath. Negative for cough, chest tightness and wheezing.   Cardiovascular: Negative for chest pain,  palpitations and leg swelling.  Gastrointestinal: Negative for abdominal pain, constipation, diarrhea and vomiting.  Endocrine: Negative for polydipsia and polyuria.  Genitourinary: Negative for dysuria, frequency, genital sores, vaginal bleeding and vaginal discharge.  Musculoskeletal: Negative for arthralgias, gait problem and joint swelling.  Skin: Negative for color change and rash.  Neurological: Negative for dizziness, tremors, light-headedness and headaches.  Hematological: Negative for adenopathy. Does not bruise/bleed easily.  Psychiatric/Behavioral: Negative for dysphoric mood and sleep disturbance. The patient is not nervous/anxious.     Patient Active Problem List   Diagnosis Date Noted  . Chronic venous insufficiency 04/04/2019  . Leg pain 04/04/2019  . Essential hypertension 04/04/2019  . PAD (peripheral artery disease) ([place]) 03/25/2019  . Localized edema 03/25/2019  . Tubular adenoma of colon 12/18/2018  . Family history of colonic polyps   . Duodenitis   . Gastritis without bleeding   . Melena 12/03/2018  . Increased liver enzymes 12/03/2018  . Tobacco dependence 06/12/2015  . Elevated blood pressure 06/12/2015  . 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 Dizziness 10/17/2013  . Abnormal EKG 10/17/2013  . Back pain 10/17/2013  . Bipolar disorder ([place]) 10/17/2013  . Prolonged QT interval 10/17/2013  . Near syncope 10/17/2013    Allergies  Allergen Reactions  . Prozac [Fluoxetine Hcl] Other (See Comments)    Homicidal and suicidal thoughts  . Seroquel [Quetiapine Fumarate] Other (See Comments)    Homicidal and suicidal thoughts  . Sulfa Antibiotics Nausea And Vomiting    Past Surgical History:  Procedure Laterality Date  . COLONOSCOPY WITH PROPOFOL N/A 12/14/2018  Procedure: COLONOSCOPY WITH PROPOFOL;  Surgeon: Lucilla Lame, MD;  Location: [facility];  Service: Endoscopy;  Laterality: N/A;  . ESOPHAGOGASTRODUODENOSCOPY (EGD)  WITH PROPOFOL N/A 12/14/2018   Procedure: ESOPHAGOGASTRODUODENOSCOPY (EGD) WITH BIOPSY;  Surgeon: Lucilla Lame, MD;  Location: [facility];  Service: Endoscopy;  Laterality: N/A;  . FOOT SURGERY     right foot  . POLYPECTOMY N/A 12/14/2018   Procedure: POLYPECTOMY;  Surgeon: Lucilla Lame, MD;  Location: [facility];  Service: Endoscopy;  Laterality: N/A;    Social History   Tobacco Use  . Smoking status: Current Every Day Smoker    Packs/day: 1.00    Years: 39.00    Pack years: 39.00    Types: Cigarettes  . Smokeless tobacco: Never Used  Substance Use Topics  . Alcohol use: Yes    Alcohol/week: 7.0 standard drinks    Types: 3 Cans of beer, 4 Shots of liquor per week    Comment: History of problems with alcohol. She drinks 2 (12 oz.) beer every night with 2 shots monthly.  . Drug use: Yes    Types: Marijuana    Comment: Smokes Marijuana 2-3 times a month.      Medication list has been reviewed and updated.  Current Meds  Medication Sig  . diphenhydrAMINE (BENADRYL) 25 MG tablet Take 25 mg by mouth every 6 (six) hours as needed.  Marland Kitchen ibuprofen (ADVIL,MOTRIN) 800 MG tablet Take 800 mg by mouth every 8 (eight) hours as needed.  . lamoTRIgine (LAMICTAL) 100 MG tablet Take 100 mg by mouth 2 (two) times daily.    PHQ 2/9 Scores 07/17/2019 03/25/2019  PHQ - 2 Score 2 1  PHQ- 9 Score 8 -    BP Readings from Last 3 Encounters:  07/17/19 (!) 154/98  04/12/19 120/82  04/01/19 125/81    Physical Exam Vitals and nursing note reviewed.  Constitutional:      General: She is not in acute distress.    Appearance: She is well-developed.  HENT:     Head: Normocephalic and atraumatic.     Right Ear: Tympanic membrane and ear canal normal.     Left Ear: Tympanic membrane and ear canal normal.     Nose:     Right Sinus: No maxillary sinus tenderness.     Left Sinus: No maxillary sinus tenderness.  Eyes:     General: No scleral icterus.       Right eye: No discharge.         Left eye: No discharge.     Extraocular Movements: Extraocular movements intact.     Conjunctiva/sclera: Conjunctivae normal.     Right eye: Chemosis present.     Left eye: Chemosis present.  Neck:     Thyroid: No thyromegaly.     Vascular: No carotid bruit.  Cardiovascular:     Rate and Rhythm: Normal rate and regular rhythm.     Pulses: Normal pulses.     Heart sounds: Normal heart sounds.  Pulmonary:     Effort: Pulmonary effort is normal. No respiratory distress.     Breath sounds: No wheezing.  Chest:     Breasts:        Right: No mass, nipple discharge, skin change or tenderness.        Left: No mass, nipple discharge, skin change or tenderness.  Abdominal:     General: Bowel sounds are normal.     Palpations: Abdomen is soft.     Tenderness: There is  no abdominal tenderness.  Genitourinary:    Labia:        Right: No tenderness, lesion or injury.        Left: No tenderness, lesion or injury.      Vagina: Erythema (mild atrophy but no bleeding) present.     Cervix: Normal.     Uterus: Normal.      Adnexa: Right adnexa normal and left adnexa normal.  Musculoskeletal:        General: Normal range of motion.     Cervical back: Normal range of motion. No erythema.     Right lower leg: No edema.     Left lower leg: No edema.  Lymphadenopathy:     Cervical: No cervical adenopathy.  Skin:    General: Skin is warm and dry.     Capillary Refill: Capillary refill takes less than 2 seconds.     Findings: No rash.  Neurological:     General: No focal deficit present.     Mental Status: She is alert and oriented to person, place, and time.     Cranial Nerves: No cranial nerve deficit.     Sensory: Sensation is intact. No sensory deficit.     Motor: Motor function is intact.     Deep Tendon Reflexes:     Reflex Scores:      Bicep reflexes are 1+ on the right side and 1+ on the left side.      Patellar reflexes are 2+ on the right side and 2+ on the left  side. Psychiatric:        Attention and Perception: Attention normal.        Mood and Affect: Mood normal.        Speech: Speech normal.        Behavior: Behavior normal.        Thought Content: Thought content normal.     Wt Readings from Last 3 Encounters:  07/17/19 164 lb (74.4 kg)  04/12/19 162 lb 3.2 oz (73.6 kg)  04/01/19 156 lb (70.8 kg)    BP (!) 154/98   Pulse 87   Temp 98.3 F (36.8 C) (Oral)   Ht 5\' 5"  (1.651 m)   Wt 164 lb (74.4 kg)   SpO2 97%   BMI 27.29 kg/m   Assessment and Plan: 1. Annual physical exam Normal exam Pt still smoking with no interest in quitting at this time - Lipid panel - POCT urinalysis dipstick  2. Encounter for screening mammogram for breast cancer To schedule at [facility]; Future  3. Encounter for screening for cervical cancer  completed - Cytology - PAP  4. Essential hypertension New onset - will begin treatment DASH diet given Follow up in 6 weeks - CBC with Differential/Platelet - Comprehensive metabolic panel - TSH - losartan (COZAAR) 50 MG tablet; Take 1 tablet (50 mg total) by mouth daily.  Dispense: 90 tablet; Refill: 0  5. Need for vaccination for pneumococcus - Pneumococcal polysaccharide vaccine 23-valent greater than or equal to 2yo subcutaneous/IM  6. Conjunctivitis of both eyes, unspecified conjunctivitis type - neomycin-polymyxin b-dexamethasone (MAXITROL) 3.5-10000-0.1 SUSP; Place 2 drops into both eyes every 6 (six) hours for 10 days.  Dispense: 5 mL; Refill: 0   Partially dictated using Editor, commissioning. Any errors are unintentional.  Halina Maidens, MD [facility]  07/17/2019

## 2019-07-18 LAB — LIPID PANEL
Chol/HDL Ratio: 1.7 ratio (ref 0.0–4.4)
Cholesterol, Total: 272 mg/dL — ABNORMAL HIGH (ref 100–199)
HDL: 163 mg/dL (ref >39)
LDL Chol Calc (NIH): 93 mg/dL (ref 0–99)
Triglycerides: 101 mg/dL (ref 0–149)
VLDL Cholesterol Cal: 16 mg/dL (ref 5–40)

## 2019-07-18 LAB — CBC WITH DIFFERENTIAL/PLATELET
Basophils Absolute: 0 10*3/uL (ref 0.0–0.2)
Basos: 1 %
EOS (ABSOLUTE): 0 10*3/uL (ref 0.0–0.4)
Eos: 1 %
Hematocrit: 37.7 % (ref 34.0–46.6)
Hemoglobin: 13 g/dL (ref 11.1–15.9)
Immature Grans (Abs): 0 10*3/uL (ref 0.0–0.1)
Immature Granulocytes: 0 %
Lymphocytes Absolute: 1.3 10*3/uL (ref 0.7–3.1)
Lymphs: 26 %
MCH: 34.7 pg — ABNORMAL HIGH (ref 26.6–33.0)
MCHC: 34.5 g/dL (ref 31.5–35.7)
MCV: 101 fL — ABNORMAL HIGH (ref 79–97)
Monocytes Absolute: 0.5 10*3/uL (ref 0.1–0.9)
Monocytes: 9 %
Neutrophils Absolute: 3.2 10*3/uL (ref 1.4–7.0)
Neutrophils: 63 %
Platelets: 131 10*3/uL — ABNORMAL LOW (ref 150–450)
RBC: 3.75 x10E6/uL — ABNORMAL LOW (ref 3.77–5.28)
RDW: 13 % (ref 11.7–15.4)
WBC: 5.1 10*3/uL (ref 3.4–10.8)

## 2019-07-18 LAB — COMPREHENSIVE METABOLIC PANEL
ALT: 53 IU/L — ABNORMAL HIGH (ref 0–32)
AST: 164 IU/L — ABNORMAL HIGH (ref 0–40)
Albumin/Globulin Ratio: 1.7 (ref 1.2–2.2)
Albumin: 4.6 g/dL (ref 3.8–4.9)
Alkaline Phosphatase: 132 IU/L — ABNORMAL HIGH (ref 39–117)
BUN/Creatinine Ratio: 19 (ref 9–23)
BUN: 11 mg/dL (ref 6–24)
Bilirubin Total: 1 mg/dL (ref 0.0–1.2)
CO2: 22 mmol/L (ref 20–29)
Calcium: 9.2 mg/dL (ref 8.7–10.2)
Chloride: 100 mmol/L (ref 96–106)
Creatinine, Ser: 0.57 mg/dL (ref 0.57–1.00)
GFR calc Af Amer: 120 mL/min/{1.73_m2} (ref >59)
GFR calc non Af Amer: 104 mL/min/{1.73_m2} (ref >59)
Globulin, Total: 2.7 g/dL (ref 1.5–4.5)
Glucose: 101 mg/dL — ABNORMAL HIGH (ref 65–99)
Potassium: 3.7 mmol/L (ref 3.5–5.2)
Sodium: 140 mmol/L (ref 134–144)
Total Protein: 7.3 g/dL (ref 6.0–8.5)

## 2019-07-18 LAB — CYTOLOGY - PAP
Comment: NEGATIVE
Diagnosis: NEGATIVE
High risk HPV: NEGATIVE

## 2019-07-18 LAB — TSH: TSH: 2.02 u[IU]/mL (ref 0.450–4.500)

## 2019-07-19 ENCOUNTER — Other Ambulatory Visit: Payer: Self-pay | Admitting: Internal Medicine

## 2019-07-19 DIAGNOSIS — R7989 Other specified abnormal findings of blood chemistry: Secondary | ICD-10-CM

## 2019-07-19 DIAGNOSIS — K760 Fatty (change of) liver, not elsewhere classified: Secondary | ICD-10-CM

## 2019-07-19 NOTE — Progress Notes (Signed)
Patient informed and agrees to Hepatic Elastography.

## 2019-07-24 ENCOUNTER — Other Ambulatory Visit: Payer: Self-pay

## 2019-07-24 ENCOUNTER — Ambulatory Visit
Admission: RE | Admit: 2019-07-24 | Discharge: 2019-07-24 | Disposition: A | Discharge status: Home / Self Care | Payer: Medicaid Other | Source: Ambulatory Visit | Attending: Internal Medicine | Admitting: Internal Medicine

## 2019-07-24 DIAGNOSIS — R7989 Other specified abnormal findings of blood chemistry: Secondary | ICD-10-CM | POA: Diagnosis not present

## 2019-07-24 DIAGNOSIS — K760 Fatty (change of) liver, not elsewhere classified: Secondary | ICD-10-CM | POA: Diagnosis present

## 2019-07-25 ENCOUNTER — Other Ambulatory Visit: Payer: Self-pay

## 2019-07-25 DIAGNOSIS — R7989 Other specified abnormal findings of blood chemistry: Secondary | ICD-10-CM

## 2019-07-30 LAB — HEPATITIS PANEL, ACUTE
Hep A IgM: NEGATIVE
Hep B C IgM: NEGATIVE
Hep C Virus Ab: <0.1 s/co ratio (ref 0.0–0.9)
Hepatitis B Surface Ag: NEGATIVE

## 2019-07-31 ENCOUNTER — Encounter: Payer: Self-pay | Admitting: Internal Medicine

## 2019-08-28 ENCOUNTER — Ambulatory Visit: Payer: Medicaid Other | Admitting: Internal Medicine

## 2019-09-17 ENCOUNTER — Telehealth: Payer: Self-pay

## 2019-09-17 NOTE — Telephone Encounter (Signed)
Patient called and left a VM on the nurse line saying she needs a inhaler of albuterol sent to [facility] on [address] in [place].   Reviewed pts chart and I only see one in haler from 2016 that Dr. Army Melia prescribed, and now problem shows up on her problem list for asthma.   Called pt to ask what diagnosis she uses this inhaler for- mailbox was full so could not leave msg.  CM

## 2019-09-18 ENCOUNTER — Other Ambulatory Visit: Payer: Self-pay

## 2019-09-18 DIAGNOSIS — J45909 Unspecified asthma, uncomplicated: Secondary | ICD-10-CM | POA: Insufficient documentation

## 2019-09-18 MED ORDER — ALBUTEROL SULFATE HFA 108 (90 BASE) MCG/ACT IN AERS: 2.0000 | INHALATION_SPRAY | Freq: Four times a day (QID) | RESPIRATORY_TRACT | 1 refills | Status: DC | PRN

## 2019-09-26 ENCOUNTER — Telehealth: Payer: Self-pay

## 2019-09-26 NOTE — Telephone Encounter (Signed)
Called meredith back and let her know that Dr. Army Melia is not going to order the pt a blood pressure cuff until she comes in for a bp follow up. She NO Showed at her last appt.  Told her to call back with any further questions.  CM

## 2019-09-26 NOTE — Telephone Encounter (Signed)
-----   Message from Fredderick Severance sent at 09/26/2019  7:20 AM EDT ----- [facility] called asking about a blood pressure cuff order they've sent over "several times." Dr B said she wasn't seen for the last 2 appts she had scheduled with y'all. The number to call Ailene Ravel back is- [phone]

## 2019-12-02 ENCOUNTER — Ambulatory Visit: Payer: Self-pay

## 2019-12-02 NOTE — Telephone Encounter (Signed)
Noted.   CM

## 2019-12-02 NOTE — Telephone Encounter (Signed)
Patient called and says she's been having SOB x 1 week and when she's up moving around in the house from room to room, the SOB is worse, she's dizzy and having aches in her legs. She says she's had some chills, but no fever. She says she's had a headache to the back of her eyes, but not constantly. She denies CP, no cough, runny nose due to smoking. Due to the SOB, the visit would have to be a virtual, patient doesn't have MyChart. I called the office and spoke to [place], [facility] who placed me on hold to speak to the nurse of Dr. Army Melia. Charleston Ropes says to let the patient know to go to the ED. I advised the patient and she says her husband is not home and she doesn't drive, so she will stay in the bed and have someone take her to the ED in the morning. I advised to call 911 if she gets worse, she verbalized understanding.  Reason for Disposition . [1] MODERATE longstanding difficulty breathing (e.g., speaks in phrases, SOB even at rest, pulse 100-120) AND [2] SAME as normal  Answer Assessment - Initial Assessment Questions 1. RESPIRATORY STATUS: "Describe your breathing?" (e.g., wheezing, shortness of breath, unable to speak, severe coughing)      Shortness of breath 2. ONSET: "When did this breathing problem begin?"       Last week 3. PATTERN "Does the difficult breathing come and go, or has it been constant since it started?"      When I move around it's the worse 4. SEVERITY: "How bad is your breathing?" (e.g., mild, moderate, severe)    - MILD: No SOB at rest, mild SOB with walking, speaks normally in sentences, can lay down, no retractions, pulse < 100.    - MODERATE: SOB at rest, SOB with minimal exertion and prefers to sit, cannot lie down flat, speaks in phrases, mild retractions, audible wheezing, pulse 100-120.    - SEVERE: Very SOB at rest, speaks in single words, struggling to breathe, sitting hunched forward, retractions, pulse > 120      Mild-moderate 5. RECURRENT SYMPTOM: "Have you had  difficulty breathing before?" If so, ask: "When was the last time?" and "What happened that time?"      Yes. Was sent to a specialist 6. CARDIAC HISTORY: "Do you have any history of heart disease?" (e.g., heart attack, angina, bypass surgery, angioplasty)      No 7. LUNG HISTORY: "Do you have any history of lung disease?"  (e.g., pulmonary embolus, asthma, emphysema)     Asthma, COPD 8. CAUSE: "What do you think is causing the breathing problem?"      No 9. OTHER SYMPTOMS: "Do you have any other symptoms? (e.g., dizziness, runny nose, cough, chest pain, fever)     Dizziness, body aches, weakness needing assistance to walk 10. PREGNANCY: "Is there any chance you are pregnant?" "When was your last menstrual period?"       No 11. TRAVEL: "Have you traveled out of the country in the last month?" (e.g., travel history, exposures)       No  Protocols used: BREATHING DIFFICULTY-A-AH

## 2019-12-12 ENCOUNTER — Inpatient Hospital Stay
Admission: EM | Admit: 2019-12-12 | Discharge: 2019-12-15 | DRG: 432 | Disposition: A | Discharge status: Home / Self Care | Payer: Medicaid Other | Attending: Internal Medicine | Admitting: Internal Medicine

## 2019-12-12 ENCOUNTER — Emergency Department: Payer: Medicaid Other

## 2019-12-12 ENCOUNTER — Other Ambulatory Visit: Payer: Self-pay

## 2019-12-12 ENCOUNTER — Encounter: Payer: Self-pay | Admitting: Emergency Medicine

## 2019-12-12 DIAGNOSIS — F319 Bipolar disorder, unspecified: Secondary | ICD-10-CM | POA: Diagnosis present

## 2019-12-12 DIAGNOSIS — Z808 Family history of malignant neoplasm of other organs or systems: Secondary | ICD-10-CM

## 2019-12-12 DIAGNOSIS — Z791 Long term (current) use of non-steroidal anti-inflammatories (NSAID): Secondary | ICD-10-CM

## 2019-12-12 DIAGNOSIS — K7200 Acute and subacute hepatic failure without coma: Secondary | ICD-10-CM | POA: Diagnosis present

## 2019-12-12 DIAGNOSIS — E8729 Other acidosis: Secondary | ICD-10-CM

## 2019-12-12 DIAGNOSIS — G9341 Metabolic encephalopathy: Secondary | ICD-10-CM | POA: Diagnosis present

## 2019-12-12 DIAGNOSIS — Z8249 Family history of ischemic heart disease and other diseases of the circulatory system: Secondary | ICD-10-CM

## 2019-12-12 DIAGNOSIS — Z20822 Contact with and (suspected) exposure to covid-19: Secondary | ICD-10-CM | POA: Diagnosis present

## 2019-12-12 DIAGNOSIS — K7010 Alcoholic hepatitis without ascites: Secondary | ICD-10-CM

## 2019-12-12 DIAGNOSIS — K7031 Alcoholic cirrhosis of liver with ascites: Secondary | ICD-10-CM | POA: Diagnosis present

## 2019-12-12 DIAGNOSIS — I10 Essential (primary) hypertension: Secondary | ICD-10-CM | POA: Diagnosis present

## 2019-12-12 DIAGNOSIS — E872 Acidosis: Secondary | ICD-10-CM | POA: Diagnosis present

## 2019-12-12 DIAGNOSIS — F17210 Nicotine dependence, cigarettes, uncomplicated: Secondary | ICD-10-CM | POA: Diagnosis present

## 2019-12-12 DIAGNOSIS — D539 Nutritional anemia, unspecified: Secondary | ICD-10-CM | POA: Diagnosis present

## 2019-12-12 DIAGNOSIS — K2950 Unspecified chronic gastritis without bleeding: Secondary | ICD-10-CM | POA: Diagnosis present

## 2019-12-12 DIAGNOSIS — K7040 Alcoholic hepatic failure without coma: Principal | ICD-10-CM | POA: Diagnosis present

## 2019-12-12 DIAGNOSIS — E876 Hypokalemia: Secondary | ICD-10-CM | POA: Diagnosis present

## 2019-12-12 DIAGNOSIS — K529 Noninfective gastroenteritis and colitis, unspecified: Secondary | ICD-10-CM | POA: Diagnosis present

## 2019-12-12 DIAGNOSIS — K7682 Hepatic encephalopathy: Secondary | ICD-10-CM

## 2019-12-12 DIAGNOSIS — M1710 Unilateral primary osteoarthritis, unspecified knee: Secondary | ICD-10-CM | POA: Diagnosis present

## 2019-12-12 DIAGNOSIS — D649 Anemia, unspecified: Secondary | ICD-10-CM

## 2019-12-12 DIAGNOSIS — J45909 Unspecified asthma, uncomplicated: Secondary | ICD-10-CM | POA: Diagnosis present

## 2019-12-12 DIAGNOSIS — E8770 Fluid overload, unspecified: Secondary | ICD-10-CM | POA: Diagnosis present

## 2019-12-12 DIAGNOSIS — F1020 Alcohol dependence, uncomplicated: Secondary | ICD-10-CM | POA: Diagnosis present

## 2019-12-12 DIAGNOSIS — Z79899 Other long term (current) drug therapy: Secondary | ICD-10-CM

## 2019-12-12 HISTORY — DX: Depression, unspecified: F32.A

## 2019-12-12 HISTORY — DX: Essential (primary) hypertension: I10

## 2019-12-12 LAB — MAGNESIUM: Magnesium: 1.6 mg/dL — ABNORMAL LOW (ref 1.7–2.4)

## 2019-12-12 LAB — CBC
HCT: 33 % — ABNORMAL LOW (ref 36.0–46.0)
Hemoglobin: 10.8 g/dL — ABNORMAL LOW (ref 12.0–15.0)
MCH: 38.7 pg — ABNORMAL HIGH (ref 26.0–34.0)
MCHC: 32.7 g/dL (ref 30.0–36.0)
MCV: 118.3 fL — ABNORMAL HIGH (ref 80.0–100.0)
Platelets: 181 10*3/uL (ref 150–400)
RBC: 2.79 MIL/uL — ABNORMAL LOW (ref 3.87–5.11)
RDW: 17 % — ABNORMAL HIGH (ref 11.5–15.5)
WBC: 14.8 10*3/uL — ABNORMAL HIGH (ref 4.0–10.5)
nRBC: 0.3 % — ABNORMAL HIGH (ref 0.0–0.2)

## 2019-12-12 LAB — COMPREHENSIVE METABOLIC PANEL
ALT: 75 U/L — ABNORMAL HIGH (ref 0–44)
AST: 248 U/L — ABNORMAL HIGH (ref 15–41)
Albumin: 2.5 g/dL — ABNORMAL LOW (ref 3.5–5.0)
Alkaline Phosphatase: 352 U/L — ABNORMAL HIGH (ref 38–126)
Anion gap: 27 — ABNORMAL HIGH (ref 5–15)
BUN: 15 mg/dL (ref 6–20)
CO2: 16 mmol/L — ABNORMAL LOW (ref 22–32)
Calcium: 8.1 mg/dL — ABNORMAL LOW (ref 8.9–10.3)
Chloride: 88 mmol/L — ABNORMAL LOW (ref 98–111)
Creatinine, Ser: 0.92 mg/dL (ref 0.44–1.00)
GFR calc Af Amer: >60 mL/min (ref >60)
GFR calc non Af Amer: >60 mL/min (ref >60)
Glucose, Bld: 93 mg/dL (ref 70–99)
Potassium: 2.4 mmol/L — CL (ref 3.5–5.1)
Sodium: 131 mmol/L — ABNORMAL LOW (ref 135–145)
Total Bilirubin: 16.7 mg/dL — ABNORMAL HIGH (ref 0.3–1.2)
Total Protein: 6.4 g/dL — ABNORMAL LOW (ref 6.5–8.1)

## 2019-12-12 LAB — AMMONIA: Ammonia: 91 umol/L — ABNORMAL HIGH (ref 9–35)

## 2019-12-12 LAB — PROTIME-INR
INR: 1.1 (ref 0.8–1.2)
Prothrombin Time: 13.6 seconds (ref 11.4–15.2)

## 2019-12-12 LAB — APTT: aPTT: 33 seconds (ref 24–36)

## 2019-12-12 MED ORDER — POTASSIUM CHLORIDE CRYS ER 20 MEQ PO TBCR
40.0000 meq | EXTENDED_RELEASE_TABLET | Freq: Once | ORAL | Status: AC
  Administered 2019-12-12: 40 meq via ORAL
  Filled 2019-12-12: qty 2

## 2019-12-12 MED ORDER — POTASSIUM CHLORIDE 10 MEQ/100ML IV SOLN
10.0000 meq | INTRAVENOUS | Status: AC
  Administered 2019-12-12 – 2019-12-13 (×4): 10 meq via INTRAVENOUS
  Filled 2019-12-12 (×4): qty 100

## 2019-12-12 MED ORDER — IOHEXOL 300 MG/ML  SOLN
100.0000 mL | Freq: Once | INTRAMUSCULAR | Status: AC | PRN
  Administered 2019-12-12: 100 mL via INTRAVENOUS

## 2019-12-12 MED ORDER — LACTATED RINGERS IV BOLUS
1000.0000 mL | Freq: Once | INTRAVENOUS | Status: AC
  Administered 2019-12-12: 1000 mL via INTRAVENOUS

## 2019-12-12 NOTE — ED Notes (Signed)
Pt brought to the er by her family for jaundice. Pt states she doesn't have any medical problems other than HTN. Pt denies taking any medications but when told her potassium is very low, she stated that was normal. Speech is slow and slurred. Pt states had 3 shots of etoh today

## 2019-12-12 NOTE — ED Provider Notes (Signed)
[facility] Emergency Department Provider Note  ____________________________________________  Time seen: Approximately 11:19 PM  I have reviewed the triage vital signs and the nursing notes.   HISTORY  Chief Complaint Jaundice  Level 5 caveat:  Portions of the history and physical were unable to be obtained due to encephalopathy   HPI ELIN FENLEY is a 59 y.o. female with a history of hypertension, seizures, GERD, alcohol abuse  who was brought in by her family for jaundice.  According to the patient and her husband has noted that she has been jaundiced for a very long time.  Patient refused to come in to be evaluated.  Today her daughter brought her to be evaluated.  Patient reports drinking 3-4 shots of liquor a day for the last 2 years.  Patient is very confused and keeps changing her story.  She denies any Tylenol ingestion, history of hepatitis, history of IV drug use, unintentional weight loss, family history of pancreatic cancer, abdominal pain, nausea or vomiting.  Past Medical History:  Diagnosis Date  . Asthma   . Depression   . GERD (gastroesophageal reflux disease)   . Hypertension   . Kidney stones   . Osteoarthrosis, unspecified whether generalized or localized, involving lower leg    leg  . Other bipolar disorders   . Seizure ([place])    none since age 89  . Tobacco abuse   . Trigger finger   . Wears dentures    partial upper    Patient Active Problem List   Diagnosis Date Noted  . Mild asthma 09/18/2019  . Chronic venous insufficiency 04/04/2019  . Leg pain 04/04/2019  . Essential hypertension 04/04/2019  . PAD (peripheral artery disease) ([place]) 03/25/2019  . Localized edema 03/25/2019  . Tubular adenoma of colon 12/18/2018  . Family history of colonic polyps   . Duodenitis   . Gastritis without bleeding   . Melena 12/03/2018  . Hepatic steatosis 12/03/2018  . Tobacco dependence 06/12/2015  . Elevated blood pressure  06/12/2015  . 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 Dizziness 10/17/2013  . Abnormal EKG 10/17/2013  . Back pain 10/17/2013  . Bipolar disorder ([place]) 10/17/2013  . Prolonged QT interval 10/17/2013  . Near syncope 10/17/2013    Past Surgical History:  Procedure Laterality Date  . COLONOSCOPY WITH PROPOFOL N/A 12/14/2018   Procedure: COLONOSCOPY WITH PROPOFOL;  Surgeon: Lucilla Lame, MD;  Location: [facility];  Service: Endoscopy;  Laterality: N/A;  . ESOPHAGOGASTRODUODENOSCOPY (EGD) WITH PROPOFOL N/A 12/14/2018   Procedure: ESOPHAGOGASTRODUODENOSCOPY (EGD) WITH BIOPSY;  Surgeon: Lucilla Lame, MD;  Location: [facility];  Service: Endoscopy;  Laterality: N/A;  . FOOT SURGERY     right foot  . POLYPECTOMY N/A 12/14/2018   Procedure: POLYPECTOMY;  Surgeon: Lucilla Lame, MD;  Location: [place];  Service: Endoscopy;  Laterality: N/A;    Prior to Admission medications   Medication Sig Start Date End Date Taking? Authorizing Provider  albuterol (VENTOLIN HFA) 108 (90 Base) MCG/ACT inhaler Inhale 2 puffs into the lungs every 6 (six) hours as needed for wheezing or shortness of breath. 09/18/19   Glean Hess, MD  diphenhydrAMINE (BENADRYL) 25 MG tablet Take 25 mg by mouth every 6 (six) hours as needed.    [provider]  hydrochlorothiazide (HYDRODIURIL) 25 MG tablet Take 1 tablet (25 mg total) by mouth daily. Patient not taking: Reported on 07/17/2019 03/25/19   Glean Hess, MD  ibuprofen (ADVIL,MOTRIN) 800 MG tablet Take 800 mg by mouth every 8 (eight) hours as needed.    [provider]  lamoTRIgine (LAMICTAL) 100 MG tablet Take 100 mg by mouth 2 (two) times daily.    [provider]  losartan (COZAAR) 50 MG tablet Take 1 tablet (50 mg total) by mouth daily. 07/17/19   Glean Hess, MD    Allergies Prozac [fluoxetine hcl], Seroquel [quetiapine fumarate], and Sulfa antibiotics  Family History    Problem Relation Age of Onset  . Heart disease Father 74       CABG x 3  . Heart attack Father 57       MI  . Hypertension Mother   . Throat cancer Brother     Social History Social History   Tobacco Use  . Smoking status: Current Every Day Smoker    Packs/day: 0.25    Years: 39.00    Pack years: 9.75    Types: Cigarettes  . Smokeless tobacco: Never Used  Vaping Use  . Vaping Use: Never used  Substance Use Topics  . Alcohol use: Yes    Alcohol/week: 7.0 standard drinks    Types: 3 Cans of beer, 4 Shots of liquor per week    Comment: Pt states 3-4 shots a day  . Drug use: Yes    Types: Marijuana    Comment: Smokes Marijuana 2-3 times a month.     Review of Systems  Constitutional: Negative for fever. + confusion Cardiovascular: Negative for chest pain. Respiratory: Negative for shortness of breath. Gastrointestinal: Negative for abdominal pain, vomiting or diarrhea. Genitourinary: Negative for dysuria. Musculoskeletal: Negative for back pain. Skin: Negative for rash. + jaundice Neurological: Negative for headaches, weakness or numbness. Psych: No SI or HI  ____________________________________________   PHYSICAL EXAM:  VITAL SIGNS: ED Triage Vitals [12/12/19 2109]  Enc Vitals Group     BP 120/87     Pulse Rate 94     Resp 16     Temp 97.7 F (36.5 C)     Temp Source Oral     SpO2 100 %     Weight 150 lb (68 kg)     Height 5\' 5"  (1.651 m)     Head Circumference      Peak Flow      Pain Score 0     Pain Loc      Pain Edu?      Excl. in [place]?     Constitutional: Alert, confused, jaundiced.  HEENT:      Head: Normocephalic and atraumatic.         Eyes: Conjunctivae are normal. Sclera is icteric.       Mouth/Throat: Mucous membranes are moist.       Neck: Supple with no signs of meningismus. Cardiovascular: Regular rate and rhythm. No murmurs, gallops, or rubs.  Respiratory: Normal respiratory effort. Lungs are clear to auscultation bilaterally.   Gastrointestinal: Soft, non tender, and non distended with positive bowel sounds. No rebound or guarding. Musculoskeletal: 2+ pitting edema bilaterally Neurologic: confused. Face is symmetric. Moving all extremities. No gross focal neurologic deficits are appreciated. Skin: Skin is warm, dry and intact. No rash noted. Psychiatric: Mood and affect are normal. Speech and behavior are normal.  ____________________________________________   LABS (all labs ordered are listed, but only abnormal results are displayed)  Labs Reviewed  CBC - Abnormal; Notable for the following components:      Result Value   WBC 14.8 (*)  RBC 2.79 (*)    Hemoglobin 10.8 (*)    HCT 33.0 (*)    MCV 118.3 (*)    MCH 38.7 (*)    RDW 17.0 (*)    nRBC 0.3 (*)    All other components within normal limits  COMPREHENSIVE METABOLIC PANEL - Abnormal; Notable for the following components:   Sodium 131 (*)    Potassium 2.4 (*)    Chloride 88 (*)    CO2 16 (*)    Calcium 8.1 (*)    Total Protein 6.4 (*)    Albumin 2.5 (*)    AST 248 (*)    ALT 75 (*)    Alkaline Phosphatase 352 (*)    Total Bilirubin 16.7 (*)    Anion gap 27 (*)    All other components within normal limits  AMMONIA - Abnormal; Notable for the following components:   Ammonia 91 (*)    All other components within normal limits  MAGNESIUM - Abnormal; Notable for the following components:   Magnesium 1.6 (*)    All other components within normal limits  SARS CORONAVIRUS 2 BY RT PCR (HOSPITAL ORDER, [facility])  PROTIME-INR  APTT   ____________________________________________  EKG  ED ECG REPORT I, Rudene Re, the attending physician, personally viewed and interpreted this ECG.  Normal sinus rhythm, rate of 96, prolonged QTC, normal axis, diffuse mild ST depressions with no ST elevations.  Prolonged QTC is unchanged when compared to prior but ST depressions are  new. ____________________________________________  RADIOLOGY  I have personally reviewed the images performed during this visit and I agree with the Radiologist's read.   Interpretation by Radiologist:  CT ABDOMEN PELVIS W CONTRAST  Result Date: 12/12/2019 CLINICAL DATA:  Elevated LFTs and bilirubin. Concern for new liver failure. EXAM: CT ABDOMEN AND PELVIS WITH CONTRAST TECHNIQUE: Multidetector CT imaging of the abdomen and pelvis was performed using the standard protocol following bolus administration of intravenous contrast. CONTRAST:  175mL OMNIPAQUE IOHEXOL 300 MG/ML  SOLN COMPARISON:  Abdominal ultrasound 07/22/2019.  CT 11/26/2018 FINDINGS: Lower chest: No focal airspace disease or pleural effusion. Heart is normal in size. There are coronary artery calcifications. Hepatobiliary: Enlarged liver spanning 24 cm cranial caudal. Progressive hepatomegaly from prior exam. Severely decreased hepatic parenchyma consistent with advanced steatosis. Minimal focal fatty sparing adjacent to the gallbladder fossa. No evidence of discrete focal lesion. No definite capsular nodularity. Minimal high-density gallbladder contents may represent sludge or small stones. There is no pericholecystic inflammation. No biliary dilatation. Recannulized umbilical vein. Pancreas: No ductal dilatation or inflammation. There is no evidence of pancreatic mass. Spleen: Normal in size without focal abnormality. Adrenals/Urinary Tract: Normal adrenal glands. Nonobstructing stones in the left kidney. No hydronephrosis. Mild cortical scarring in the lower right kidney. Urinary bladder is partially distended. No bladder wall thickening. Stomach/Bowel: Paraesophageal and proximal perigastric varices. Stomach is decompressed. 6 mm intramural lipoma in the third portion of the duodenum, nonobstructive in incidental. Moderate length segment of small bowel wall thickening and edema in the lower abdomen. No obstruction. Appendix not  confidently visualized. Submucosal fatty infiltration involving the cecum and ascending colon. Small volume of colonic stool. Diverticulosis in the descending and sigmoid colon without diverticulitis. Vascular/Lymphatic: Age advanced aortic atherosclerosis. No aortic aneurysm. Patent portal and splenic vein. Recannulated umbilical vein with collaterals coursing into the inferior epigastric cyst in the right lower abdomen. Few additional portosystemic collaterals in the abdomen. Enlarged periportal node measuring 11 mm, similar to prior and  likely reactive. Reproductive: Uterus and bilateral adnexa are unremarkable. Other: Small volume abdominopelvic ascites.  No free air. Musculoskeletal: Degenerative disc disease at L5-S1. Suggestion of early avascular necrosis of the left femoral head without subchondral collapse. There are no acute or suspicious osseous abnormalities. IMPRESSION: 1. Hepatomegaly and severe hepatic steatosis. Progressive hepatomegaly from prior exam. No discrete focal lesion. 2. Portal hypertension with recanalized umbilical vein, paraesophageal and proximal perigastric varices, and small volume abdominopelvic ascites. 3. No biliary dilatation.  No pancreatic mass. 4. Moderate length segment of small bowel wall thickening and edema in the lower abdomen, consistent with enteritis, likely reactive in the setting with ascites. 5. Nonobstructing left renal stones. 6. Colonic diverticulosis without diverticulitis. 7. Aortic atherosclerosis is age advanced. Aortic Atherosclerosis (ICD10-I70.0). Electronically Signed   By: Keith Rake M.D.   On: 12/12/2019 23:56      ____________________________________________   PROCEDURES  Procedure(s) performed:yes .1-3 Lead EKG Interpretation Performed by: Rudene Re, MD Authorized by: Rudene Re, MD     Interpretation: non-specific     ECG rate assessment: normal     Rhythm: sinus rhythm     Ectopy: none     Critical Care  performed: yes  CRITICAL CARE Performed by: Rudene Re  ?  Total critical care time: 40 min  Critical care time was exclusive of separately billable procedures and treating other patients.  Critical care was necessary to treat or prevent imminent or life-threatening deterioration.  Critical care was time spent personally by me on the following activities: development of treatment plan with patient and/or surrogate as well as nursing, discussions with consultants, evaluation of patient's response to treatment, examination of patient, obtaining history from patient or surrogate, ordering and performing treatments and interventions, ordering and review of laboratory studies, ordering and review of radiographic studies, pulse oximetry and re-evaluation of patient's condition.  ____________________________________________   INITIAL IMPRESSION / ASSESSMENT AND PLAN / ED COURSE  59 y.o. female with a history of hypertension, seizures, GERD, alcohol abuse  who was brought in by her family for jaundice.  Patient has scleral icterus and jaundice, confused concerning for liver failure/cirrhosis.  No localized neurological deficits.  Labs showing new anemia, leukocytosis, hypokalemia, transaminitis, and a T bili of 16.7.  Patient also has an anion gap of 27.  Normal INR and platelets.  EKG showing prolonged QTC however that seems to be chronic.  Will supplement potassium p.o. and IV.  We will check her magnesium level.  Will check ammonia levels.  We will get a CT to rule out any evidence of cancer.  Most likely alcohol induced cirrhosis.  Will start patient on IV fluids and lactulose.  Anticipate admission.  Old medical records reviewed.  Patient placed on telemetry for close monitoring.  _________________________ 12:24 AM on 12/13/2019 -----------------------------------------  CT with no evidence of common bile duct obstruction or mass.  It does show signs of cirrhosis of the liver,  confirmed by radiology.  Patient also with hypomagnesemia which was supplemented p.o.  Ammonia is elevated 91 concerning for hepatic encephalopathy.  Will start patient on lactulose.  Patient also has new anemia with elevated MCV possibly due to B12 or folate deficiency.  Will discuss with the hospitalist for admission.    _____________________________________________ Please note:  Patient was evaluated in Emergency Department today for the symptoms described in the history of present illness. Patient was evaluated in the context of the global COVID-19 pandemic, which necessitated consideration that the patient might be at risk for infection  with the SARS-CoV-2 virus that causes COVID-19. Institutional protocols and algorithms that pertain to the evaluation of patients at risk for COVID-19 are in a state of rapid change based on information released by regulatory bodies including the CDC and federal and state organizations. These policies and algorithms were followed during the patient's care in the ED.  Some ED evaluations and interventions may be delayed as a result of limited staffing during the pandemic.   [place] Controlled Substance Database was reviewed by me. ____________________________________________   FINAL CLINICAL IMPRESSION(S) / ED DIAGNOSES   Final diagnoses:  Alcoholic cirrhosis of liver with ascites (HCC)  Hepatic encephalopathy (HCC)  Hypokalemia  Anemia, unspecified type  Hypomagnesemia      NEW MEDICATIONS STARTED DURING THIS VISIT:  ED Discharge Orders    None       Note:  This document was prepared using Dragon voice recognition software and may include unintentional dictation errors.    Alfred Levins, [place], MD 12/13/19 Laureen Abrahams

## 2019-12-12 NOTE — ED Triage Notes (Signed)
Pt to ED from home with family c/o jaundice.  Pt states she drinks 3-4 shots every day, denies drug use, denies hx of jaundice.  Pt skin and sclera are yellow.  Denies pain or other symptoms.  Pt A&Ox4, chest rise even and unlabored, in NAD at this time.

## 2019-12-13 ENCOUNTER — Observation Stay: Payer: Medicaid Other

## 2019-12-13 DIAGNOSIS — K7031 Alcoholic cirrhosis of liver with ascites: Secondary | ICD-10-CM | POA: Diagnosis present

## 2019-12-13 DIAGNOSIS — K7682 Hepatic encephalopathy: Secondary | ICD-10-CM

## 2019-12-13 DIAGNOSIS — K7200 Acute and subacute hepatic failure without coma: Secondary | ICD-10-CM | POA: Diagnosis not present

## 2019-12-13 DIAGNOSIS — E8729 Other acidosis: Secondary | ICD-10-CM

## 2019-12-13 DIAGNOSIS — D539 Nutritional anemia, unspecified: Secondary | ICD-10-CM

## 2019-12-13 DIAGNOSIS — Z20822 Contact with and (suspected) exposure to covid-19: Secondary | ICD-10-CM | POA: Diagnosis present

## 2019-12-13 DIAGNOSIS — Z791 Long term (current) use of non-steroidal anti-inflammatories (NSAID): Secondary | ICD-10-CM | POA: Diagnosis not present

## 2019-12-13 DIAGNOSIS — K2950 Unspecified chronic gastritis without bleeding: Secondary | ICD-10-CM | POA: Diagnosis present

## 2019-12-13 DIAGNOSIS — J45909 Unspecified asthma, uncomplicated: Secondary | ICD-10-CM | POA: Diagnosis present

## 2019-12-13 DIAGNOSIS — E872 Acidosis: Secondary | ICD-10-CM | POA: Diagnosis present

## 2019-12-13 DIAGNOSIS — M1710 Unilateral primary osteoarthritis, unspecified knee: Secondary | ICD-10-CM | POA: Diagnosis present

## 2019-12-13 DIAGNOSIS — F17210 Nicotine dependence, cigarettes, uncomplicated: Secondary | ICD-10-CM | POA: Diagnosis present

## 2019-12-13 DIAGNOSIS — Z79899 Other long term (current) drug therapy: Secondary | ICD-10-CM | POA: Diagnosis not present

## 2019-12-13 DIAGNOSIS — E8770 Fluid overload, unspecified: Secondary | ICD-10-CM | POA: Diagnosis present

## 2019-12-13 DIAGNOSIS — E876 Hypokalemia: Secondary | ICD-10-CM | POA: Diagnosis present

## 2019-12-13 DIAGNOSIS — F1020 Alcohol dependence, uncomplicated: Secondary | ICD-10-CM

## 2019-12-13 DIAGNOSIS — K7040 Alcoholic hepatic failure without coma: Secondary | ICD-10-CM | POA: Diagnosis not present

## 2019-12-13 DIAGNOSIS — I10 Essential (primary) hypertension: Secondary | ICD-10-CM | POA: Diagnosis present

## 2019-12-13 DIAGNOSIS — K7010 Alcoholic hepatitis without ascites: Secondary | ICD-10-CM

## 2019-12-13 DIAGNOSIS — Z808 Family history of malignant neoplasm of other organs or systems: Secondary | ICD-10-CM | POA: Diagnosis not present

## 2019-12-13 DIAGNOSIS — K7290 Hepatic failure, unspecified without coma: Secondary | ICD-10-CM

## 2019-12-13 DIAGNOSIS — Z8249 Family history of ischemic heart disease and other diseases of the circulatory system: Secondary | ICD-10-CM | POA: Diagnosis not present

## 2019-12-13 DIAGNOSIS — F319 Bipolar disorder, unspecified: Secondary | ICD-10-CM | POA: Diagnosis present

## 2019-12-13 DIAGNOSIS — K529 Noninfective gastroenteritis and colitis, unspecified: Secondary | ICD-10-CM | POA: Diagnosis present

## 2019-12-13 DIAGNOSIS — G9341 Metabolic encephalopathy: Secondary | ICD-10-CM | POA: Diagnosis present

## 2019-12-13 LAB — CBC
HCT: 26.4 % — ABNORMAL LOW (ref 36.0–46.0)
Hemoglobin: 9.1 g/dL — ABNORMAL LOW (ref 12.0–15.0)
MCH: 38.2 pg — ABNORMAL HIGH (ref 26.0–34.0)
MCHC: 34.5 g/dL (ref 30.0–36.0)
MCV: 110.9 fL — ABNORMAL HIGH (ref 80.0–100.0)
Platelets: 164 10*3/uL (ref 150–400)
RBC: 2.38 MIL/uL — ABNORMAL LOW (ref 3.87–5.11)
RDW: 16.6 % — ABNORMAL HIGH (ref 11.5–15.5)
WBC: 13.4 10*3/uL — ABNORMAL HIGH (ref 4.0–10.5)
nRBC: 0.2 % (ref 0.0–0.2)

## 2019-12-13 LAB — COMPREHENSIVE METABOLIC PANEL
ALT: 70 U/L — ABNORMAL HIGH (ref 0–44)
AST: 222 U/L — ABNORMAL HIGH (ref 15–41)
Albumin: 2.3 g/dL — ABNORMAL LOW (ref 3.5–5.0)
Alkaline Phosphatase: 333 U/L — ABNORMAL HIGH (ref 38–126)
Anion gap: 16 — ABNORMAL HIGH (ref 5–15)
BUN: 16 mg/dL (ref 6–20)
CO2: 24 mmol/L (ref 22–32)
Calcium: 7.8 mg/dL — ABNORMAL LOW (ref 8.9–10.3)
Chloride: 91 mmol/L — ABNORMAL LOW (ref 98–111)
Creatinine, Ser: 0.84 mg/dL (ref 0.44–1.00)
GFR calc Af Amer: >60 mL/min (ref >60)
GFR calc non Af Amer: >60 mL/min (ref >60)
Glucose, Bld: 117 mg/dL — ABNORMAL HIGH (ref 70–99)
Potassium: 2.9 mmol/L — ABNORMAL LOW (ref 3.5–5.1)
Sodium: 131 mmol/L — ABNORMAL LOW (ref 135–145)
Total Bilirubin: 15.2 mg/dL — ABNORMAL HIGH (ref 0.3–1.2)
Total Protein: 5.7 g/dL — ABNORMAL LOW (ref 6.5–8.1)

## 2019-12-13 LAB — IRON AND TIBC
Iron: 96 ug/dL (ref 28–170)
Saturation Ratios: 62 % — ABNORMAL HIGH (ref 10.4–31.8)
TIBC: 154 ug/dL — ABNORMAL LOW (ref 250–450)
UIBC: 58 ug/dL

## 2019-12-13 LAB — FOLATE: Folate: 5.6 ng/mL — ABNORMAL LOW (ref >5.9)

## 2019-12-13 LAB — SARS CORONAVIRUS 2 BY RT PCR (HOSPITAL ORDER, PERFORMED IN ~~LOC~~ HOSPITAL LAB): SARS Coronavirus 2: NEGATIVE

## 2019-12-13 LAB — VITAMIN B12: Vitamin B-12: 3149 pg/mL — ABNORMAL HIGH (ref 180–914)

## 2019-12-13 LAB — PROTIME-INR
INR: 1.2 (ref 0.8–1.2)
Prothrombin Time: 14.6 seconds (ref 11.4–15.2)

## 2019-12-13 LAB — FERRITIN: Ferritin: 388 ng/mL — ABNORMAL HIGH (ref 11–307)

## 2019-12-13 LAB — PHOSPHORUS: Phosphorus: UNDETERMINED mg/dL (ref 2.5–4.6)

## 2019-12-13 LAB — HEPATITIS B CORE ANTIBODY, IGM: Hep B C IgM: NONREACTIVE

## 2019-12-13 LAB — MAGNESIUM: Magnesium: 2.1 mg/dL (ref 1.7–2.4)

## 2019-12-13 LAB — HIV ANTIBODY (ROUTINE TESTING W REFLEX): HIV Screen 4th Generation wRfx: NONREACTIVE

## 2019-12-13 MED ORDER — FOLIC ACID 1 MG PO TABS
1.0000 mg | ORAL_TABLET | Freq: Every day | ORAL | Status: DC
  Administered 2019-12-13 – 2019-12-15 (×3): 1 mg via ORAL
  Filled 2019-12-13 (×3): qty 1

## 2019-12-13 MED ORDER — POTASSIUM CHLORIDE CRYS ER 20 MEQ PO TBCR
40.0000 meq | EXTENDED_RELEASE_TABLET | Freq: Two times a day (BID) | ORAL | Status: AC
  Administered 2019-12-13 – 2019-12-14 (×4): 40 meq via ORAL
  Filled 2019-12-13 (×4): qty 2

## 2019-12-13 MED ORDER — LORAZEPAM 2 MG/ML IJ SOLN: 1.0000 mg | INTRAMUSCULAR | Status: DC | PRN

## 2019-12-13 MED ORDER — MAGNESIUM SULFATE 2 GM/50ML IV SOLN
2.0000 g | Freq: Once | INTRAVENOUS | Status: AC
  Administered 2019-12-13: 2 g via INTRAVENOUS
  Filled 2019-12-13: qty 50

## 2019-12-13 MED ORDER — SODIUM CHLORIDE 0.9 % IV SOLN: INTRAVENOUS | Status: AC

## 2019-12-13 MED ORDER — LACTULOSE 10 GM/15ML PO SOLN
30.0000 g | Freq: Once | ORAL | Status: AC
  Administered 2019-12-13: 30 g via ORAL
  Filled 2019-12-13: qty 60

## 2019-12-13 MED ORDER — IBUPROFEN 400 MG PO TABS
400.0000 mg | ORAL_TABLET | Freq: Four times a day (QID) | ORAL | Status: DC | PRN
  Administered 2019-12-13 – 2019-12-14 (×2): 400 mg via ORAL
  Filled 2019-12-13 (×2): qty 1

## 2019-12-13 MED ORDER — THIAMINE HCL 100 MG/ML IJ SOLN
100.0000 mg | Freq: Every day | INTRAMUSCULAR | Status: DC
  Filled 2019-12-13 (×3): qty 2

## 2019-12-13 MED ORDER — LORAZEPAM 2 MG/ML IJ SOLN
0.0000 mg | Freq: Four times a day (QID) | INTRAMUSCULAR | Status: AC
  Filled 2019-12-13 (×2): qty 1

## 2019-12-13 MED ORDER — ADULT MULTIVITAMIN W/MINERALS CH
1.0000 | ORAL_TABLET | Freq: Every day | ORAL | Status: DC
  Administered 2019-12-13 – 2019-12-15 (×3): 1 via ORAL
  Filled 2019-12-13 (×3): qty 1

## 2019-12-13 MED ORDER — ENOXAPARIN SODIUM 40 MG/0.4ML ~~LOC~~ SOLN
40.0000 mg | SUBCUTANEOUS | Status: DC
  Administered 2019-12-13 – 2019-12-15 (×3): 40 mg via SUBCUTANEOUS
  Filled 2019-12-13 (×3): qty 0.4

## 2019-12-13 MED ORDER — LACTULOSE 10 GM/15ML PO SOLN
30.0000 g | Freq: Three times a day (TID) | ORAL | Status: DC
  Administered 2019-12-13 (×3): 30 g via ORAL
  Filled 2019-12-13 (×4): qty 60

## 2019-12-13 MED ORDER — LORAZEPAM 1 MG PO TABS: 1.0000 mg | ORAL_TABLET | ORAL | Status: DC | PRN

## 2019-12-13 MED ORDER — THIAMINE HCL 100 MG PO TABS
100.0000 mg | ORAL_TABLET | Freq: Every day | ORAL | Status: DC
  Administered 2019-12-13 – 2019-12-15 (×3): 100 mg via ORAL
  Filled 2019-12-13 (×3): qty 1

## 2019-12-13 MED ORDER — LORAZEPAM 2 MG/ML IJ SOLN: 0.0000 mg | Freq: Two times a day (BID) | INTRAMUSCULAR | Status: DC

## 2019-12-13 NOTE — H&P (Signed)
History and Physical    KAMPBELL HOLAWAY NMM:[id number] DOB: [birth year] DOA: 12/12/2019  PCP: Glean Hess, MD   Patient coming from: Home  I have personally briefly reviewed patient's old medical records in [place]  Chief Complaint: Confusion  HPI: Katherine Goodwin is a 59 y.o. female with medical history significant for hypertension, asthma, bipolar mood disorder, heavy alcohol use of 3-4 shots of liquor per day for the past couple years who was brought in by her daughter because of concerns for confusion.  According to the husband, patient had been jaundiced for a while.  In February 2021 she had an ultrasound that showed hepatic steatosis without focal lesion.  A week ago she called her PCP with a complaint of shortness of breath on moving from room to room in the house as well as dizziness and she was advised to go to the emergency room however she refused.  She has since become increasingly confused and her daughter insisted that she come into the ER to get checked out.  Most of the history is taken from ER records and family members as patient is unable to contribute due to confusion. ED Course: On arrival, patient confused.  Vital signs within normal limits.  Several abnormalities on blood work including elevated LFTs with bilirubin 16.7, ammonia 91.  Potassium 2.4, magnesium 1.6, anion gap 27.  WBC 14,800 with hemoglobin 10.8.  INR within normal limits.  CT abdomen and pelvis with contrast showing hepatomegaly with severe hepatic steatosis, portal hypertension with small volume abdominopelvic ascites.  Also showed moderate length small bowel wall thickening and edema consistent with enteritis possibly reactive to ascites.  Patient was given magnesium and potassium supplementation in the ER and given a dose of lactulose.  Hospitalist consulted for admission.  Review of Systems: As per HPI otherwise all other systems on review of systems negative.    Past Medical History:    Diagnosis Date  . Asthma   . Depression   . GERD (gastroesophageal reflux disease)   . Hypertension   . Kidney stones   . Osteoarthrosis, unspecified whether generalized or localized, involving lower leg    leg  . Other bipolar disorders   . Seizure ([place])    none since age 13  . Tobacco abuse   . Trigger finger   . Wears dentures    partial upper    Past Surgical History:  Procedure Laterality Date  . COLONOSCOPY WITH PROPOFOL N/A 12/14/2018   Procedure: COLONOSCOPY WITH PROPOFOL;  Surgeon: Lucilla Lame, MD;  Location: [facility];  Service: Endoscopy;  Laterality: N/A;  . ESOPHAGOGASTRODUODENOSCOPY (EGD) WITH PROPOFOL N/A 12/14/2018   Procedure: ESOPHAGOGASTRODUODENOSCOPY (EGD) WITH BIOPSY;  Surgeon: Lucilla Lame, MD;  Location: [facility];  Service: Endoscopy;  Laterality: N/A;  . FOOT SURGERY     right foot  . POLYPECTOMY N/A 12/14/2018   Procedure: POLYPECTOMY;  Surgeon: Lucilla Lame, MD;  Location: [facility];  Service: Endoscopy;  Laterality: N/A;     reports that she has been smoking cigarettes. She has a 9.75 pack-year smoking history. She has never used smokeless tobacco. She reports current alcohol use of about 7.0 standard drinks of alcohol per week. She reports current drug use. Drug: Marijuana.  Allergies  Allergen Reactions  . Prozac [Fluoxetine Hcl] Other (See Comments)    Homicidal and suicidal thoughts  . Seroquel [Quetiapine Fumarate] Other (See Comments)    Homicidal and suicidal thoughts  . Sulfa Antibiotics Nausea And  Vomiting    Family History  Problem Relation Age of Onset  . Heart disease Father 59       CABG x 3  . Heart attack Father 96       MI  . Hypertension Mother   . Throat cancer Brother       Prior to Admission medications   Medication Sig Start Date End Date Taking? Authorizing Provider  albuterol (VENTOLIN HFA) 108 (90 Base) MCG/ACT inhaler Inhale 2 puffs into the lungs every 6 (six) hours as needed for  wheezing or shortness of breath. 09/18/19   Glean Hess, MD  diphenhydrAMINE (BENADRYL) 25 MG tablet Take 25 mg by mouth every 6 (six) hours as needed.    [provider]  hydrochlorothiazide (HYDRODIURIL) 25 MG tablet Take 1 tablet (25 mg total) by mouth daily. Patient not taking: Reported on 07/17/2019 03/25/19   Glean Hess, MD  ibuprofen (ADVIL,MOTRIN) 800 MG tablet Take 800 mg by mouth every 8 (eight) hours as needed.    [provider]  lamoTRIgine (LAMICTAL) 100 MG tablet Take 100 mg by mouth 2 (two) times daily.    [provider]  losartan (COZAAR) 50 MG tablet Take 1 tablet (50 mg total) by mouth daily. 07/17/19   Glean Hess, MD    Physical Exam: Vitals:   12/12/19 2109 12/12/19 2300  BP: 120/87 126/74  Pulse: 94 98  Resp: 16   Temp: 97.7 F (36.5 C)   TempSrc: Oral   SpO2: 100% 99%  Weight: 68 kg   Height: _0  (1.651 m)      Vitals:   12/12/19 2109 12/12/19 2300  BP: 120/87 126/74  Pulse: 94 98  Resp: 16   Temp: 97.7 F (36.5 C)   TempSrc: Oral   SpO2: 100% 99%  Weight: 68 kg   Height: _1  (1.651 m)       Constitutional:  Somnolent but easily arousable and oriented x 3 . Not in any apparent distress HEENT:      Head: Normocephalic and atraumatic.         Eyes: PERLA, EOMI, Conjunctivae are normal. Sclera is icteric.       Mouth/Throat: Mucous membranes are moist.       Neck: Supple with no signs of meningismus. Cardiovascular: Regular rate and rhythm. No murmurs, gallops, or rubs. 2+ symmetrical distal pulses are present . No JVD. No LE edema Respiratory: Respiratory effort normal .Lungs sounds clear bilaterally. No wheezes, crackles, or rhonchi.  Gastrointestinal: Soft, non tender, distended, positive bowel sounds. No rebound or guarding. Genitourinary: No CVA tenderness. Musculoskeletal: Nontender with normal range of motion in all extremities. No cyanosis, or erythema of extremities. Neurologic: Normal  speech and language. Face is symmetric. Moving all extremities. No gross focal neurologic deficits . Skin: Skin is warm, dry.  No rash or ulcers Psychiatric: Mood and affect are normal Speech and behavior are normal   Labs on Admission: I have personally reviewed following labs and imaging studies  CBC: Recent Labs  Lab 12/12/19 2134  WBC 14.8*  HGB 10.8*  HCT 33.0*  MCV 118.3*  PLT 932   Basic Metabolic Panel: Recent Labs  Lab 12/12/19 2134  NA 131*  K 2.4*  CL 88*  CO2 16*  GLUCOSE 93  BUN 15  CREATININE 0.92  CALCIUM 8.1*  MG 1.6*   GFR: Estimated Creatinine Clearance: 60.7 mL/min (by C-G formula based on SCr of 0.92 mg/dL). Liver Function Tests: Recent Labs  Lab 12/12/19 2134  AST 248*  ALT 75*  ALKPHOS 352*  BILITOT 16.7*  PROT 6.4*  ALBUMIN 2.5*   No results for input(s): LIPASE, AMYLASE in the last 168 hours. Recent Labs  Lab 12/12/19 2313  AMMONIA 91*   Coagulation Profile: Recent Labs  Lab 12/12/19 2134  INR 1.1   Cardiac Enzymes: No results for input(s): CKTOTAL, CKMB, CKMBINDEX, TROPONINI in the last 168 hours. BNP (last 3 results) No results for input(s): PROBNP in the last 8760 hours. HbA1C: No results for input(s): HGBA1C in the last 72 hours. CBG: No results for input(s): GLUCAP in the last 168 hours. Lipid Profile: No results for input(s): CHOL, HDL, LDLCALC, TRIG, CHOLHDL, LDLDIRECT in the last 72 hours. Thyroid Function Tests: No results for input(s): TSH, T4TOTAL, FREET4, T3FREE, THYROIDAB in the last 72 hours. Anemia Panel: No results for input(s): VITAMINB12, FOLATE, FERRITIN, TIBC, IRON, RETICCTPCT in the last 72 hours. Urine analysis:    Component Value Date/Time   BILIRUBINUR neg 07/17/2019 0934   PROTEINUR Positive (A) 07/17/2019 0934   UROBILINOGEN 0.2 07/17/2019 0934   NITRITE neg 07/17/2019 0934   LEUKOCYTESUR Negative 07/17/2019 0934    Radiological Exams on Admission: CT ABDOMEN PELVIS W CONTRAST  Result  Date: 12/12/2019 CLINICAL DATA:  Elevated LFTs and bilirubin. Concern for new liver failure. EXAM: CT ABDOMEN AND PELVIS WITH CONTRAST TECHNIQUE: Multidetector CT imaging of the abdomen and pelvis was performed using the standard protocol following bolus administration of intravenous contrast. CONTRAST:  166m OMNIPAQUE IOHEXOL 300 MG/ML  SOLN COMPARISON:  Abdominal ultrasound 07/22/2019.  CT 11/26/2018 FINDINGS: Lower chest: No focal airspace disease or pleural effusion. Heart is normal in size. There are coronary artery calcifications. Hepatobiliary: Enlarged liver spanning 24 cm cranial caudal. Progressive hepatomegaly from prior exam. Severely decreased hepatic parenchyma consistent with advanced steatosis. Minimal focal fatty sparing adjacent to the gallbladder fossa. No evidence of discrete focal lesion. No definite capsular nodularity. Minimal high-density gallbladder contents may represent sludge or small stones. There is no pericholecystic inflammation. No biliary dilatation. Recannulized umbilical vein. Pancreas: No ductal dilatation or inflammation. There is no evidence of pancreatic mass. Spleen: Normal in size without focal abnormality. Adrenals/Urinary Tract: Normal adrenal glands. Nonobstructing stones in the left kidney. No hydronephrosis. Mild cortical scarring in the lower right kidney. Urinary bladder is partially distended. No bladder wall thickening. Stomach/Bowel: Paraesophageal and proximal perigastric varices. Stomach is decompressed. 6 mm intramural lipoma in the third portion of the duodenum, nonobstructive in incidental. Moderate length segment of small bowel wall thickening and edema in the lower abdomen. No obstruction. Appendix not confidently visualized. Submucosal fatty infiltration involving the cecum and ascending colon. Small volume of colonic stool. Diverticulosis in the descending and sigmoid colon without diverticulitis. Vascular/Lymphatic: Age advanced aortic atherosclerosis.  No aortic aneurysm. Patent portal and splenic vein. Recannulated umbilical vein with collaterals coursing into the inferior epigastric cyst in the right lower abdomen. Few additional portosystemic collaterals in the abdomen. Enlarged periportal node measuring 11 mm, similar to prior and likely reactive. Reproductive: Uterus and bilateral adnexa are unremarkable. Other: Small volume abdominopelvic ascites.  No free air. Musculoskeletal: Degenerative disc disease at L5-S1. Suggestion of early avascular necrosis of the left femoral head without subchondral collapse. There are no acute or suspicious osseous abnormalities. IMPRESSION: 1. Hepatomegaly and severe hepatic steatosis. Progressive hepatomegaly from prior exam. No discrete focal lesion. 2. Portal hypertension with recanalized umbilical vein, paraesophageal and proximal perigastric varices, and small volume abdominopelvic ascites. 3. No biliary dilatation.  No pancreatic mass. 4. Moderate length segment of small bowel wall thickening and edema in the lower abdomen, consistent with enteritis, likely reactive in the setting with ascites. 5. Nonobstructing left renal stones. 6. Colonic diverticulosis without diverticulitis. 7. Aortic atherosclerosis is age advanced. Aortic Atherosclerosis (ICD10-I70.0). Electronically Signed   By: Keith Rake M.D.   On: 12/12/2019 23:56    EKG: Independently reviewed. Interpretation : Normal sinus rhythm  Assessment/Plan  59 year old female with a history of HTN, bipolar mood disorder, asthma and heavy alcohol use, presenting with jaundice and acute confusion.  Principal Problem:   Acute hepatic encephalopathy   Alcoholic ketoacidosis   Alcoholic steatohepatitis -Patient presenting with several month history of jaundice, 1 week history of shortness of breath and several day history of confusion -Elevated LFTs and alk phos with bili 9.6, ammonia 91.  Anion gap 27 -CT abdomen and pelvis showing hepatomegaly with  severe hepatic steatosis, small volume ascites and portal hypertension -Had ultrasound in February 2021 for elevated LFTs that showed steatosis of the liver -Lactulose 3 times daily -IV hydration for alcoholic ketoacidosis -Consider right upper quadrant sonogram -Alcohol cessation counseling -Cirrhosis suspected might benefit from GI referral/consult    Hypokalemia -Potassium 2.8 -Receiving IV and oral supplementation.  Continue to monitor    Hypomagnesemia -Magnesium 1.6 -Received IV magnesium in the emergency room    Bipolar disorder ([place]) -On lamotrigine    Essential hypertension -Hold antihypertensives, hydrochlorothiazide for now    Alcohol use disorder, moderate, dependence (HCC) -CIWA withdrawal protocol -Thiamine folate and MVI -Tobacco cessation counseling    Macrocytic anemia -Hemoglobin 10.8 with MCV of 118 -Follow anemia panel    DVT prophylaxis: Lovenox  Code Status: full code  Family Communication:  none  Disposition Plan: Back to previous home environment Consults called: none  Status:obs    Athena Masse MD [facility]     12/13/2019, 12:58 AM

## 2019-12-13 NOTE — TOC Initial Note (Addendum)
Transition of Care [facility]) - Initial/Assessment Note    Patient Details  Name: Katherine Goodwin MRN: [id number] Date of Birth: 1961/03/10  Transition of [facility]) CM/SW Contact:    Su Hilt, RN Phone Number: 12/13/2019, 2:00 PM  Clinical Narrative:                 Met with the patient and her daughter in the room to discuss DC needs and plan She has a PCP in place and stated that she is up to date with them, she gets her medications at [facility] She is a heavy drinker and stated that she is not interested in rehab or AA, I provided a list of resources to the patient and her daughter, She needs a rolling walker and a 3 in 1 at home due to weakness, I called Zack at [facility] and arranged, I asked her if she would be open to going to Outpatient PT and she stated that she would, a PT eval was requested as well, TOC will continue to follow for needs  Expected Discharge Plan: Home/Self Care Barriers to Discharge: Continued Medical Work up, No [facility] will accept this patient   Patient Goals and CMS Choice Patient states their goals for this hospitalization and ongoing recovery are:: go home      Expected Discharge Plan and Services Expected Discharge Plan: Home/Self Care   Discharge Planning Services: CM Consult   Living arrangements for the past 2 months: Single Family Home                 DME Arranged: 3-N-1, Walker rolling DME Agency: [employer] Date DME Agency Contacted: 12/13/19 Time DME Agency Contacted: [phone] Representative spoke with at DME Agency: [place] Arranged: NA          Prior Living Arrangements/Services Living arrangements for the past 2 months: Single Family Home Lives with:: Spouse Patient language and need for interpreter reviewed:: Yes Do you feel safe going back to the place where you live?: Yes      Need for Family Participation in Patient Care: No (Comment) Care giver support system in place?: Yes (comment)   Criminal  Activity/Legal Involvement Pertinent to Current Situation/Hospitalization: No - Comment as needed  Activities of Daily Living Home Assistive Devices/Equipment: None ADL Screening (condition at time of admission) Patient's cognitive ability adequate to safely complete daily activities?: Yes Is the patient deaf or have difficulty hearing?: No Does the patient have difficulty seeing, even when wearing glasses/contacts?: No Does the patient have difficulty concentrating, remembering, or making decisions?: No Patient able to express need for assistance with ADLs?: Yes Does the patient have difficulty dressing or bathing?: Yes Independently performs ADLs?: No Communication: Independent Dressing (OT): Needs assistance Is this a change from baseline?: Pre-admission baseline Grooming: Needs assistance Is this a change from baseline?: Pre-admission baseline Feeding: Independent Bathing: Needs assistance Is this a change from baseline?: Pre-admission baseline Toileting: Needs assistance Is this a change from baseline?: Pre-admission baseline In/Out Bed: Needs assistance Is this a change from baseline?: Pre-admission baseline Walks in Home: Independent Does the patient have difficulty walking or climbing stairs?: Yes Weakness of Legs: Both Weakness of Arms/Hands: Both  Permission Sought/Granted   Permission granted to share information with : Yes, Verbal Permission Granted              Emotional Assessment Appearance:: Appears stated age Attitude/Demeanor/Rapport: Engaged Affect (typically observed): Appropriate Orientation: : Oriented to Situation, Oriented to  Time, Oriented to  Place, Oriented to Self Alcohol / Substance Use: Alcohol Use Psych Involvement: No (comment) (requested consult)  Admission diagnosis:  Hepatic encephalopathy (HCC) [K72.90] Hypokalemia [E87.6] Hypomagnesemia [H84.10] Alcoholic cirrhosis of liver with ascites (HCC) [K70.31] Acute hepatic encephalopathy  [K72.00] Anemia, unspecified type [D64.9] Patient Active Problem List   Diagnosis Date Noted  . Acute hepatic encephalopathy 12/13/2019  . Alcoholic ketoacidosis 85/79/0793  . Alcoholic steatohepatitis 10/91/4560  . Hypokalemia 12/13/2019  . Hypomagnesemia 12/13/2019  . Alcohol use disorder, moderate, dependence ([place]) 12/13/2019  . Macrocytic anemia 12/13/2019  . Mild asthma 09/18/2019  . Chronic venous insufficiency 04/04/2019  . Leg pain 04/04/2019  . Essential hypertension 04/04/2019  . PAD (peripheral artery disease) ([place]) 03/25/2019  . Localized edema 03/25/2019  . Tubular adenoma of colon 12/18/2018  . Family history of colonic polyps   . Duodenitis   . Gastritis without bleeding   . Melena 12/03/2018  . Hepatic steatosis 12/03/2018  . Tobacco dependence 06/12/2015  . Elevated blood pressure 06/12/2015  . 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 Dizziness 10/17/2013  . Abnormal EKG 10/17/2013  . Back pain 10/17/2013  . Bipolar disorder ([place]) 10/17/2013  . Prolonged QT interval 10/17/2013  . Near syncope 10/17/2013   PCP:  Glean Hess, MD Pharmacy:   [facility] [address]), [place] [address] [place]) [place] [address] Phone: [phone] Fax: [phone]     Social Determinants of Health (SDOH) Interventions    Readmission Risk Interventions No flowsheet data found.

## 2019-12-13 NOTE — Consult Note (Signed)
Katherine Goodwin , MD [address], [place], [place], [place], [address], [place], [place], [place], [address] Phone: [phone]  Fax: [phone]  Consultation  Referring Provider:     Dr Sloan Leiter Primary Care Physician:  Glean Hess, MD Primary Gastroenterologist:  Dr. Allen Norris          Reason for Consultation:     Abnormal LFT's  Date of Admission:  12/12/2019 Date of Consultation:  12/13/2019         HPI:   Katherine Goodwin is a 59 y.o. female has previously been seen in June 2020 by Dr. Allen Norris.  At that time was consuming 4 shots of hard liquor a day for about 15 to 20 years and also had abnormal transaminases.  She also had abdominal pain at that time.  Had been using NSAIDs at that time.  Plan at that time was for an EGD and colonoscopy.  She was advised to stop drinking alcohol.  She underwent an upper endoscopy in June 2020 that demonstrated erythema in the gastric antrum and inflammation in the duodenal bulb.  She also underwent a colonoscopy at the same timeAnd few diminutive polyps were resected.One of the polyps was a tubular adenoma and biopsies of stomach demonstrated chronic inactive gastritis negative for H. Pylori.On this admission she was admitted on 12/13/2019 with confusion.  CT abdomen and pelvis demonstrated hepatomegaly and severe hepatic steatosis moderate in length of small bowel wall thickening and edema consistent with enteritis and possible reactive ascites.  On admission INR 1.2, potassium 2.9, albumin 2.3, AST 222, ALT 70, alkaline phosphatase 333 and total bilirubin 15.2.  Hemoglobin 10.8 g with a platelet count of 181 and white cell count of 14.8.  On CT abdomen features of portal hypertension and recanalized umbilical vein with paraesophageal and proximal perigastric varices seen.  I went into the room to see her she was accompanied by her husband.  Husband speaks Spanish and I used an interpreter to speak to the husband in addition to the wife.   Patient denies any complaints at this point of time.  The husband said he brought her in because of confusion and severe depression.  She drinks 1 bottle of tequila every week cut down recently previously used to consume 1 bottle every 3 days which she has done for many years.  He provides the alcohol for her.  He states that if he does not get the alcohol she becomes very violent.  Presently she is not confused and able to answer questions.  Denies any other complaints.  He noticed that she turned yellow her few weeks back.  Denies any abdominal pain or fever.  Denies any excess Tylenol usage.  Occasionally uses NSAIDs.  No over-the-counter medications.  Past Medical History:  Diagnosis Date  . Asthma   . Depression   . GERD (gastroesophageal reflux disease)   . Hypertension   . Kidney stones   . Osteoarthrosis, unspecified whether generalized or localized, involving lower leg    leg  . Other bipolar disorders   . Seizure ([place])    none since age 5  . Tobacco abuse   . Trigger finger   . Wears dentures    partial upper    Past Surgical History:  Procedure Laterality Date  . COLONOSCOPY WITH PROPOFOL N/A 12/14/2018   Procedure: COLONOSCOPY WITH PROPOFOL;  Surgeon: Lucilla Lame, MD;  Location: [facility];  Service: Endoscopy;  Laterality: N/A;  . ESOPHAGOGASTRODUODENOSCOPY (EGD) WITH  PROPOFOL N/A 12/14/2018   Procedure: ESOPHAGOGASTRODUODENOSCOPY (EGD) WITH BIOPSY;  Surgeon: Lucilla Lame, MD;  Location: [facility];  Service: Endoscopy;  Laterality: N/A;  . FOOT SURGERY     right foot  . POLYPECTOMY N/A 12/14/2018   Procedure: POLYPECTOMY;  Surgeon: Lucilla Lame, MD;  Location: [facility];  Service: Endoscopy;  Laterality: N/A;    Prior to Admission medications   Medication Sig Start Date End Date Taking? Authorizing Provider  diphenhydrAMINE (BENADRYL) 25 MG tablet Take 25 mg by mouth every 6 (six) hours as needed for itching or allergies.    Yes [provider]  ibuprofen (ADVIL,MOTRIN) 800 MG tablet Take 800 mg by mouth every 8 (eight) hours as needed.   Yes [provider]  albuterol (VENTOLIN HFA) 108 (90 Base) MCG/ACT inhaler Inhale 2 puffs into the lungs every 6 (six) hours as needed for wheezing or shortness of breath. Patient not taking: Reported on 12/13/2019 09/18/19   Glean Hess, MD  hydrochlorothiazide (HYDRODIURIL) 25 MG tablet Take 1 tablet (25 mg total) by mouth daily. Patient not taking: Reported on 12/13/2019 03/25/19   Glean Hess, MD  lamoTRIgine (LAMICTAL) 100 MG tablet Take 100 mg by mouth 2 (two) times daily. Patient not taking: Reported on 12/13/2019    [provider]  losartan (COZAAR) 50 MG tablet Take 1 tablet (50 mg total) by mouth daily. Patient not taking: Reported on 12/13/2019 07/17/19   Glean Hess, MD    Family History  Problem Relation Age of Onset  . Heart disease Father 47       CABG x 3  . Heart attack Father 63       MI  . Hypertension Mother   . Throat cancer Brother      Social History   Tobacco Use  . Smoking status: Current Every Day Smoker    Packs/day: 0.25    Years: 39.00    Pack years: 9.75    Types: Cigarettes  . Smokeless tobacco: Never Used  Vaping Use  . Vaping Use: Never used  Substance Use Topics  . Alcohol use: Yes    Alcohol/week: 7.0 standard drinks    Types: 3 Cans of beer, 4 Shots of liquor per week    Comment: Pt states 3-4 shots a day  . Drug use: Yes    Types: Marijuana    Comment: Smokes Marijuana 2-3 times a month.     Allergies as of 12/12/2019 - Review Complete 12/12/2019  Allergen Reaction Noted  . Prozac [fluoxetine hcl] Other (See Comments) 03/04/2017  . Seroquel [quetiapine fumarate] Other (See Comments) 03/04/2017  . Sulfa antibiotics Nausea And Vomiting 12/10/2018    Review of Systems:    All systems reviewed and negative except where noted in HPI.   Physical Exam:  Vital signs in last 24 hours: Temp:   [97.7 F (36.5 C)-98.2 F (36.8 C)] 97.8 F (36.6 C) (06/25 0753) Pulse Rate:  [85-98] 86 (06/25 0753) Resp:  [15-16] 16 (06/25 0753) BP: (97-126)/(58-87) 97/75 (06/25 0753) SpO2:  [96 %-100 %] 97 % (06/25 0753) Weight:  [68 kg] 68 kg (06/25 0252) Last BM Date: 12/13/19 General: Comfortable but flat affect Head:  Normocephalic and atraumatic. Eyes:   No icterus.   Conjunctiva icteric Ears:  Normal auditory acuity. Neck:  Supple; no masses or thyroidomegaly Lungs: Respirations even and unlabored. Lungs clear to auscultation bilaterally.   No wheezes, crackles, or rhonchi.  Heart:  Regular rate and rhythm;  Without murmur, clicks, rubs or gallops Abdomen:  Soft, mild distention, nontender. Normal bowel sounds. No appreciable masses or hepatomegaly.  No rebound or guarding.  Neurologic:  Alert and oriented x3;  grossly normal neurologically. Skin:  Intact without significant lesions or rashes. Cervical Nodes:  No significant cervical adenopathy. Psych: Flat affect answers questions with some reluctance  LAB RESULTS: Recent Labs    12/12/19 2134 12/13/19 0541  WBC 14.8* 13.4*  HGB 10.8* 9.1*  HCT 33.0* 26.4*  PLT 181 164   BMET Recent Labs    12/12/19 2134 12/13/19 0541  NA 131* 131*  K 2.4* 2.9*  CL 88* 91*  CO2 16* 24  GLUCOSE 93 117*  BUN 15 16  CREATININE 0.92 0.84  CALCIUM 8.1* 7.8*   LFT Recent Labs    12/13/19 0541  PROT 5.7*  ALBUMIN 2.3*  AST 222*  ALT 70*  ALKPHOS 333*  BILITOT 15.2*   PT/INR Recent Labs    12/12/19 2134 12/13/19 0541  LABPROT 13.6 14.6  INR 1.1 1.2    STUDIES: CT ABDOMEN PELVIS W CONTRAST  Result Date: 12/12/2019 CLINICAL DATA:  Elevated LFTs and bilirubin. Concern for new liver failure. EXAM: CT ABDOMEN AND PELVIS WITH CONTRAST TECHNIQUE: Multidetector CT imaging of the abdomen and pelvis was performed using the standard protocol following bolus administration of intravenous contrast. CONTRAST:  137mL OMNIPAQUE IOHEXOL 300  MG/ML  SOLN COMPARISON:  Abdominal ultrasound 07/22/2019.  CT 11/26/2018 FINDINGS: Lower chest: No focal airspace disease or pleural effusion. Heart is normal in size. There are coronary artery calcifications. Hepatobiliary: Enlarged liver spanning 24 cm cranial caudal. Progressive hepatomegaly from prior exam. Severely decreased hepatic parenchyma consistent with advanced steatosis. Minimal focal fatty sparing adjacent to the gallbladder fossa. No evidence of discrete focal lesion. No definite capsular nodularity. Minimal high-density gallbladder contents may represent sludge or small stones. There is no pericholecystic inflammation. No biliary dilatation. Recannulized umbilical vein. Pancreas: No ductal dilatation or inflammation. There is no evidence of pancreatic mass. Spleen: Normal in size without focal abnormality. Adrenals/Urinary Tract: Normal adrenal glands. Nonobstructing stones in the left kidney. No hydronephrosis. Mild cortical scarring in the lower right kidney. Urinary bladder is partially distended. No bladder wall thickening. Stomach/Bowel: Paraesophageal and proximal perigastric varices. Stomach is decompressed. 6 mm intramural lipoma in the third portion of the duodenum, nonobstructive in incidental. Moderate length segment of small bowel wall thickening and edema in the lower abdomen. No obstruction. Appendix not confidently visualized. Submucosal fatty infiltration involving the cecum and ascending colon. Small volume of colonic stool. Diverticulosis in the descending and sigmoid colon without diverticulitis. Vascular/Lymphatic: Age advanced aortic atherosclerosis. No aortic aneurysm. Patent portal and splenic vein. Recannulated umbilical vein with collaterals coursing into the inferior epigastric cyst in the right lower abdomen. Few additional portosystemic collaterals in the abdomen. Enlarged periportal node measuring 11 mm, similar to prior and likely reactive. Reproductive: Uterus and  bilateral adnexa are unremarkable. Other: Small volume abdominopelvic ascites.  No free air. Musculoskeletal: Degenerative disc disease at L5-S1. Suggestion of early avascular necrosis of the left femoral head without subchondral collapse. There are no acute or suspicious osseous abnormalities. IMPRESSION: 1. Hepatomegaly and severe hepatic steatosis. Progressive hepatomegaly from prior exam. No discrete focal lesion. 2. Portal hypertension with recanalized umbilical vein, paraesophageal and proximal perigastric varices, and small volume abdominopelvic ascites. 3. No biliary dilatation.  No pancreatic mass. 4. Moderate length segment of small bowel wall thickening and edema in the lower abdomen, consistent with enteritis, likely reactive  in the setting with ascites. 5. Nonobstructing left renal stones. 6. Colonic diverticulosis without diverticulitis. 7. Aortic atherosclerosis is age advanced. Aortic Atherosclerosis (ICD10-I70.0). Electronically Signed   By: Keith Rake M.D.   On: 12/12/2019 23:56      Impression / Plan:   YZABELLA CRUNK is a 59 y.o. y/o female with a history of alcohol abuse admitted with confusion , elevated LFT's and found to have features of portal hypertension on imaging , also has enteritis. ?H/o long term NSAID's, has possible alcoholic hepatitis, ascites, hepatic encephelopathy.   DF 29.9 and would not meet criteria for steroids.  Plan 1.  Abnormal LFTs: Very likely secondary to alcohol but would perform complete viral hepatitis work-up. 2.  Strongly to stop drinking alcohol.  Suggested to obtain help from alcoholic Anonymous 3.  As an outpatient will require EGD to screen for esophageal varices due to decompensation. 4.  Commence on lactulose titrated to 2 soft bowel movements per day for hepatic encephalopathy also suggest to add Xifaxan.  Avoid diarrhea as dehydration and hypokalemia along with low other electrolytes can worsen hepatic encephalopathy. 5.  No utility in  checking serial ammonia levels.  Hepatic encephalopathy is a clinical diagnosis. 6.  Stop all NSAIDs as it can contribute to anemia and increase the risk of bleeding 7.  Replace all electrolytes as abnormal electrolytes causes matabolic encephelopathy 8.  Avoid narcotics 9.  Continue IV vitamins and watch for alcohol withdrawal 10. Urine analysis, blood cultures, abdominal paracentesis to r/o SBP as part of infection screen to evaluate for confusion/encephelopathy  11.  According to the husband she suffers from severe depression and would benefit from inpatient psychiatric evaluation  Thank you for involving me in the care of this patient.      LOS: 0 days   Katherine Bellows, MD  12/13/2019, 10:12 AM

## 2019-12-13 NOTE — Progress Notes (Signed)
PROGRESS NOTE    Katherine Goodwin  PTW:[id number] DOB: [birth year] DOA: 12/12/2019 PCP: Glean Hess, MD    Brief Narrative:  59 year old female with history of hypertension, asthma, bipolar disorder, heavy alcohol use who drinks about 3-4 shots of liquor per day, last drink about 48 hours ago brought into the emergency room by her daughter because of concern of confusion.  Recently worsening memory, abdominal bloating and jaundiced. In the emergency room, patient was reportedly confused.  Vital signs within normal limits.  Bilirubin 16.7.  Ammonia 91.  Potassium 2.4.  INR normal.  CT scan abdomen pelvis showed hepatomegaly, severe hepatic steatosis portal hypertension and small volume ascites.  Patient was given magnesium potassium lactulose and admitted to the hospital.   Assessment & Plan:   Principal Problem:   Acute hepatic encephalopathy Active Problems:   Bipolar disorder ([place])   Essential hypertension   Alcoholic ketoacidosis   Alcoholic steatohepatitis   Hypokalemia   Hypomagnesemia   Alcohol use disorder, moderate, dependence (H[facility])   Macrocytic anemia  Acute metabolic encephalopathy, acute hepatic encephalopathy: Suspect alcoholic chronic liver disease with acute decompensation. Currently hemodynamically stable. Mental status is improving. Ammonia is 91, started on lactulose with good response and improving mentation.  Continue lactulose with aim of having more than 3 loose bowel movements a day. She has minimal ascites fluid, will ask interventional radiology to drain if possible and do labs including cytology. Madrey discriminant function score is 19, currently no benefit with his steroids. Will ask gastroenterology to help with management.  Overall poor function and poor outcome. She is not a liver transplant candidate.  Hepatitis panels are negative.   Hypokalemia and hypomagnesemia: Replace aggressively and monitor levels.  Replaced adequately to keep magnesium  more than 2 and potassium more than 4.  Alcoholism with risk of alcohol withdrawal: Patient is very high risk of alcohol withdrawal.  Currently remains fairly stable and without florid evidence of withdrawal. Continue benzodiazepine protocol.  Fall precautions and seizure precautions.  On multivitamins.  Called and discussed with gastroenterology.   DVT prophylaxis: enoxaparin (LOVENOX) injection 40 mg Start: 12/13/19 1000   Code Status: Full code Family Communication: No family at bedside Disposition Plan: Status is: Observation  The patient will require care spanning > 2 midnights and should be moved to inpatient because: Persistent severe electrolyte disturbances, Altered mental status and Inpatient level of care appropriate due to severity of illness  Dispo: The patient is from: Home              Anticipated d/c is to: Home              Anticipated d/c date is: 3 days              Patient currently is not medically stable to d/c.         Consultants:   Gastroenterology, called and discussed case  Procedures:   Sending for ultrasound-guided paracentesis  Antimicrobials:   None.   Subjective: Patient seen and examined.  No overnight events.  Today she was alert and awake.  She expresses that she is drinking a lot.  Feels her belly is bloated.  Denies any nausea vomiting.  She was more worried about going to bathroom multiple times after taking lactulose.  Objective: Vitals:   12/12/19 2300 12/13/19 0200 12/13/19 0252 12/13/19 0753  BP: 126/74 (!) 98/58 112/66 97/75  Pulse: 98 88 85 86  Resp:  16 15 16   Temp:   98.2 F (  36.8 C) 97.8 F (36.6 C)  TempSrc:   Oral Oral  SpO2: 99% 96% 100% 97%  Weight:   68 kg   Height:   5\' 5"  (1.651 m)     Intake/Output Summary (Last 24 hours) at 12/13/2019 1001 Last data filed at 12/13/2019 0454 Gross per 24 hour  Intake 1435.74 ml  Output --  Net 1435.74 ml   Filed Weights   12/12/19 2109 12/13/19 0252  Weight: 68 kg  68 kg    Examination:  General exam: Appears comfortable, sick looking, jaundiced. Respiratory system: Clear to auscultation. Respiratory effort normal. Cardiovascular system: S1 & S2 heard, RRR. Gastrointestinal system: Abdomen is nondistended, soft and nontender.  She has palpable nontender liver about 5 cm below the costal line.  No palpable fluid thrill present. Central nervous system: Alert and oriented. No focal neurological deficits. Extremities: Symmetric 5 x 5 power. Skin: No rashes, lesions or ulcers Psychiatry: Judgement and insight appear normal. Mood & affect flat.    Data Reviewed: I have personally reviewed following labs and imaging studies  CBC: Recent Labs  Lab 12/12/19 2134 12/13/19 0541  WBC 14.8* 13.4*  HGB 10.8* 9.1*  HCT 33.0* 26.4*  MCV 118.3* 110.9*  PLT 181 098   Basic Metabolic Panel: Recent Labs  Lab 12/12/19 2134 12/13/19 0541  NA 131* 131*  K 2.4* 2.9*  CL 88* 91*  CO2 16* 24  GLUCOSE 93 117*  BUN 15 16  CREATININE 0.92 0.84  CALCIUM 8.1* 7.8*  MG 1.6* 2.1  PHOS  --  UNABLE TO REPORT DUE TO ICTERUS.PMF   GFR: Estimated Creatinine Clearance: 66.5 mL/min (by C-G formula based on SCr of 0.84 mg/dL). Liver Function Tests: Recent Labs  Lab 12/12/19 2134 12/13/19 0541  AST 248* 222*  ALT 75* 70*  ALKPHOS 352* 333*  BILITOT 16.7* 15.2*  PROT 6.4* 5.7*  ALBUMIN 2.5* 2.3*   No results for input(s): LIPASE, AMYLASE in the last 168 hours. Recent Labs  Lab 12/12/19 2313  AMMONIA 91*   Coagulation Profile: Recent Labs  Lab 12/12/19 2134 12/13/19 0541  INR 1.1 1.2   Cardiac Enzymes: No results for input(s): CKTOTAL, CKMB, CKMBINDEX, TROPONINI in the last 168 hours. BNP (last 3 results) No results for input(s): PROBNP in the last 8760 hours. HbA1C: No results for input(s): HGBA1C in the last 72 hours. CBG: No results for input(s): GLUCAP in the last 168 hours. Lipid Profile: No results for input(s): CHOL, HDL, LDLCALC,  TRIG, CHOLHDL, LDLDIRECT in the last 72 hours. Thyroid Function Tests: No results for input(s): TSH, T4TOTAL, FREET4, T3FREE, THYROIDAB in the last 72 hours. Anemia Panel: Recent Labs    12/13/19 0541  FOLATE 5.6*  FERRITIN 388*  TIBC 154*  IRON 96   Sepsis Labs: No results for input(s): PROCALCITON, LATICACIDVEN in the last 168 hours.  Recent Results (from the past 240 hour(s))  SARS Coronavirus 2 by RT PCR (hospital order, performed in [facility] hospital lab) Nasopharyngeal Nasopharyngeal Swab     Status: None   Collection Time: 12/13/19 12:58 AM   Specimen: Nasopharyngeal Swab  Result Value Ref Range Status   SARS Coronavirus 2 NEGATIVE NEGATIVE Final    Comment: (NOTE) SARS-CoV-2 target nucleic acids are NOT DETECTED.  The SARS-CoV-2 RNA is generally detectable in upper and lower respiratory specimens during the acute phase of infection. The lowest concentration of SARS-CoV-2 viral copies this assay can detect is 250 copies / mL. A negative result does not preclude SARS-CoV-2 infection  and should not be used as the sole basis for treatment or other patient management decisions.  A negative result may occur with improper specimen collection / handling, submission of specimen other than nasopharyngeal swab, presence of viral mutation(s) within the areas targeted by this assay, and inadequate number of viral copies (<250 copies / mL). A negative result must be combined with clinical observations, patient history, and epidemiological information.  Fact Sheet for Patients:   StrictlyIdeas.no  Fact Sheet for Healthcare Providers: BankingDealers.co.za  This test is not yet approved or  cleared by the [place] FDA and has been authorized for detection and/or diagnosis of SARS-CoV-2 by FDA under an Emergency Use Authorization (EUA).  This EUA will remain in effect (meaning this test can be used) for the duration of  the COVID-19 declaration under Section 564(b)(1) of the Act, 21 U.S.C. section 360bbb-3(b)(1), unless the authorization is terminated or revoked sooner.  Performed at [facility], [address], [place], [place] [address]          Radiology Studies: CT ABDOMEN PELVIS W CONTRAST  Result Date: 12/12/2019 CLINICAL DATA:  Elevated LFTs and bilirubin. Concern for new liver failure. EXAM: CT ABDOMEN AND PELVIS WITH CONTRAST TECHNIQUE: Multidetector CT imaging of the abdomen and pelvis was performed using the standard protocol following bolus administration of intravenous contrast. CONTRAST:  117mL OMNIPAQUE IOHEXOL 300 MG/ML  SOLN COMPARISON:  Abdominal ultrasound 07/22/2019.  CT 11/26/2018 FINDINGS: Lower chest: No focal airspace disease or pleural effusion. Heart is normal in size. There are coronary artery calcifications. Hepatobiliary: Enlarged liver spanning 24 cm cranial caudal. Progressive hepatomegaly from prior exam. Severely decreased hepatic parenchyma consistent with advanced steatosis. Minimal focal fatty sparing adjacent to the gallbladder fossa. No evidence of discrete focal lesion. No definite capsular nodularity. Minimal high-density gallbladder contents may represent sludge or small stones. There is no pericholecystic inflammation. No biliary dilatation. Recannulized umbilical vein. Pancreas: No ductal dilatation or inflammation. There is no evidence of pancreatic mass. Spleen: Normal in size without focal abnormality. Adrenals/Urinary Tract: Normal adrenal glands. Nonobstructing stones in the left kidney. No hydronephrosis. Mild cortical scarring in the lower right kidney. Urinary bladder is partially distended. No bladder wall thickening. Stomach/Bowel: Paraesophageal and proximal perigastric varices. Stomach is decompressed. 6 mm intramural lipoma in the third portion of the duodenum, nonobstructive in incidental. Moderate length segment of small bowel wall thickening and  edema in the lower abdomen. No obstruction. Appendix not confidently visualized. Submucosal fatty infiltration involving the cecum and ascending colon. Small volume of colonic stool. Diverticulosis in the descending and sigmoid colon without diverticulitis. Vascular/Lymphatic: Age advanced aortic atherosclerosis. No aortic aneurysm. Patent portal and splenic vein. Recannulated umbilical vein with collaterals coursing into the inferior epigastric cyst in the right lower abdomen. Few additional portosystemic collaterals in the abdomen. Enlarged periportal node measuring 11 mm, similar to prior and likely reactive. Reproductive: Uterus and bilateral adnexa are unremarkable. Other: Small volume abdominopelvic ascites.  No free air. Musculoskeletal: Degenerative disc disease at L5-S1. Suggestion of early avascular necrosis of the left femoral head without subchondral collapse. There are no acute or suspicious osseous abnormalities. IMPRESSION: 1. Hepatomegaly and severe hepatic steatosis. Progressive hepatomegaly from prior exam. No discrete focal lesion. 2. Portal hypertension with recanalized umbilical vein, paraesophageal and proximal perigastric varices, and small volume abdominopelvic ascites. 3. No biliary dilatation.  No pancreatic mass. 4. Moderate length segment of small bowel wall thickening and edema in the lower abdomen, consistent with enteritis, likely reactive in the setting  with ascites. 5. Nonobstructing left renal stones. 6. Colonic diverticulosis without diverticulitis. 7. Aortic atherosclerosis is age advanced. Aortic Atherosclerosis (ICD10-I70.0). Electronically Signed   By: Keith Rake M.D.   On: 12/12/2019 23:56        Scheduled Meds:  enoxaparin (LOVENOX) injection  40 mg Subcutaneous T95Z   folic acid  1 mg Oral Daily   lactulose  30 g Oral TID   LORazepam  0-4 mg Intravenous Q6H   Followed by   Derrill Memo ON 12/15/2019] LORazepam  0-4 mg Intravenous Q12H   multivitamin with  minerals  1 tablet Oral Daily   potassium chloride  40 mEq Oral BID   thiamine  100 mg Oral Daily   Or   thiamine  100 mg Intravenous Daily   Continuous Infusions:  sodium chloride       LOS: 0 days    Time spent: Additional 30 minutes    Barb Merino, MD [facility] Pager [phone]

## 2019-12-13 NOTE — ED Notes (Signed)
Pt refusing COVID swab at this time, stating "I don't want y'all's test"

## 2019-12-13 NOTE — Progress Notes (Signed)
Patient states her husband helps her with bathing, toileting, cooking, dressing, and other day to day activities because "my arms and legs are so weak." Patient also states she does not take any medication except occasionally ibuprofen for pain, and a benadryl every night to help her sleep. TOC consult ordered.

## 2019-12-14 DIAGNOSIS — K7010 Alcoholic hepatitis without ascites: Secondary | ICD-10-CM

## 2019-12-14 DIAGNOSIS — K7031 Alcoholic cirrhosis of liver with ascites: Secondary | ICD-10-CM

## 2019-12-14 LAB — PROTIME-INR
INR: 1.2 (ref 0.8–1.2)
Prothrombin Time: 14.8 seconds (ref 11.4–15.2)

## 2019-12-14 LAB — COMPREHENSIVE METABOLIC PANEL
ALT: 73 U/L — ABNORMAL HIGH (ref 0–44)
AST: 233 U/L — ABNORMAL HIGH (ref 15–41)
Albumin: 2.2 g/dL — ABNORMAL LOW (ref 3.5–5.0)
Alkaline Phosphatase: 341 U/L — ABNORMAL HIGH (ref 38–126)
Anion gap: 10 (ref 5–15)
BUN: 13 mg/dL (ref 6–20)
CO2: 26 mmol/L (ref 22–32)
Calcium: 7.9 mg/dL — ABNORMAL LOW (ref 8.9–10.3)
Chloride: 97 mmol/L — ABNORMAL LOW (ref 98–111)
Creatinine, Ser: 0.75 mg/dL (ref 0.44–1.00)
GFR calc Af Amer: >60 mL/min (ref >60)
GFR calc non Af Amer: >60 mL/min (ref >60)
Glucose, Bld: 99 mg/dL (ref 70–99)
Potassium: 3.7 mmol/L (ref 3.5–5.1)
Sodium: 133 mmol/L — ABNORMAL LOW (ref 135–145)
Total Bilirubin: 14.9 mg/dL — ABNORMAL HIGH (ref 0.3–1.2)
Total Protein: 5.8 g/dL — ABNORMAL LOW (ref 6.5–8.1)

## 2019-12-14 LAB — CBC WITH DIFFERENTIAL/PLATELET
Abs Immature Granulocytes: 0.24 10*3/uL — ABNORMAL HIGH (ref 0.00–0.07)
Basophils Absolute: 0.1 10*3/uL (ref 0.0–0.1)
Basophils Relative: 1 %
Eosinophils Absolute: 0.1 10*3/uL (ref 0.0–0.5)
Eosinophils Relative: 1 %
HCT: 27.8 % — ABNORMAL LOW (ref 36.0–46.0)
Hemoglobin: 9.2 g/dL — ABNORMAL LOW (ref 12.0–15.0)
Immature Granulocytes: 2 %
Lymphocytes Relative: 13 %
Lymphs Abs: 1.4 10*3/uL (ref 0.7–4.0)
MCH: 38.7 pg — ABNORMAL HIGH (ref 26.0–34.0)
MCHC: 33.1 g/dL (ref 30.0–36.0)
MCV: 116.8 fL — ABNORMAL HIGH (ref 80.0–100.0)
Monocytes Absolute: 1 10*3/uL (ref 0.1–1.0)
Monocytes Relative: 9 %
Neutro Abs: 8.3 10*3/uL — ABNORMAL HIGH (ref 1.7–7.7)
Neutrophils Relative %: 74 %
Platelets: 147 10*3/uL — ABNORMAL LOW (ref 150–400)
RBC: 2.38 MIL/uL — ABNORMAL LOW (ref 3.87–5.11)
RDW: 17 % — ABNORMAL HIGH (ref 11.5–15.5)
WBC: 11 10*3/uL — ABNORMAL HIGH (ref 4.0–10.5)
nRBC: 0 % (ref 0.0–0.2)

## 2019-12-14 LAB — AMMONIA: Ammonia: 41 umol/L — ABNORMAL HIGH (ref 9–35)

## 2019-12-14 LAB — HEPATITIS B DNA, ULTRAQUANTITATIVE, PCR
HBV DNA SERPL PCR-ACNC: NOT DETECTED IU/mL
HBV DNA SERPL PCR-LOG IU: UNDETERMINED log10 IU/mL

## 2019-12-14 LAB — PHOSPHORUS: Phosphorus: UNDETERMINED mg/dL (ref 2.5–4.6)

## 2019-12-14 LAB — HEPATITIS B E ANTIGEN: Hep B E Ag: NEGATIVE

## 2019-12-14 LAB — MAGNESIUM: Magnesium: 2 mg/dL (ref 1.7–2.4)

## 2019-12-14 MED ORDER — RIFAXIMIN 550 MG PO TABS
550.0000 mg | ORAL_TABLET | Freq: Two times a day (BID) | ORAL | Status: DC
  Administered 2019-12-14 – 2019-12-15 (×3): 550 mg via ORAL
  Filled 2019-12-14 (×3): qty 1

## 2019-12-14 MED ORDER — LACTULOSE 10 GM/15ML PO SOLN: 20.0000 g | Freq: Three times a day (TID) | ORAL | Status: DC | PRN

## 2019-12-14 MED ORDER — SPIRONOLACTONE 25 MG PO TABS
50.0000 mg | ORAL_TABLET | Freq: Every day | ORAL | Status: DC
  Administered 2019-12-14 – 2019-12-15 (×2): 50 mg via ORAL
  Filled 2019-12-14 (×2): qty 2

## 2019-12-14 MED ORDER — BOOST / RESOURCE BREEZE PO LIQD CUSTOM
1.0000 | Freq: Three times a day (TID) | ORAL | Status: DC
  Administered 2019-12-14 – 2019-12-15 (×3): 1 via ORAL

## 2019-12-14 NOTE — Progress Notes (Signed)
PROGRESS NOTE    [name]  FAO:[id number] DOB: [birth year] DOA: 12/12/2019 PCP: Katherine Hess, MD    Brief Narrative:  59 year old female with history of hypertension, asthma, bipolar disorder, heavy alcohol use who drinks about 3-4 shots of liquor per day, last drink about 48 hours ago brought into the emergency room by her daughter because of concern of confusion.  Recently worsening memory, abdominal bloating and jaundiced. In the emergency room, patient was reportedly confused.  Vital signs within normal limits.  Bilirubin 16.7.  Ammonia 91.  Potassium 2.4.  INR normal.  CT scan abdomen pelvis showed hepatomegaly, severe hepatic steatosis portal hypertension and small volume ascites.  Patient was given magnesium potassium lactulose and admitted to the hospital.   Assessment & Plan:   Principal Problem:   Acute hepatic encephalopathy Active Problems:   Bipolar disorder ([place])   Essential hypertension   Alcoholic ketoacidosis   Alcoholic steatohepatitis   Hypokalemia   Hypomagnesemia   Alcohol use disorder, moderate, dependence (HCC)   Macrocytic anemia   Liver failure, acute  Acute metabolic encephalopathy, acute hepatic encephalopathy:  Suspect alcoholic chronic liver disease with acute decompensation. Currently hemodynamically stable. Mental status is improving. Ammonia is 91- 41. on lactulose with good response and improving mentation.  Continue lactulose with aim of having more than 3 loose bowel movements a day. She has minimal ascites fluid, not amenable to paracentesis. Madrey discriminant function score is 19, currently no benefit with his steroids. Seen by GI.  Recommended supportive management. She is not a liver transplant candidate.  Hepatitis panels are negative.   Hypokalemia and hypomagnesemia: Replaced aggressively with improvement.   Alcoholism with risk of alcohol withdrawal: Patient is very high risk of alcohol withdrawal.  Currently remains fairly  stable and without florid evidence of withdrawal. Continue benzodiazepine protocol.  Fall precautions and seizure precautions.  On multivitamins.  Start mobilizing.  PT OT today.  If remains fairly stable, anticipate discharge home tomorrow with outpatient GI follow-up.  DVT prophylaxis: enoxaparin (LOVENOX) injection 40 mg Start: 12/13/19 1000   Code Status: Full code Family Communication: Husband at bedside.  6/25 Disposition Plan: Status is: Inpatient.  The patient will require care spanning > 2 midnights and should be moved to inpatient because: Persistent severe electrolyte disturbances, Altered mental status and Inpatient level of care appropriate due to severity of illness  Dispo: The patient is from: Home              Anticipated d/c is to: Home              Anticipated d/c date is: 2 days              Patient currently is not medically stable to d/c.         Consultants:   Gastroenterology,   Procedures:   None  Antimicrobials:   None.   Subjective: Seen and examined.  No overnight events.  Alert awake.  Denies any confusion.  Objective: Vitals:   12/13/19 2004 12/14/19 0016 12/14/19 0021 12/14/19 0611  BP: (!) 97/56 (!) 90/54 103/66 98/76  Pulse: 91 85 89 87  Resp: 18 16  18   Temp: 98.2 F (36.8 C) 97.9 F (36.6 C)  98.1 F (36.7 C)  TempSrc: Oral Oral  Oral  SpO2: 96% 96%  93%  Weight:      Height:        Intake/Output Summary (Last 24 hours) at 12/14/2019 1032 Last data filed at 12/14/2019 1010  Gross per 24 hour  Intake 600 ml  Output --  Net 600 ml   Filed Weights   12/12/19 2109 12/13/19 0252  Weight: 68 kg 68 kg    Examination:  General exam: Appears comfortable, sick looking, jaundiced. Respiratory system: Clear to auscultation. Respiratory effort normal. Cardiovascular system: S1 & S2 heard, RRR. Gastrointestinal system: Abdomen is nondistended, soft and nontender.  She has palpable nontender liver about 5 cm below the costal  line.  No palpable fluid thrill present. Central nervous system: Alert and oriented. No focal neurological deficits. Extremities: Symmetric 5 x 5 power.    Data Reviewed: I have personally reviewed following labs and imaging studies  CBC: Recent Labs  Lab 12/12/19 2134 12/13/19 0541 12/14/19 0911  WBC 14.8* 13.4* 11.0*  NEUTROABS  --   --  8.3*  HGB 10.8* 9.1* 9.2*  HCT 33.0* 26.4* 27.8*  MCV 118.3* 110.9* 116.8*  PLT 181 164 742*   Basic Metabolic Panel: Recent Labs  Lab 12/12/19 2134 12/13/19 0541 12/14/19 0911  NA 131* 131* 133*  K 2.4* 2.9* 3.7  CL 88* 91* 97*  CO2 16* 24 26  GLUCOSE 93 117* 99  BUN 15 16 13   CREATININE 0.92 0.84 0.75  CALCIUM 8.1* 7.8* 7.9*  MG 1.6* 2.1 2.0  PHOS  --  UNABLE TO REPORT DUE TO ICTERUS.PMF UNABLE TO REPORT DUE TO ICTERUS.PMF   GFR: Estimated Creatinine Clearance: 69.8 mL/min (by C-G formula based on SCr of 0.75 mg/dL). Liver Function Tests: Recent Labs  Lab 12/12/19 2134 12/13/19 0541 12/14/19 0911  AST 248* 222* 233*  ALT 75* 70* 73*  ALKPHOS 352* 333* 341*  BILITOT 16.7* 15.2* 14.9*  PROT 6.4* 5.7* 5.8*  ALBUMIN 2.5* 2.3* 2.2*   No results for input(s): LIPASE, AMYLASE in the last 168 hours. Recent Labs  Lab 12/12/19 2313 12/14/19 0911  AMMONIA 91* 41*   Coagulation Profile: Recent Labs  Lab 12/12/19 2134 12/13/19 0541 12/14/19 0911  INR 1.1 1.2 1.2   Cardiac Enzymes: No results for input(s): CKTOTAL, CKMB, CKMBINDEX, TROPONINI in the last 168 hours. BNP (last 3 results) No results for input(s): PROBNP in the last 8760 hours. HbA1C: No results for input(s): HGBA1C in the last 72 hours. CBG: No results for input(s): GLUCAP in the last 168 hours. Lipid Profile: No results for input(s): CHOL, HDL, LDLCALC, TRIG, CHOLHDL, LDLDIRECT in the last 72 hours. Thyroid Function Tests: No results for input(s): TSH, T4TOTAL, FREET4, T3FREE, THYROIDAB in the last 72 hours. Anemia Panel: Recent Labs     12/13/19 0541  VITAMINB12 3,149*  FOLATE 5.6*  FERRITIN 388*  TIBC 154*  IRON 96   Sepsis Labs: No results for input(s): PROCALCITON, LATICACIDVEN in the last 168 hours.  Recent Results (from the past 240 hour(s))  SARS Coronavirus 2 by RT PCR (hospital order, performed in [facility] hospital lab) Nasopharyngeal Nasopharyngeal Swab     Status: None   Collection Time: 12/13/19 12:58 AM   Specimen: Nasopharyngeal Swab  Result Value Ref Range Status   SARS Coronavirus 2 NEGATIVE NEGATIVE Final    Comment: (NOTE) SARS-CoV-2 target nucleic acids are NOT DETECTED.  The SARS-CoV-2 RNA is generally detectable in upper and lower respiratory specimens during the acute phase of infection. The lowest concentration of SARS-CoV-2 viral copies this assay can detect is 250 copies / mL. A negative result does not preclude SARS-CoV-2 infection and should not be used as the sole basis for treatment or other patient management decisions.  A negative result may occur with improper specimen collection / handling, submission of specimen other than nasopharyngeal swab, presence of viral mutation(s) within the areas targeted by this assay, and inadequate number of viral copies (<250 copies / mL). A negative result must be combined with clinical observations, patient history, and epidemiological information.  Fact Sheet for Patients:   StrictlyIdeas.no  Fact Sheet for Healthcare Providers: BankingDealers.co.za  This test is not yet approved or  cleared by the [place] FDA and has been authorized for detection and/or diagnosis of SARS-CoV-2 by FDA under an Emergency Use Authorization (EUA).  This EUA will remain in effect (meaning this test can be used) for the duration of the COVID-19 declaration under Section 564(b)(1) of the Act, 21 U.S.C. section 360bbb-3(b)(1), unless the authorization is terminated or revoked sooner.  Performed at [facility], [address], [place], [place] [address]          Radiology Studies: CT ABDOMEN PELVIS W CONTRAST  Result Date: 12/12/2019 CLINICAL DATA:  Elevated LFTs and bilirubin. Concern for new liver failure. EXAM: CT ABDOMEN AND PELVIS WITH CONTRAST TECHNIQUE: Multidetector CT imaging of the abdomen and pelvis was performed using the standard protocol following bolus administration of intravenous contrast. CONTRAST:  123mL OMNIPAQUE IOHEXOL 300 MG/ML  SOLN COMPARISON:  Abdominal ultrasound 07/22/2019.  CT 11/26/2018 FINDINGS: Lower chest: No focal airspace disease or pleural effusion. Heart is normal in size. There are coronary artery calcifications. Hepatobiliary: Enlarged liver spanning 24 cm cranial caudal. Progressive hepatomegaly from prior exam. Severely decreased hepatic parenchyma consistent with advanced steatosis. Minimal focal fatty sparing adjacent to the gallbladder fossa. No evidence of discrete focal lesion. No definite capsular nodularity. Minimal high-density gallbladder contents may represent sludge or small stones. There is no pericholecystic inflammation. No biliary dilatation. Recannulized umbilical vein. Pancreas: No ductal dilatation or inflammation. There is no evidence of pancreatic mass. Spleen: Normal in size without focal abnormality. Adrenals/Urinary Tract: Normal adrenal glands. Nonobstructing stones in the left kidney. No hydronephrosis. Mild cortical scarring in the lower right kidney. Urinary bladder is partially distended. No bladder wall thickening. Stomach/Bowel: Paraesophageal and proximal perigastric varices. Stomach is decompressed. 6 mm intramural lipoma in the third portion of the duodenum, nonobstructive in incidental. Moderate length segment of small bowel wall thickening and edema in the lower abdomen. No obstruction. Appendix not confidently visualized. Submucosal fatty infiltration involving the cecum and ascending colon. Small volume of colonic  stool. Diverticulosis in the descending and sigmoid colon without diverticulitis. Vascular/Lymphatic: Age advanced aortic atherosclerosis. No aortic aneurysm. Patent portal and splenic vein. Recannulated umbilical vein with collaterals coursing into the inferior epigastric cyst in the right lower abdomen. Few additional portosystemic collaterals in the abdomen. Enlarged periportal node measuring 11 mm, similar to prior and likely reactive. Reproductive: Uterus and bilateral adnexa are unremarkable. Other: Small volume abdominopelvic ascites.  No free air. Musculoskeletal: Degenerative disc disease at L5-S1. Suggestion of early avascular necrosis of the left femoral head without subchondral collapse. There are no acute or suspicious osseous abnormalities. IMPRESSION: 1. Hepatomegaly and severe hepatic steatosis. Progressive hepatomegaly from prior exam. No discrete focal lesion. 2. Portal hypertension with recanalized umbilical vein, paraesophageal and proximal perigastric varices, and small volume abdominopelvic ascites. 3. No biliary dilatation.  No pancreatic mass. 4. Moderate length segment of small bowel wall thickening and edema in the lower abdomen, consistent with enteritis, likely reactive in the setting with ascites. 5. Nonobstructing left renal stones. 6. Colonic diverticulosis without diverticulitis. 7. Aortic atherosclerosis is age  advanced. Aortic Atherosclerosis (ICD10-I70.0). Electronically Signed   By: Keith Rake M.D.   On: 12/12/2019 23:56   US Abdomen Limited  Result Date: 12/13/2019 CLINICAL DATA:  Acute liver failure, assess for ascites EXAM: LIMITED ABDOMEN ULTRASOUND FOR ASCITES TECHNIQUE: Limited ultrasound survey for ascites was performed in all four abdominal quadrants. COMPARISON:  12/12/2019 CT FINDINGS: Survey of the abdominal 4 quadrants demonstrates no significant volume of ascites that warrants paracentesis. Trace amount of perihepatic ascites. IMPRESSION: Trace  abdominopelvic ascites.  Paracentesis not performed. Electronically Signed   By: Jerilynn Mages.  Shick M.D.   On: 12/13/2019 11:55        Scheduled Meds: . enoxaparin (LOVENOX) injection  40 mg Subcutaneous Q24H  . folic acid  1 mg Oral Daily  . lactulose  30 g Oral TID  . LORazepam  0-4 mg Intravenous Q6H   Followed by  . [START ON 12/15/2019] LORazepam  0-4 mg Intravenous Q12H  . multivitamin with minerals  1 tablet Oral Daily  . potassium chloride  40 mEq Oral BID  . thiamine  100 mg Oral Daily   Or  . thiamine  100 mg Intravenous Daily   Continuous Infusions:    LOS: 1 day    Time spent: 30 minutes    Barb Merino, MD [facility] Pager [phone]

## 2019-12-14 NOTE — Progress Notes (Signed)
Katherine Darby, MD [address]  [place]  [place], [place] [address]  Main: [phone]  Fax: [phone] Pager: [phone]   Subjective: Patient is sitting up in chair.  She says she had shower this morning.  She is trying to eat.  She has been thirsty and drinking more water.  She reported having bowel movement, does not know if it was brown or black.  Her husband is bedside.  Has been afebrile   Objective: Vital signs in last 24 hours: Vitals:   12/14/19 0016 12/14/19 0021 12/14/19 0611 12/14/19 1224  BP: (!) 90/54 103/66 98/76 99/65   Pulse: 85 89 87 89  Resp: 16  18 16   Temp: 97.9 F (36.6 C)  98.1 F (36.7 C) 98.5 F (36.9 C)  TempSrc: Oral  Oral Oral  SpO2: 96%  93% 99%  Weight:      Height:       Weight change:   Intake/Output Summary (Last 24 hours) at 12/14/2019 1430 Last data filed at 12/14/2019 1010 Gross per 24 hour  Intake 600 ml  Output --  Net 600 ml     Exam: Heart:: Regular rate and rhythm, S1S2 present or without murmur or extra heart sounds Lungs: normal and clear to auscultation Abdomen: Soft, diffusely distended, nontender 3+ pitting edema in bilateral lower extremities   Lab Results: CBC Latest Ref Rng & Units 12/14/2019 12/13/2019 12/12/2019  WBC 4.0 - 10.5 K/uL 11.0(H) 13.4(H) 14.8(H)  Hemoglobin 12.0 - 15.0 g/dL 9.2(L) 9.1(L) 10.8(L)  Hematocrit 36 - 46 % 27.8(L) 26.4(L) 33.0(L)  Platelets 150 - 400 K/uL 147(L) 164 181   CMP Latest Ref Rng & Units 12/14/2019 12/13/2019 12/12/2019  Glucose 70 - 99 mg/dL 99 117(H) 93  BUN 6 - 20 mg/dL 13 16 15   Creatinine 0.44 - 1.00 mg/dL 0.75 0.84 0.92  Sodium 135 - 145 mmol/L 133(L) 131(L) 131(L)  Potassium 3.5 - 5.1 mmol/L 3.7 2.9(L) 2.4(LL)  Chloride 98 - 111 mmol/L 97(L) 91(L) 88(L)  CO2 22 - 32 mmol/L 26 24 16(L)  Calcium 8.9 - 10.3 mg/dL 7.9(L) 7.8(L) 8.1(L)  Total Protein 6.5 - 8.1 g/dL 5.8(L) 5.7(L) 6.4(L)  Total Bilirubin 0.3 - 1.2 mg/dL 14.9(H) 15.2(H) 16.7(H)  Alkaline Phos  38 - 126 U/L 341(H) 333(H) 352(H)  AST 15 - 41 U/L 233(H) 222(H) 248(H)  ALT 0 - 44 U/L 73(H) 70(H) 75(H)    Micro Results: Recent Results (from the past 240 hour(s))  SARS Coronavirus 2 by RT PCR (hospital order, performed in [facility] hospital lab) Nasopharyngeal Nasopharyngeal Swab     Status: None   Collection Time: 12/13/19 12:58 AM   Specimen: Nasopharyngeal Swab  Result Value Ref Range Status   SARS Coronavirus 2 NEGATIVE NEGATIVE Final    Comment: (NOTE) SARS-CoV-2 target nucleic acids are NOT DETECTED.  The SARS-CoV-2 RNA is generally detectable in upper and lower respiratory specimens during the acute phase of infection. The lowest concentration of SARS-CoV-2 viral copies this assay can detect is 250 copies / mL. A negative result does not preclude SARS-CoV-2 infection and should not be used as the sole basis for treatment or other patient management decisions.  A negative result may occur with improper specimen collection / handling, submission of specimen other than nasopharyngeal swab, presence of viral mutation(s) within the areas targeted by this assay, and inadequate number of viral copies (<250 copies / mL). A negative result must be combined with clinical observations, patient history, and epidemiological information.  Fact Sheet for Patients:   StrictlyIdeas.no  Fact Sheet for Healthcare Providers: BankingDealers.co.za  This test is not yet approved or  cleared by the [place] FDA and has been authorized for detection and/or diagnosis of SARS-CoV-2 by FDA under an Emergency Use Authorization (EUA).  This EUA will remain in effect (meaning this test can be used) for the duration of the COVID-19 declaration under Section 564(b)(1) of the Act, 21 U.S.C. section 360bbb-3(b)(1), unless the authorization is terminated or revoked sooner.  Performed at [facility], [address] [place], [place]  [address]    Studies/Results: CT ABDOMEN PELVIS W CONTRAST  Result Date: 12/12/2019 CLINICAL DATA:  Elevated LFTs and bilirubin. Concern for new liver failure. EXAM: CT ABDOMEN AND PELVIS WITH CONTRAST TECHNIQUE: Multidetector CT imaging of the abdomen and pelvis was performed using the standard protocol following bolus administration of intravenous contrast. CONTRAST:  18mL OMNIPAQUE IOHEXOL 300 MG/ML  SOLN COMPARISON:  Abdominal ultrasound 07/22/2019.  CT 11/26/2018 FINDINGS: Lower chest: No focal airspace disease or pleural effusion. Heart is normal in size. There are coronary artery calcifications. Hepatobiliary: Enlarged liver spanning 24 cm cranial caudal. Progressive hepatomegaly from prior exam. Severely decreased hepatic parenchyma consistent with advanced steatosis. Minimal focal fatty sparing adjacent to the gallbladder fossa. No evidence of discrete focal lesion. No definite capsular nodularity. Minimal high-density gallbladder contents may represent sludge or small stones. There is no pericholecystic inflammation. No biliary dilatation. Recannulized umbilical vein. Pancreas: No ductal dilatation or inflammation. There is no evidence of pancreatic mass. Spleen: Normal in size without focal abnormality. Adrenals/Urinary Tract: Normal adrenal glands. Nonobstructing stones in the left kidney. No hydronephrosis. Mild cortical scarring in the lower right kidney. Urinary bladder is partially distended. No bladder wall thickening. Stomach/Bowel: Paraesophageal and proximal perigastric varices. Stomach is decompressed. 6 mm intramural lipoma in the third portion of the duodenum, nonobstructive in incidental. Moderate length segment of small bowel wall thickening and edema in the lower abdomen. No obstruction. Appendix not confidently visualized. Submucosal fatty infiltration involving the cecum and ascending colon. Small volume of colonic stool. Diverticulosis in the descending and sigmoid colon without  diverticulitis. Vascular/Lymphatic: Age advanced aortic atherosclerosis. No aortic aneurysm. Patent portal and splenic vein. Recannulated umbilical vein with collaterals coursing into the inferior epigastric cyst in the right lower abdomen. Few additional portosystemic collaterals in the abdomen. Enlarged periportal node measuring 11 mm, similar to prior and likely reactive. Reproductive: Uterus and bilateral adnexa are unremarkable. Other: Small volume abdominopelvic ascites.  No free air. Musculoskeletal: Degenerative disc disease at L5-S1. Suggestion of early avascular necrosis of the left femoral head without subchondral collapse. There are no acute or suspicious osseous abnormalities. IMPRESSION: 1. Hepatomegaly and severe hepatic steatosis. Progressive hepatomegaly from prior exam. No discrete focal lesion. 2. Portal hypertension with recanalized umbilical vein, paraesophageal and proximal perigastric varices, and small volume abdominopelvic ascites. 3. No biliary dilatation.  No pancreatic mass. 4. Moderate length segment of small bowel wall thickening and edema in the lower abdomen, consistent with enteritis, likely reactive in the setting with ascites. 5. Nonobstructing left renal stones. 6. Colonic diverticulosis without diverticulitis. 7. Aortic atherosclerosis is age advanced. Aortic Atherosclerosis (ICD10-I70.0). Electronically Signed   By: Keith Rake M.D.   On: 12/12/2019 23:56   US Abdomen Limited  Result Date: 12/13/2019 CLINICAL DATA:  Acute liver failure, assess for ascites EXAM: LIMITED ABDOMEN ULTRASOUND FOR ASCITES TECHNIQUE: Limited ultrasound survey for ascites was performed in all four abdominal quadrants. COMPARISON:  12/12/2019 CT FINDINGS: Survey  of the abdominal 4 quadrants demonstrates no significant volume of ascites that warrants paracentesis. Trace amount of perihepatic ascites. IMPRESSION: Trace abdominopelvic ascites.  Paracentesis not performed. Electronically Signed    By: Jerilynn Mages.  Shick M.D.   On: 12/13/2019 11:55   Medications:  I have reviewed the patient's current medications. Prior to Admission:  Medications Prior to Admission  Medication Sig Dispense Refill Last Dose  . diphenhydrAMINE (BENADRYL) 25 MG tablet Take 25 mg by mouth every 6 (six) hours as needed for itching or allergies.    12/12/2019 at Unknown time  . ibuprofen (ADVIL,MOTRIN) 800 MG tablet Take 800 mg by mouth every 8 (eight) hours as needed.   Past Month at Unknown time  . albuterol (VENTOLIN HFA) 108 (90 Base) MCG/ACT inhaler Inhale 2 puffs into the lungs every 6 (six) hours as needed for wheezing or shortness of breath. (Patient not taking: Reported on 12/13/2019) 18 g 1 Not Taking at Unknown time  . hydrochlorothiazide (HYDRODIURIL) 25 MG tablet Take 1 tablet (25 mg total) by mouth daily. (Patient not taking: Reported on 12/13/2019) 30 tablet 0 Not Taking at Unknown time  . lamoTRIgine (LAMICTAL) 100 MG tablet Take 100 mg by mouth 2 (two) times daily. (Patient not taking: Reported on 12/13/2019)   Not Taking at Unknown time  . losartan (COZAAR) 50 MG tablet Take 1 tablet (50 mg total) by mouth daily. (Patient not taking: Reported on 12/13/2019) 90 tablet 0 Not Taking at Unknown time   Scheduled: . enoxaparin (LOVENOX) injection  40 mg Subcutaneous Q24H  . feeding supplement  1 Container Oral TID BM  . folic acid  1 mg Oral Daily  . LORazepam  0-4 mg Intravenous Q6H   Followed by  . [START ON 12/15/2019] LORazepam  0-4 mg Intravenous Q12H  . multivitamin with minerals  1 tablet Oral Daily  . potassium chloride  40 mEq Oral BID  . rifaximin  550 mg Oral BID  . spironolactone  50 mg Oral Daily  . thiamine  100 mg Oral Daily   Or  . thiamine  100 mg Intravenous Daily   Continuous:  WUJ:[id number], lactulose, LORazepam **OR** LORazepam Anti-infectives (From admission, onward)   Start     Dose/Rate Route Frequency Ordered Stop   12/14/19 1430  rifaximin (XIFAXAN) tablet 550 mg      Discontinue     550 mg Oral 2 times daily 12/14/19 1423       Scheduled Meds: . enoxaparin (LOVENOX) injection  40 mg Subcutaneous Q24H  . feeding supplement  1 Container Oral TID BM  . folic acid  1 mg Oral Daily  . LORazepam  0-4 mg Intravenous Q6H   Followed by  . [START ON 12/15/2019] LORazepam  0-4 mg Intravenous Q12H  . multivitamin with minerals  1 tablet Oral Daily  . potassium chloride  40 mEq Oral BID  . rifaximin  550 mg Oral BID  . spironolactone  50 mg Oral Daily  . thiamine  100 mg Oral Daily   Or  . thiamine  100 mg Intravenous Daily   Continuous Infusions: PRN Meds:.ibuprofen, lactulose, LORazepam **OR** LORazepam   Assessment: Principal Problem:   Acute hepatic encephalopathy Active Problems:   Bipolar disorder ([place])   Essential hypertension   Alcoholic ketoacidosis   Alcoholic steatohepatitis   Hypokalemia   Hypomagnesemia   Alcohol use disorder, moderate, dependence (HCC)   Macrocytic anemia   Liver failure, acute   Plan: Alcoholic hepatitis with decompensated alcoholic cirrhosis Child class  C, meld sodium 22 DF 29.9, would not meet criteria for steroids  Monitor LFTs daily Volume overload: Can start low-dose diuretics Lasix 20 mg and spironolactone 50 mg daily, closely monitor electrolytes and kidney function Trace ascites on ultrasound Start Xifaxan 550 mg twice daily Continue multivitamin plus thiamine plus folic acid Adequate nutrition and protein supplements daily Low-sodium, high-protein diet No evidence of coagulopathy Complete abstinence from alcohol use Inpatient psychiatry evaluation Close follow-up with GI/hepatology as outpatient  Dr. Vicente Males will cover from tomorrow   LOS: 1 day   Katherine Goodwin 12/14/2019, 2:30 PM

## 2019-12-14 NOTE — Evaluation (Signed)
Occupational Therapy Evaluation Patient Details Name: Katherine Goodwin MRN: [id number] DOB: 01-Jul-1960 Today's Date: 12/14/2019    History of Present Illness 59 year old female with history of hypertension, asthma, bipolar disorder, heavy alcohol use who drinks about 3-4 shots of liquor per day, last drink about 48 hours ago brought into the emergency room by her daughter because of concern of confusion.  Recently worsening memory, abdominal bloating and jaundiced.   Clinical Impression   Katherine Goodwin was seen for OT evaluation this date. Prior to hospital admission, pt was Independent c ADLs and mobility - however pt husband reports majority of time sleeping (suspect increased anhedonia r/t recent loss of family members). Pt lives with husband who works during day. Pt presents to acute OT demonstrating impaired ADL performance, functional cognition, and functional mobility 2/2 decreased safety awareness, functional strength/balance deficits, and decreased [place]. Pt currently requires MIN A self-feeding seated in chair. MOD I don/doff B socks in sitting - anticipate SBA + RW in standing. SBA simulated UBD in standing. MIN A bathing - assist for rear, back, and B feet. Pt would benefit from skilled OT to address noted impairments and functional limitations (see below for any additional details) in order to maximize safety and independence while minimizing falls risk and caregiver burden. Upon hospital discharge, recommend HHOT to maximize pt safety and return to functional independence during meaningful occupations of daily life.     Follow Up Recommendations  Home health OT    Equipment Recommendations  None recommended by OT    Recommendations for Other Services       Precautions / Restrictions Precautions Precautions: Fall;Other (comment) (Seizure) Restrictions Weight Bearing Restrictions: No      Mobility Bed Mobility Overal bed mobility: Modified Independent             General  bed mobility comments: Pt received and left up in chair   Transfers Overall transfer level: Needs assistance Equipment used: None Transfers: Sit to/from Stand Sit to Stand: Min guard         General transfer comment: Close SBA sit<>stand at [facility]     Balance Overall balance assessment: Needs assistance Sitting-balance support: No upper extremity supported;Feet supported Sitting balance-Leahy Scale: Good     Standing balance support: No upper extremity supported;During functional activity Standing balance-Leahy Scale: Good Standing balance comment: Per RN - pt requires assist for RW mngmt during mobliity 2/2 decreased balance                            ADL either performed or assessed with clinical judgement   ADL Overall ADL's : Needs assistance/impaired                                       General ADL Comments: MIN A self-feeding seated in chair. MOD I don/doff B socks in sitting - anticipate SBA + RW in standing. SBA simulated UBD in standing. MIN A bathing - assist for rear and BLE      Vision         Perception     Praxis      Pertinent Vitals/Pain Pain Assessment: No/denies pain     Hand Dominance Right   Extremity/Trunk Assessment Upper Extremity Assessment Upper Extremity Assessment: Overall WFL for tasks assessed   Lower Extremity Assessment Lower Extremity Assessment: Overall WFL for tasks assessed  Communication Communication Communication: No difficulties   Cognition Arousal/Alertness: Awake/alert Behavior During Therapy: Flat affect Overall Cognitive Status: Within Functional Limits for tasks assessed                                     General Comments  Skin appears jaundice     Exercises Exercises: Other exercises Other Exercises Other Exercises: Pt and caregiver educated re: OT role, DME recs, d/c recs, importance of mobility for funcitonal strengthening, energy conservations, falls  prevention Other Exercises: LBD, UBD, self-feeding, sitting/standing balance/tolerance    Shoulder Instructions      Home Living Family/patient expects to be discharged to:: Private residence Living Arrangements: Spouse/significant other Available Help at Discharge: Family;Available PRN/intermittently                                    Prior Functioning/Environment Level of Independence: Independent        Comments: Husband reports pt sleeps all day 2/2 depression however recently stopped taking her medication. Husband reports wife drinks ~3 bottles of alcohol/week but has recently slowed down the last 2 weeks.         OT Problem List: Decreased strength;Decreased activity tolerance;Impaired balance (sitting and/or standing);Decreased safety awareness;Decreased knowledge of use of DME or AE      OT Treatment/Interventions: Self-care/ADL training;Therapeutic exercise;Energy conservation;DME and/or AE instruction;Therapeutic activities;Cognitive remediation/compensation;Patient/family education;Balance training    OT Goals(Current goals can be found in the care plan section) Acute Rehab OT Goals Patient Stated Goal: To return home  OT Goal Formulation: With patient/family Time For Goal Achievement: 12/28/19 Potential to Achieve Goals: Good ADL Goals Pt Will Perform Lower Body Dressing: with modified independence;sit to/from stand (c LRAD PRN) Pt Will Transfer to Toilet: with supervision;ambulating;regular height toilet (c LRAD PRN) Additional ADL Goal #1: Pt will independently verbalize plan to implement x3 falls prevention strategies Additional ADL Goal #2: Pt will Independently verbalize x3 preferred leisure activities for improved engagement in meaningful occupations  OT Frequency: Min 1X/week   Barriers to D/C: Decreased caregiver support          Co-evaluation              AM-PAC OT "6 Clicks" Daily Activity     Outcome Measure Help from another  person eating meals?: A Little Help from another person taking care of personal grooming?: A Little Help from another person toileting, which includes using toliet, bedpan, or urinal?: A Little Help from another person bathing (including washing, rinsing, drying)?: A Little Help from another person to put on and taking off regular upper body clothing?: A Little Help from another person to put on and taking off regular lower body clothing?: A Little 6 Click Score: 18   End of Session Nurse Communication: Mobility status (Husband requesting assist for pt depression )  Activity Tolerance: Patient tolerated treatment well Patient left: in chair;with call bell/phone within reach;with family/visitor present  OT Visit Diagnosis: Unsteadiness on feet (R26.81);Other abnormalities of gait and mobility (R26.89)                Time: 3419-3790 OT Time Calculation (min): 12 min Charges:  OT General Charges $OT Visit: 1 Visit OT Evaluation $OT Eval Low Complexity: 1 Low OT Treatments $Self Care/Home Management : 8-22 mins  Dessie Coma, M.S. OTR/L  12/14/19, 3:45 PM

## 2019-12-14 NOTE — Progress Notes (Signed)
OT Cancellation Note  Patient Details Name: Katherine Goodwin MRN: [id number] DOB: Dec 21, 1960   Cancelled Treatment:    Reason Eval/Treat Not Completed: Patient declined, no reason specified. OT order received and chart reviewed. Upon arrival, PT found exiting pt room reporting pt requesting to be cleaned prior to participating in further activity. OT will follow up at later date/time as able.    Dessie Coma, M.S. OTR/L  12/14/19, 12:35 PM

## 2019-12-14 NOTE — Progress Notes (Signed)
Physical Therapy Evaluation Patient Details Name: Katherine Goodwin MRN: [id number] DOB: 08/01/1960 Today's Date: 12/14/2019   History of Present Illness  59 year old female with history of hypertension, asthma, bipolar disorder, heavy alcohol use who drinks about 3-4 shots of liquor per day, last drink about 48 hours ago brought into the emergency room by her daughter because of concern of confusion.  Recently worsening memory, abdominal bloating and jaundiced.  Clinical Impression  Patient agrees to PT eval but during the eval she keeps shutting her eyes and is very flat. She is alert and orientated to person, place and time.  Her BLE strength is 3/5. She performs bed mobility with min assist supine <> sit . Her sitting and standing balance is good with initial static standing balance good. She transfers sit to stand with min assist. She finds that her gown and body is soiled and evaluation is ended so that she can get cleaned up. Further testing of gait will be performed as able at next treatment. Patient will benefit from skilled PT to improve mobility and safety and strength.     Follow Up Recommendations Home health PT    Equipment Recommendations  None recommended by PT    Recommendations for Other Services       Precautions / Restrictions Restrictions Weight Bearing Restrictions: No      Mobility  Bed Mobility Overal bed mobility: Modified Independent                Transfers Overall transfer level: Needs assistance Equipment used: None Transfers: Sit to/from Stand Sit to Stand: Min assist         General transfer comment:  (initial static standing steady)  Ambulation/Gait Ambulation/Gait assistance:  (unable to test due to patient and gown soiled )              Stairs            Wheelchair Mobility    Modified [place] (Stroke Patients Only)       Balance Overall balance assessment: Independent                                            Pertinent Vitals/Pain Pain Assessment: No/denies pain    Home Living Family/patient expects to be discharged to:: Private residence Living Arrangements: Spouse/significant other Available Help at Discharge: Family                  Prior Function Level of Independence: Independent               Hand Dominance   Dominant Hand: Right    Extremity/Trunk Assessment   Upper Extremity Assessment Upper Extremity Assessment: Overall WFL for tasks assessed    Lower Extremity Assessment Lower Extremity Assessment: Overall WFL for tasks assessed       Communication   Communication: No difficulties  Cognition Arousal/Alertness: Lethargic Behavior During Therapy: Flat affect Overall Cognitive Status: Within Functional Limits for tasks assessed                                        General Comments      Exercises     Assessment/Plan    PT Assessment Patient needs continued PT services  PT Problem List Decreased strength;Decreased mobility  PT Treatment Interventions Gait training;Therapeutic activities;Therapeutic exercise    PT Goals (Current goals can be found in the Care Plan section)  Acute Rehab PT Goals Patient Stated Goal: no goals stated PT Goal Formulation: Patient unable to participate in goal setting Time For Goal Achievement: 12/28/19 Potential to Achieve Goals: Good    Frequency Min 2X/week   Barriers to discharge        Co-evaluation               AM-PAC PT "6 Clicks" Mobility  Outcome Measure Help needed turning from your back to your side while in a flat bed without using bedrails?: None Help needed moving from lying on your back to sitting on the side of a flat bed without using bedrails?: A Little Help needed moving to and from a bed to a chair (including a wheelchair)?: A Little Help needed standing up from a chair using your arms (e.g., wheelchair or bedside chair)?: A Little Help  needed to walk in hospital room?: A Little Help needed climbing 3-5 steps with a railing? : A Little 6 Click Score: 19    End of Session Equipment Utilized During Treatment: Gait belt Activity Tolerance: Patient limited by lethargy Patient left: in bed;with bed alarm set;with family/visitor present Nurse Communication: Mobility status;Other (comment) (nsg told about patient needed a bath) PT Visit Diagnosis: Unsteadiness on feet (R26.81);Muscle weakness (generalized) (M62.81);Difficulty in walking, not elsewhere classified (R26.2)    Time: 0867-6195 PT Time Calculation (min) (ACUTE ONLY): 14 min   Charges:   PT Evaluation $PT Eval Low Complexity: 1 Low           Alanson Puls, PT DPT 12/14/2019, 12:49 PM

## 2019-12-15 LAB — CMV DNA, QUANTITATIVE, PCR
CMV DNA Quant: NEGATIVE IU/mL
Log10 CMV Qn DNA Pl: UNDETERMINED log10 IU/mL

## 2019-12-15 LAB — COMPREHENSIVE METABOLIC PANEL
ALT: 65 U/L — ABNORMAL HIGH (ref 0–44)
AST: 203 U/L — ABNORMAL HIGH (ref 15–41)
Albumin: 2.1 g/dL — ABNORMAL LOW (ref 3.5–5.0)
Alkaline Phosphatase: 308 U/L — ABNORMAL HIGH (ref 38–126)
Anion gap: 9 (ref 5–15)
BUN: 13 mg/dL (ref 6–20)
CO2: 24 mmol/L (ref 22–32)
Calcium: 7.9 mg/dL — ABNORMAL LOW (ref 8.9–10.3)
Chloride: 98 mmol/L (ref 98–111)
Creatinine, Ser: 0.75 mg/dL (ref 0.44–1.00)
GFR calc Af Amer: >60 mL/min (ref >60)
GFR calc non Af Amer: >60 mL/min (ref >60)
Glucose, Bld: 91 mg/dL (ref 70–99)
Potassium: 4.5 mmol/L (ref 3.5–5.1)
Sodium: 131 mmol/L — ABNORMAL LOW (ref 135–145)
Total Bilirubin: 14.4 mg/dL — ABNORMAL HIGH (ref 0.3–1.2)
Total Protein: 5.3 g/dL — ABNORMAL LOW (ref 6.5–8.1)

## 2019-12-15 LAB — AMMONIA: Ammonia: 50 umol/L — ABNORMAL HIGH (ref 9–35)

## 2019-12-15 LAB — VARICELLA-ZOSTER BY PCR: Varicella-Zoster, PCR: NEGATIVE

## 2019-12-15 LAB — HEPATITIS C VRS RNA DETECT BY PCR-QUAL: Hepatitis C Vrs RNA by PCR-Qual: NEGATIVE

## 2019-12-15 MED ORDER — RIFAXIMIN 550 MG PO TABS: 550.0000 mg | ORAL_TABLET | Freq: Two times a day (BID) | ORAL | 0 refills | Status: DC

## 2019-12-15 MED ORDER — LACTULOSE 10 GM/15ML PO SOLN: 10.0000 g | Freq: Three times a day (TID) | ORAL | 3 refills | Status: DC | PRN

## 2019-12-15 MED ORDER — SPIRONOLACTONE 50 MG PO TABS: 50.0000 mg | ORAL_TABLET | Freq: Every day | ORAL | 0 refills | Status: DC

## 2019-12-15 MED ORDER — ADULT MULTIVITAMIN W/MINERALS CH: 1.0000 | ORAL_TABLET | Freq: Every day | ORAL | Status: DC

## 2019-12-15 NOTE — Progress Notes (Signed)
Patient is stable and ready to discharge home. Patient's IV removed without issues. Patient's belongings packed and given to patient's husband present at bedside. Writer went over discharge paperwork with patient and husband and both verbalized understanding and had no questions. Patient transported to care via [facility] by NT.

## 2019-12-15 NOTE — Progress Notes (Signed)
Katherine Goodwin , MD [address] [place], [place], [place], [address] [address], [place], [place], [place], [address] Phone: [phone]  Fax: [phone]   Katherine Goodwin is being followed for alcoholic hepatitis  Day 2 of follow up   Subjective: Doing well no complaints   Objective: Vital signs in last 24 hours: Vitals:   12/14/19 1224 12/14/19 1846 12/15/19 0007 12/15/19 0614  BP: 99/65 99/64 102/63 (!) 87/61  Pulse: 89 91 91 87  Resp: 16 16 17 18   Temp: 98.5 F (36.9 C) 97.9 F (36.6 C) 97.7 F (36.5 C) 97.8 F (36.6 C)  TempSrc: Oral Oral Oral Oral  SpO2: 99% 98% 91% 96%  Weight:      Height:       Weight change:   Intake/Output Summary (Last 24 hours) at 12/15/2019 9767 Last data filed at 12/14/2019 1010 Gross per 24 hour  Intake 120 ml  Output --  Net 120 ml     Exam: Heart:: Regular rate and rhythm, S1S2 present or without murmur or extra heart sounds Lungs: normal, clear to auscultation and clear to auscultation and percussion Abdomen: soft, nontender, normal bowel sounds   Lab Results: @LABTEST2 @ Micro Results: Recent Results (from the past 240 hour(s))  SARS Coronavirus 2 by RT PCR (hospital order, performed in [place] hospital lab) Nasopharyngeal Nasopharyngeal Swab     Status: None   Collection Time: 12/13/19 12:58 AM   Specimen: Nasopharyngeal Swab  Result Value Ref Range Status   SARS Coronavirus 2 NEGATIVE NEGATIVE Final    Comment: (NOTE) SARS-CoV-2 target nucleic acids are NOT DETECTED.  The SARS-CoV-2 RNA is generally detectable in upper and lower respiratory specimens during the acute phase of infection. The lowest concentration of SARS-CoV-2 viral copies this assay can detect is 250 copies / mL. A negative result does not preclude SARS-CoV-2 infection and should not be used as the sole basis for treatment or other patient management decisions.  A negative result may occur with improper specimen collection / handling,  submission of specimen other than nasopharyngeal swab, presence of viral mutation(s) within the areas targeted by this assay, and inadequate number of viral copies (<250 copies / mL). A negative result must be combined with clinical observations, patient history, and epidemiological information.  Fact Sheet for Patients:   StrictlyIdeas.no  Fact Sheet for Healthcare Providers: BankingDealers.co.za  This test is not yet approved or  cleared by the [place] FDA and has been authorized for detection and/or diagnosis of SARS-CoV-2 by FDA under an Emergency Use Authorization (EUA).  This EUA will remain in effect (meaning this test can be used) for the duration of the COVID-19 declaration under Section 564(b)(1) of the Act, 21 U.S.C. section 360bbb-3(b)(1), unless the authorization is terminated or revoked sooner.  Performed at [facility], [address] [place], [place] [address]    Studies/Results: US Abdomen Limited  Result Date: 12/13/2019 CLINICAL DATA:  Acute liver failure, assess for ascites EXAM: LIMITED ABDOMEN ULTRASOUND FOR ASCITES TECHNIQUE: Limited ultrasound survey for ascites was performed in all four abdominal quadrants. COMPARISON:  12/12/2019 CT FINDINGS: Survey of the abdominal 4 quadrants demonstrates no significant volume of ascites that warrants paracentesis. Trace amount of perihepatic ascites. IMPRESSION: Trace abdominopelvic ascites.  Paracentesis not performed. Electronically Signed   By: Jerilynn Mages.  Shick M.D.   On: 12/13/2019 11:55   Medications: I have reviewed the patient's current medications. Scheduled Meds: . enoxaparin (LOVENOX) injection  40 mg Subcutaneous Q24H  .  feeding supplement  1 Container Oral TID BM  . folic acid  1 mg Oral Daily  . LORazepam  0-4 mg Intravenous Q12H  . multivitamin with minerals  1 tablet Oral Daily  . rifaximin  550 mg Oral BID  . spironolactone  50 mg Oral Daily  .  thiamine  100 mg Oral Daily   Or  . thiamine  100 mg Intravenous Daily   Continuous Infusions: PRN Meds:.ibuprofen, lactulose, LORazepam **OR** LORazepam   Assessment: Principal Problem:   Acute hepatic encephalopathy Active Problems:   Bipolar disorder ([place])   Essential hypertension   Alcoholic ketoacidosis   Alcoholic steatohepatitis   Hypokalemia   Hypomagnesemia   Alcohol use disorder, moderate, dependence (HCC)   Macrocytic anemia   Liver failure, acute  MARELIN TAT is a 59 y.o. y/o female with a history of alcohol abuse admitted with confusion , elevated LFT's and found to have features of portal hypertension on imaging , also has enteritis. ?H/o long term NSAID's, has possible alcoholic hepatitis, ascites, hepatic encephelopathy.   DF 29.9 and would not meet criteria for steroids.   Plan 1.  Abnormal LFTs: Very likely secondary to alcohol F/u labs for acute viral hepatitis that are in process.  2.  Strongly to stop drinking alcohol.  Suggested to obtain help from alcoholic Anonymous 3.  As an outpatient will require EGD to screen for esophageal varices due to decompensation. 4.  continue lactulose and xifaxan .  Avoid diarrhea as dehydration and hypokalemia along with low other electrolytes can worsen hepatic encephalopathy. 5.  Urine analysisas part of infection screen to evaluate for confusion/encephelopathy  6. Low sodium diet with high protein- obtain dietary consult 7. Psychiatry consult for depression    LOS: 2 days   Katherine Bellows, MD 12/15/2019, 9:14 AM

## 2019-12-15 NOTE — Discharge Summary (Signed)
Physician Discharge Summary  Katherine Goodwin KCL:[id number] DOB: [birth year] DOA: 12/12/2019  PCP: Glean Hess, MD  Admit date: 12/12/2019 Discharge date: 12/15/2019  Admitted From: Home Disposition: Home with home health PT OT  Recommendations for Outpatient Follow-up:  1. Follow up with PCP in 1-2 weeks 2. Please obtain CMP, magnesium and phosphorus andCBC in one week 3. We will send referral to gastroenterology and psychiatry for outpatient referral  Home Health: PT/OT Equipment/Devices: None  Discharge Condition: Fair CODE STATUS: Full code Diet recommendation: Regular diet.  Nutrition supplements.  Discharge summary: 59 year old female with history of hypertension, asthma, bipolar disorder, heavy alcohol use who drinks about 3-4 shots of liquor per day, last drink about 48 hours ago brought into the emergency room by her daughter because of concern of confusion.  Recently worsening memory, abdominal bloating and jaundiced. In the emergency room, patient was reportedly confused.  Vital signs within normal limits.  Bilirubin 16.7.  Ammonia 91.  Potassium 2.4.  INR normal.  CT scan abdomen pelvis showed hepatomegaly, severe hepatic steatosis portal hypertension and small volume ascites.  Patient was given magnesium potassium lactulose and admitted to the hospital.  # Acute metabolic encephalopathy, acute hepatic encephalopathy:  Suspect alcoholic chronic liver disease with acute decompensation. Currently hemodynamically stable. Mental status is improving. Ammonia is 91- 41-50 on lactulose with good response and improving mentation.  Continue lactulose with aim of having more than 2-3 loose bowel movements a day. She has minimal ascites fluid, not amenable to paracentesis.  Seen by GI.  Recommended supportive management. Hepatitis panels are negative.  Electrolytes were aggressively replaced with improvement and normalization. With adequate clinical improvement going home with the  following plan, - Lactulose 10 g 3 times a day as needed with the aim to make 2-3 loose bowel movements a day, instructions explained to patient and her husband. -Started on Aldactone, 50 mg daily -Presented with severe hypokalemia and blood pressures less than 100, will not add any Lasix at this time. -Rifaximin twice a day. -GI to schedule follow-up as outpatient. -Total alcohol abstinence, patient agreeable. -She will also benefit with outpatient follow-up with behavioral health/mental health, a referral was sent. She did not show any evidence of alcohol withdrawal while in the hospital.  Work with PT OT.  She was found with deconditioning and they recommended she would benefit with home health PT OT.  Home health PT OT prescribed.     Discharge Diagnoses:  Principal Problem:   Acute hepatic encephalopathy Active Problems:   Bipolar disorder ([place])   Essential hypertension   Alcoholic ketoacidosis   Alcoholic steatohepatitis   Hypokalemia   Hypomagnesemia   Alcohol use disorder, moderate, dependence (HCC)   Macrocytic anemia   Liver failure, acute    Discharge Instructions  Discharge Instructions    Ambulatory referral to Gastroenterology   Complete by: As directed    Call MD for:  persistant dizziness or light-headedness   Complete by: As directed    Call MD for:  persistant nausea and vomiting   Complete by: As directed    Diet - low sodium heart healthy   Complete by: As directed    Discharge instructions   Complete by: As directed    No alcohol.   Increase activity slowly   Complete by: As directed      Allergies as of 12/15/2019      Reactions   Prozac [fluoxetine Hcl] Other (See Comments)   Homicidal and suicidal thoughts   Seroquel [quetiapine  Fumarate] Other (See Comments)   Homicidal and suicidal thoughts   Sulfa Antibiotics Nausea And Vomiting      Medication List    STOP taking these medications   albuterol 108 (90 Base) MCG/ACT  inhaler Commonly known as: VENTOLIN HFA   hydrochlorothiazide 25 MG tablet Commonly known as: HYDRODIURIL   lamoTRIgine 100 MG tablet Commonly known as: LAMICTAL   losartan 50 MG tablet Commonly known as: COZAAR     TAKE these medications   diphenhydrAMINE 25 MG tablet Commonly known as: BENADRYL Take 25 mg by mouth every 6 (six) hours as needed for itching or allergies.   ibuprofen 800 MG tablet Commonly known as: ADVIL Take 800 mg by mouth every 8 (eight) hours as needed.   lactulose 10 GM/15ML solution Commonly known as: CHRONULAC Take 15 mLs (10 g total) by mouth 3 (three) times daily as needed (to have 2-3 loose BM a day).   multivitamin with minerals Tabs tablet Take 1 tablet by mouth daily. Start taking on: December 16, 2019   rifaximin 550 MG Tabs tablet Commonly known as: XIFAXAN Take 1 tablet (550 mg total) by mouth 2 (two) times daily.   spironolactone 50 MG tablet Commonly known as: ALDACTONE Take 1 tablet (50 mg total) by mouth daily. Start taking on: December 16, 2019       Allergies  Allergen Reactions  . Prozac [Fluoxetine Hcl] Other (See Comments)    Homicidal and suicidal thoughts  . Seroquel [Quetiapine Fumarate] Other (See Comments)    Homicidal and suicidal thoughts  . Sulfa Antibiotics Nausea And Vomiting    Consultations:  Gastroenterology   Procedures/Studies: CT ABDOMEN PELVIS W CONTRAST  Result Date: 12/12/2019 CLINICAL DATA:  Elevated LFTs and bilirubin. Concern for new liver failure. EXAM: CT ABDOMEN AND PELVIS WITH CONTRAST TECHNIQUE: Multidetector CT imaging of the abdomen and pelvis was performed using the standard protocol following bolus administration of intravenous contrast. CONTRAST:  148mL OMNIPAQUE IOHEXOL 300 MG/ML  SOLN COMPARISON:  Abdominal ultrasound 07/22/2019.  CT 11/26/2018 FINDINGS: Lower chest: No focal airspace disease or pleural effusion. Heart is normal in size. There are coronary artery calcifications.  Hepatobiliary: Enlarged liver spanning 24 cm cranial caudal. Progressive hepatomegaly from prior exam. Severely decreased hepatic parenchyma consistent with advanced steatosis. Minimal focal fatty sparing adjacent to the gallbladder fossa. No evidence of discrete focal lesion. No definite capsular nodularity. Minimal high-density gallbladder contents may represent sludge or small stones. There is no pericholecystic inflammation. No biliary dilatation. Recannulized umbilical vein. Pancreas: No ductal dilatation or inflammation. There is no evidence of pancreatic mass. Spleen: Normal in size without focal abnormality. Adrenals/Urinary Tract: Normal adrenal glands. Nonobstructing stones in the left kidney. No hydronephrosis. Mild cortical scarring in the lower right kidney. Urinary bladder is partially distended. No bladder wall thickening. Stomach/Bowel: Paraesophageal and proximal perigastric varices. Stomach is decompressed. 6 mm intramural lipoma in the third portion of the duodenum, nonobstructive in incidental. Moderate length segment of small bowel wall thickening and edema in the lower abdomen. No obstruction. Appendix not confidently visualized. Submucosal fatty infiltration involving the cecum and ascending colon. Small volume of colonic stool. Diverticulosis in the descending and sigmoid colon without diverticulitis. Vascular/Lymphatic: Age advanced aortic atherosclerosis. No aortic aneurysm. Patent portal and splenic vein. Recannulated umbilical vein with collaterals coursing into the inferior epigastric cyst in the right lower abdomen. Few additional portosystemic collaterals in the abdomen. Enlarged periportal node measuring 11 mm, similar to prior and likely reactive. Reproductive: Uterus and bilateral adnexa are  unremarkable. Other: Small volume abdominopelvic ascites.  No free air. Musculoskeletal: Degenerative disc disease at L5-S1. Suggestion of early avascular necrosis of the left femoral head  without subchondral collapse. There are no acute or suspicious osseous abnormalities. IMPRESSION: 1. Hepatomegaly and severe hepatic steatosis. Progressive hepatomegaly from prior exam. No discrete focal lesion. 2. Portal hypertension with recanalized umbilical vein, paraesophageal and proximal perigastric varices, and small volume abdominopelvic ascites. 3. No biliary dilatation.  No pancreatic mass. 4. Moderate length segment of small bowel wall thickening and edema in the lower abdomen, consistent with enteritis, likely reactive in the setting with ascites. 5. Nonobstructing left renal stones. 6. Colonic diverticulosis without diverticulitis. 7. Aortic atherosclerosis is age advanced. Aortic Atherosclerosis (ICD10-I70.0). Electronically Signed   By: Keith Rake M.D.   On: 12/12/2019 23:56   US Abdomen Limited  Result Date: 12/13/2019 CLINICAL DATA:  Acute liver failure, assess for ascites EXAM: LIMITED ABDOMEN ULTRASOUND FOR ASCITES TECHNIQUE: Limited ultrasound survey for ascites was performed in all four abdominal quadrants. COMPARISON:  12/12/2019 CT FINDINGS: Survey of the abdominal 4 quadrants demonstrates no significant volume of ascites that warrants paracentesis. Trace amount of perihepatic ascites. IMPRESSION: Trace abdominopelvic ascites.  Paracentesis not performed. Electronically Signed   By: Jerilynn Mages.  Shick M.D.   On: 12/13/2019 11:55    (Echo, Carotid, EGD, Colonoscopy, ERCP)    Subjective: Patient was seen and examined.  No overnight events.  She had 3 bowel movements last night.  Denies any confusion.  She is determined to quit drinking alcohol.   Discharge Exam: Vitals:   12/15/19 0007 12/15/19 0614  BP: 102/63 (!) 87/61  Pulse: 91 87  Resp: 17 18  Temp: 97.7 F (36.5 C) 97.8 F (36.6 C)  SpO2: 91% 96%   Vitals:   12/14/19 1224 12/14/19 1846 12/15/19 0007 12/15/19 0614  BP: 99/65 99/64 102/63 (!) 87/61  Pulse: 89 91 91 87  Resp: 16 16 17 18   Temp: 98.5 F (36.9 C)  97.9 F (36.6 C) 97.7 F (36.5 C) 97.8 F (36.6 C)  TempSrc: Oral Oral Oral Oral  SpO2: 99% 98% 91% 96%  Weight:      Height:        General: Pt is alert, awake, not in acute distress, chronically sick looking and jaundiced. Cardiovascular: RRR, S1/S2 +, no rubs, no gallops Respiratory: CTA bilaterally, no wheezing, no rhonchi Abdominal: Soft, NT, ND, bowel sounds +, palpable firm nontender liver right lower quadrant. Extremities: no edema, no cyanosis    The results of significant diagnostics from this hospitalization (including imaging, microbiology, ancillary and laboratory) are listed below for reference.     Microbiology: Recent Results (from the past 240 hour(s))  SARS Coronavirus 2 by RT PCR (hospital order, performed in [facility] hospital lab) Nasopharyngeal Nasopharyngeal Swab     Status: None   Collection Time: 12/13/19 12:58 AM   Specimen: Nasopharyngeal Swab  Result Value Ref Range Status   SARS Coronavirus 2 NEGATIVE NEGATIVE Final    Comment: (NOTE) SARS-CoV-2 target nucleic acids are NOT DETECTED.  The SARS-CoV-2 RNA is generally detectable in upper and lower respiratory specimens during the acute phase of infection. The lowest concentration of SARS-CoV-2 viral copies this assay can detect is 250 copies / mL. A negative result does not preclude SARS-CoV-2 infection and should not be used as the sole basis for treatment or other patient management decisions.  A negative result may occur with improper specimen collection / handling, submission of specimen other than nasopharyngeal swab,  presence of viral mutation(s) within the areas targeted by this assay, and inadequate number of viral copies (<250 copies / mL). A negative result must be combined with clinical observations, patient history, and epidemiological information.  Fact Sheet for Patients:   StrictlyIdeas.no  Fact Sheet for Healthcare  Providers: BankingDealers.co.za  This test is not yet approved or  cleared by the [place] FDA and has been authorized for detection and/or diagnosis of SARS-CoV-2 by FDA under an Emergency Use Authorization (EUA).  This EUA will remain in effect (meaning this test can be used) for the duration of the COVID-19 declaration under Section 564(b)(1) of the Act, 21 U.S.C. section 360bbb-3(b)(1), unless the authorization is terminated or revoked sooner.  Performed at [facility], [address] [place], [place] [address]      Labs: BNP (last 3 results) No results for input(s): BNP in the last 8760 hours. Basic Metabolic Panel: Recent Labs  Lab 12/12/19 2134 12/13/19 0541 12/14/19 0911 12/15/19 0647  NA 131* 131* 133* 131*  K 2.4* 2.9* 3.7 4.5  CL 88* 91* 97* 98  CO2 16* 24 26 24   GLUCOSE 93 117* 99 91  BUN 15 16 13 13   CREATININE 0.92 0.84 0.75 0.75  CALCIUM 8.1* 7.8* 7.9* 7.9*  MG 1.6* 2.1 2.0  --   PHOS  --  UNABLE TO REPORT DUE TO ICTERUS.PMF UNABLE TO REPORT DUE TO ICTERUS.PMF  --    Liver Function Tests: Recent Labs  Lab 12/12/19 2134 12/13/19 0541 12/14/19 0911 12/15/19 0647  AST 248* 222* 233* 203*  ALT 75* 70* 73* 65*  ALKPHOS 352* 333* 341* 308*  BILITOT 16.7* 15.2* 14.9* 14.4*  PROT 6.4* 5.7* 5.8* 5.3*  ALBUMIN 2.5* 2.3* 2.2* 2.1*   No results for input(s): LIPASE, AMYLASE in the last 168 hours. Recent Labs  Lab 12/12/19 2313 12/14/19 0911 12/15/19 0647  AMMONIA 91* 41* 50*   CBC: Recent Labs  Lab 12/12/19 2134 12/13/19 0541 12/14/19 0911  WBC 14.8* 13.4* 11.0*  NEUTROABS  --   --  8.3*  HGB 10.8* 9.1* 9.2*  HCT 33.0* 26.4* 27.8*  MCV 118.3* 110.9* 116.8*  PLT 181 164 147*   Cardiac Enzymes: No results for input(s): CKTOTAL, CKMB, CKMBINDEX, TROPONINI in the last 168 hours. BNP: Invalid input(s): POCBNP CBG: No results for input(s): GLUCAP in the last 168 hours. D-Dimer No results for input(s):  DDIMER in the last 72 hours. Hgb A1c No results for input(s): HGBA1C in the last 72 hours. Lipid Profile No results for input(s): CHOL, HDL, LDLCALC, TRIG, CHOLHDL, LDLDIRECT in the last 72 hours. Thyroid function studies No results for input(s): TSH, T4TOTAL, T3FREE, THYROIDAB in the last 72 hours.  Invalid input(s): FREET3 Anemia work up Recent Labs    12/13/19 0541  VITAMINB12 3,149*  FOLATE 5.6*  FERRITIN 388*  TIBC 154*  IRON 96   Urinalysis    Component Value Date/Time   BILIRUBINUR neg 07/17/2019 0934   PROTEINUR Positive (A) 07/17/2019 0934   UROBILINOGEN 0.2 07/17/2019 0934   NITRITE neg 07/17/2019 0934   LEUKOCYTESUR Negative 07/17/2019 0934   Sepsis Labs Invalid input(s): PROCALCITONIN,  WBC,  LACTICIDVEN Microbiology Recent Results (from the past 240 hour(s))  SARS Coronavirus 2 by RT PCR (hospital order, performed in [place] hospital lab) Nasopharyngeal Nasopharyngeal Swab     Status: None   Collection Time: 12/13/19 12:58 AM   Specimen: Nasopharyngeal Swab  Result Value Ref Range Status   SARS Coronavirus 2 NEGATIVE NEGATIVE  Final    Comment: (NOTE) SARS-CoV-2 target nucleic acids are NOT DETECTED.  The SARS-CoV-2 RNA is generally detectable in upper and lower respiratory specimens during the acute phase of infection. The lowest concentration of SARS-CoV-2 viral copies this assay can detect is 250 copies / mL. A negative result does not preclude SARS-CoV-2 infection and should not be used as the sole basis for treatment or other patient management decisions.  A negative result may occur with improper specimen collection / handling, submission of specimen other than nasopharyngeal swab, presence of viral mutation(s) within the areas targeted by this assay, and inadequate number of viral copies (<250 copies / mL). A negative result must be combined with clinical observations, patient history, and epidemiological information.  Fact Sheet for  Patients:   StrictlyIdeas.no  Fact Sheet for Healthcare Providers: BankingDealers.co.za  This test is not yet approved or  cleared by the [place] FDA and has been authorized for detection and/or diagnosis of SARS-CoV-2 by FDA under an Emergency Use Authorization (EUA).  This EUA will remain in effect (meaning this test can be used) for the duration of the COVID-19 declaration under Section 564(b)(1) of the Act, 21 U.S.C. section 360bbb-3(b)(1), unless the authorization is terminated or revoked sooner.  Performed at [facility], [address] [place],  [address]      Time coordinating discharge:  40 minutes  SIGNED:   Barb Merino, MD  [facility] 12/15/2019, 10:50 AM

## 2019-12-16 ENCOUNTER — Encounter: Payer: Self-pay | Admitting: Gastroenterology

## 2019-12-16 LAB — HSV DNA BY PCR (REFERENCE LAB)
HSV 1 DNA: NEGATIVE
HSV 2 DNA: NEGATIVE

## 2019-12-16 LAB — HEPATITIS B E ANTIBODY: Hep B E Ab: NEGATIVE

## 2019-12-18 LAB — EPSTEIN BARR VRS(EBV DNA BY PCR)
EBV DNA QN by PCR: NEGATIVE copies/mL
log10 EBV DNA Qn PCR: UNDETERMINED log10 copy/mL

## 2019-12-25 ENCOUNTER — Other Ambulatory Visit: Payer: Self-pay

## 2019-12-25 ENCOUNTER — Inpatient Hospital Stay
Admission: EM | Admit: 2019-12-25 | Discharge: 2019-12-31 | DRG: 432 | Disposition: A | Discharge status: Home Health | Payer: Medicaid Other | Attending: Internal Medicine | Admitting: Internal Medicine

## 2019-12-25 ENCOUNTER — Emergency Department: Payer: Medicaid Other

## 2019-12-25 DIAGNOSIS — Z789 Other specified health status: Secondary | ICD-10-CM

## 2019-12-25 DIAGNOSIS — I10 Essential (primary) hypertension: Secondary | ICD-10-CM | POA: Diagnosis present

## 2019-12-25 DIAGNOSIS — J45909 Unspecified asthma, uncomplicated: Secondary | ICD-10-CM | POA: Diagnosis present

## 2019-12-25 DIAGNOSIS — K8020 Calculus of gallbladder without cholecystitis without obstruction: Secondary | ICD-10-CM | POA: Diagnosis present

## 2019-12-25 DIAGNOSIS — K7031 Alcoholic cirrhosis of liver with ascites: Secondary | ICD-10-CM | POA: Diagnosis present

## 2019-12-25 DIAGNOSIS — R1084 Generalized abdominal pain: Secondary | ICD-10-CM

## 2019-12-25 DIAGNOSIS — D72829 Elevated white blood cell count, unspecified: Secondary | ICD-10-CM | POA: Diagnosis present

## 2019-12-25 DIAGNOSIS — Z515 Encounter for palliative care: Secondary | ICD-10-CM

## 2019-12-25 DIAGNOSIS — F319 Bipolar disorder, unspecified: Secondary | ICD-10-CM | POA: Diagnosis present

## 2019-12-25 DIAGNOSIS — R109 Unspecified abdominal pain: Secondary | ICD-10-CM

## 2019-12-25 DIAGNOSIS — K7010 Alcoholic hepatitis without ascites: Secondary | ICD-10-CM | POA: Diagnosis present

## 2019-12-25 DIAGNOSIS — F1020 Alcohol dependence, uncomplicated: Secondary | ICD-10-CM | POA: Diagnosis present

## 2019-12-25 DIAGNOSIS — K219 Gastro-esophageal reflux disease without esophagitis: Secondary | ICD-10-CM | POA: Diagnosis present

## 2019-12-25 DIAGNOSIS — Z20822 Contact with and (suspected) exposure to covid-19: Secondary | ICD-10-CM | POA: Diagnosis present

## 2019-12-25 DIAGNOSIS — K7469 Other cirrhosis of liver: Secondary | ICD-10-CM | POA: Diagnosis present

## 2019-12-25 DIAGNOSIS — K7011 Alcoholic hepatitis with ascites: Secondary | ICD-10-CM | POA: Diagnosis present

## 2019-12-25 DIAGNOSIS — R531 Weakness: Secondary | ICD-10-CM

## 2019-12-25 DIAGNOSIS — R17 Unspecified jaundice: Secondary | ICD-10-CM

## 2019-12-25 DIAGNOSIS — R1011 Right upper quadrant pain: Secondary | ICD-10-CM

## 2019-12-25 DIAGNOSIS — K828 Other specified diseases of gallbladder: Secondary | ICD-10-CM | POA: Diagnosis present

## 2019-12-25 DIAGNOSIS — J810 Acute pulmonary edema: Secondary | ICD-10-CM | POA: Diagnosis present

## 2019-12-25 DIAGNOSIS — R609 Edema, unspecified: Secondary | ICD-10-CM

## 2019-12-25 DIAGNOSIS — K3189 Other diseases of stomach and duodenum: Secondary | ICD-10-CM | POA: Diagnosis present

## 2019-12-25 DIAGNOSIS — R197 Diarrhea, unspecified: Secondary | ICD-10-CM

## 2019-12-25 DIAGNOSIS — E876 Hypokalemia: Secondary | ICD-10-CM | POA: Diagnosis present

## 2019-12-25 DIAGNOSIS — R0602 Shortness of breath: Secondary | ICD-10-CM

## 2019-12-25 DIAGNOSIS — K7460 Unspecified cirrhosis of liver: Secondary | ICD-10-CM

## 2019-12-25 DIAGNOSIS — E7220 Disorder of urea cycle metabolism, unspecified: Secondary | ICD-10-CM | POA: Diagnosis present

## 2019-12-25 DIAGNOSIS — D62 Acute posthemorrhagic anemia: Secondary | ICD-10-CM | POA: Diagnosis present

## 2019-12-25 DIAGNOSIS — K7040 Alcoholic hepatic failure without coma: Principal | ICD-10-CM | POA: Diagnosis present

## 2019-12-25 DIAGNOSIS — E871 Hypo-osmolality and hyponatremia: Secondary | ICD-10-CM | POA: Diagnosis present

## 2019-12-25 DIAGNOSIS — F17210 Nicotine dependence, cigarettes, uncomplicated: Secondary | ICD-10-CM | POA: Diagnosis present

## 2019-12-25 DIAGNOSIS — I959 Hypotension, unspecified: Secondary | ICD-10-CM | POA: Diagnosis present

## 2019-12-25 DIAGNOSIS — Z7189 Other specified counseling: Secondary | ICD-10-CM

## 2019-12-25 DIAGNOSIS — N170 Acute kidney failure with tubular necrosis: Secondary | ICD-10-CM | POA: Diagnosis present

## 2019-12-25 DIAGNOSIS — E269 Hyperaldosteronism, unspecified: Secondary | ICD-10-CM | POA: Diagnosis present

## 2019-12-25 DIAGNOSIS — K921 Melena: Secondary | ICD-10-CM

## 2019-12-25 DIAGNOSIS — R7989 Other specified abnormal findings of blood chemistry: Secondary | ICD-10-CM

## 2019-12-25 DIAGNOSIS — K766 Portal hypertension: Secondary | ICD-10-CM | POA: Diagnosis present

## 2019-12-25 LAB — PROTIME-INR
INR: 1.2 (ref 0.8–1.2)
Prothrombin Time: 15 seconds (ref 11.4–15.2)

## 2019-12-25 LAB — COMPREHENSIVE METABOLIC PANEL
ALT: 52 U/L — ABNORMAL HIGH (ref 0–44)
AST: 178 U/L — ABNORMAL HIGH (ref 15–41)
Albumin: 2 g/dL — ABNORMAL LOW (ref 3.5–5.0)
Alkaline Phosphatase: 431 U/L — ABNORMAL HIGH (ref 38–126)
Anion gap: 13 (ref 5–15)
BUN: 36 mg/dL — ABNORMAL HIGH (ref 6–20)
CO2: 19 mmol/L — ABNORMAL LOW (ref 22–32)
Calcium: 8 mg/dL — ABNORMAL LOW (ref 8.9–10.3)
Chloride: 91 mmol/L — ABNORMAL LOW (ref 98–111)
Creatinine, Ser: UNDETERMINED mg/dL (ref 0.44–1.00)
Glucose, Bld: 115 mg/dL — ABNORMAL HIGH (ref 70–99)
Potassium: 3 mmol/L — ABNORMAL LOW (ref 3.5–5.1)
Sodium: 123 mmol/L — ABNORMAL LOW (ref 135–145)
Total Bilirubin: 23.9 mg/dL (ref 0.3–1.2)
Total Protein: 6.3 g/dL — ABNORMAL LOW (ref 6.5–8.1)

## 2019-12-25 LAB — CBC WITH DIFFERENTIAL/PLATELET
Abs Immature Granulocytes: 0.82 10*3/uL — ABNORMAL HIGH (ref 0.00–0.07)
Basophils Absolute: 0.1 10*3/uL (ref 0.0–0.1)
Basophils Relative: 1 %
Eosinophils Absolute: 0 10*3/uL (ref 0.0–0.5)
Eosinophils Relative: 0 %
HCT: 31.8 % — ABNORMAL LOW (ref 36.0–46.0)
Hemoglobin: 11.7 g/dL — ABNORMAL LOW (ref 12.0–15.0)
Immature Granulocytes: 4 %
Lymphocytes Relative: 5 %
Lymphs Abs: 1.1 10*3/uL (ref 0.7–4.0)
MCH: 37 pg — ABNORMAL HIGH (ref 26.0–34.0)
MCHC: 36.8 g/dL — ABNORMAL HIGH (ref 30.0–36.0)
MCV: 100.6 fL — ABNORMAL HIGH (ref 80.0–100.0)
Monocytes Absolute: 1.1 10*3/uL — ABNORMAL HIGH (ref 0.1–1.0)
Monocytes Relative: 5 %
Neutro Abs: 18.3 10*3/uL — ABNORMAL HIGH (ref 1.7–7.7)
Neutrophils Relative %: 85 %
Platelets: 198 10*3/uL (ref 150–400)
RBC: 3.16 MIL/uL — ABNORMAL LOW (ref 3.87–5.11)
RDW: 14.3 % (ref 11.5–15.5)
WBC: 21.6 10*3/uL — ABNORMAL HIGH (ref 4.0–10.5)
nRBC: 0 % (ref 0.0–0.2)

## 2019-12-25 LAB — TROPONIN I (HIGH SENSITIVITY)
Troponin I (High Sensitivity): 36 ng/L — ABNORMAL HIGH (ref <18)
Troponin I (High Sensitivity): 33 ng/L — ABNORMAL HIGH (ref <18)

## 2019-12-25 LAB — PROCALCITONIN: Procalcitonin: 1.72 ng/mL

## 2019-12-25 LAB — BRAIN NATRIURETIC PEPTIDE: B Natriuretic Peptide: 203.2 pg/mL — ABNORMAL HIGH (ref 0.0–100.0)

## 2019-12-25 LAB — AMMONIA: Ammonia: 87 umol/L — ABNORMAL HIGH (ref 9–35)

## 2019-12-25 MED ORDER — SODIUM CHLORIDE 0.9 % IV SOLN
1.0000 g | Freq: Once | INTRAVENOUS | Status: AC
  Administered 2019-12-25: 1 g via INTRAVENOUS
  Filled 2019-12-25: qty 1

## 2019-12-25 NOTE — ED Triage Notes (Signed)
Pt arrives via POV for reports of pain and swelling in bilateral feet x 1 week. Pt has severe pitting edema and skin and eyes are yellow,pt reports this has been going on for 1 week. Pt A&Ox4 and in NAD. No shob observed, speaking in sentences without difficulty.

## 2019-12-25 NOTE — ED Notes (Signed)
Report given to Noah RN

## 2019-12-25 NOTE — ED Notes (Signed)
Patient placed on cardiac monitor. Ultrasound tech is at bedside. Husband is at bedside.

## 2019-12-25 NOTE — ED Notes (Signed)
Pt discussed with Dr. Cinda Quest, see new orders

## 2019-12-25 NOTE — ED Provider Notes (Signed)
[facility] Emergency Department Provider Note   ____________________________________________   First MD Initiated Contact with Patient 12/25/19 2106     (approximate)  I have reviewed the triage vital signs and the nursing notes.   HISTORY  Chief Complaint Leg Swelling    HPI Katherine Goodwin is a 59 y.o. female who comes in with increasing jaundice increasing swelling in the legs and increasing tiredness and weakness.  Patient denies any belly pain except for little bit in the right upper quadrant.  There is no fever.  She has been getting worse for about a week.  No alcohol use recently.  Husband also reports bloody foul-smelling stool.  Patient very weak at home almost fell 3 times yesterday.  This is unusual for her.         Past Medical History:  Diagnosis Date  . Asthma   . Depression   . GERD (gastroesophageal reflux disease)   . Hypertension   . Kidney stones   . Osteoarthrosis, unspecified whether generalized or localized, involving lower leg    leg  . Other bipolar disorders   . Seizure ([place])    none since age 59  . Tobacco abuse   . Trigger finger   . Wears dentures    partial upper    Patient Active Problem List   Diagnosis Date Noted  . Acute hepatic encephalopathy 12/13/2019  . Alcoholic ketoacidosis 95/63/8756  . Alcoholic steatohepatitis 43/32/9518  . Hypokalemia 12/13/2019  . Hypomagnesemia 12/13/2019  . Alcohol use disorder, moderate, dependence ([place]) 12/13/2019  . Macrocytic anemia 12/13/2019  . Liver failure, acute 12/13/2019  . Mild asthma 09/18/2019  . Chronic venous insufficiency 04/04/2019  . Leg pain 04/04/2019  . Essential hypertension 04/04/2019  . PAD (peripheral artery disease) ([place]) 03/25/2019  . Localized edema 03/25/2019  . Tubular adenoma of colon 12/18/2018  . Family history of colonic polyps   . Duodenitis   . Gastritis without bleeding   . Melena 12/03/2018  . Hepatic steatosis 12/03/2018  .  Tobacco dependence 06/12/2015  . Elevated blood pressure 06/12/2015  . 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 Dizziness 10/17/2013  . Abnormal EKG 10/17/2013  . Back pain 10/17/2013  . Bipolar disorder ([place]) 10/17/2013  . Prolonged QT interval 10/17/2013  . Near syncope 10/17/2013    Past Surgical History:  Procedure Laterality Date  . COLONOSCOPY WITH PROPOFOL N/A 12/14/2018   Procedure: COLONOSCOPY WITH PROPOFOL;  Surgeon: Lucilla Lame, MD;  Location: [place];  Service: Endoscopy;  Laterality: N/A;  . ESOPHAGOGASTRODUODENOSCOPY (EGD) WITH PROPOFOL N/A 12/14/2018   Procedure: ESOPHAGOGASTRODUODENOSCOPY (EGD) WITH BIOPSY;  Surgeon: Lucilla Lame, MD;  Location: [place];  Service: Endoscopy;  Laterality: N/A;  . FOOT SURGERY     right foot  . POLYPECTOMY N/A 12/14/2018   Procedure: POLYPECTOMY;  Surgeon: Lucilla Lame, MD;  Location: [place];  Service: Endoscopy;  Laterality: N/A;    Prior to Admission medications   Medication Sig Start Date End Date Taking? Authorizing Provider  diphenhydrAMINE (BENADRYL) 25 MG tablet Take 25 mg by mouth every 6 (six) hours as needed for itching or allergies.     [provider]  ibuprofen (ADVIL,MOTRIN) 800 MG tablet Take 800 mg by mouth every 8 (eight) hours as needed.    [provider]  lactulose (CHRONULAC) 10 GM/15ML solution Take 15 mLs (10 g total) by mouth 3 (three) times daily as needed (to have 2-3 loose BM  a day). 12/15/19   Barb Merino, MD  Multiple Vitamin (MULTIVITAMIN WITH MINERALS) TABS tablet Take 1 tablet by mouth daily. 12/16/19   Barb Merino, MD  rifaximin (XIFAXAN) 550 MG TABS tablet Take 1 tablet (550 mg total) by mouth 2 (two) times daily. 12/15/19 01/14/20  Barb Merino, MD  spironolactone (ALDACTONE) 50 MG tablet Take 1 tablet (50 mg total) by mouth daily. 12/16/19 01/15/20  Barb Merino, MD    Allergies Prozac [fluoxetine hcl], Seroquel [quetiapine  fumarate], and Sulfa antibiotics  Family History  Problem Relation Age of Onset  . Heart disease Father 52       CABG x 3  . Heart attack Father 12       MI  . Hypertension Mother   . Throat cancer Brother     Social History Social History   Tobacco Use  . Smoking status: Current Every Day Smoker    Packs/day: 0.25    Years: 39.00    Pack years: 9.75    Types: Cigarettes  . Smokeless tobacco: Never Used  Vaping Use  . Vaping Use: Never used  Substance Use Topics  . Alcohol use: Yes    Alcohol/week: 7.0 standard drinks    Types: 3 Cans of beer, 4 Shots of liquor per week    Comment: Pt states 3-4 shots a day  . Drug use: Yes    Types: Marijuana    Comment: Smokes Marijuana 2-3 times a month.     Review of Systems  Constitutional: No fever/chills Eyes: No visual changes. ENT: No sore throat. Cardiovascular: Denies chest pain. Respiratory: Denies shortness of breath. Gastrointestinal: No abdominal pain.  No nausea, no vomiting.   diarrhea.  No constipation. Genitourinary: Negative for dysuria. Musculoskeletal: Negative for back pain. Skin: Negative for rash. Neurological: Negative for headaches, focal weakness o  ____________________________________________   PHYSICAL EXAM:  VITAL SIGNS: ED Triage Vitals  Enc Vitals Group     BP 12/25/19 1806 104/73     Pulse Rate 12/25/19 1806 77     Resp 12/25/19 2119 18     Temp 12/25/19 1806 98.5 F (36.9 C)     Temp Source 12/25/19 1806 Oral     SpO2 12/25/19 1806 100 %     Weight 12/25/19 1808 165 lb (74.8 kg)     Height 12/25/19 1808 5\' 5"  (1.651 m)     Head Circumference --      Peak Flow --      Pain Score 12/25/19 1813 8     Pain Loc --      Pain Edu? --      Excl. in [place]? --     Constitutional: Alert and oriented.  Chronically ill-appearing Eyes: Conjunctivae are jaundiced PERRL. EOMI. Head: Atraumatic. Nose: No congestion/rhinnorhea. Mouth/Throat: Mucous membranes are moist.  Oropharynx  jaundiced Neck: No stridor.   Cardiovascular: Normal rate, regular rhythm. Grossly normal heart sounds.  Good peripheral circulation. Respiratory: Normal respiratory effort.  No retractions. Lungs CTAB. Gastrointestinal: Soft and nontender except for very mildly in the right upper quadrant.  Large but not distended no abdominal bruits. No CVA tenderness. Rectal: Liquid stool which is Hemoccult positive Musculoskeletal: No lower extremity tenderness Marked bilateral edema Neurologic:  Normal speech and language. No gross focal neurologic deficits are appreciated.  Skin:  Skin is warm, dry and intact.  Markedly jaundiced   ____________________________________________   LABS (all labs ordered are listed, but only abnormal results are displayed)  Labs Reviewed  CBC WITH  DIFFERENTIAL/PLATELET - Abnormal; Notable for the following components:      Result Value   WBC 21.6 (*)    RBC 3.16 (*)    Hemoglobin 11.7 (*)    HCT 31.8 (*)    MCV 100.6 (*)    MCH 37.0 (*)    MCHC 36.8 (*)    Neutro Abs 18.3 (*)    Monocytes Absolute 1.1 (*)    Abs Immature Granulocytes 0.82 (*)    All other components within normal limits  BRAIN NATRIURETIC PEPTIDE - Abnormal; Notable for the following components:   B Natriuretic Peptide 203.2 (*)    All other components within normal limits  COMPREHENSIVE METABOLIC PANEL - Abnormal; Notable for the following components:   Sodium 123 (*)    Potassium 3.0 (*)    Chloride 91 (*)    CO2 19 (*)    Glucose, Bld 115 (*)    BUN 36 (*)    Calcium 8.0 (*)    Total Protein 6.3 (*)    Albumin 2.0 (*)    AST 178 (*)    ALT 52 (*)    Alkaline Phosphatase 431 (*)    Total Bilirubin 23.9 (*)    All other components within normal limits  AMMONIA - Abnormal; Notable for the following components:   Ammonia 87 (*)    All other components within normal limits  TROPONIN I (HIGH SENSITIVITY) - Abnormal; Notable for the following components:   Troponin I (High  Sensitivity) 36 (*)    All other components within normal limits  TROPONIN I (HIGH SENSITIVITY) - Abnormal; Notable for the following components:   Troponin I (High Sensitivity) 33 (*)    All other components within normal limits  GASTROINTESTINAL PANEL BY PCR, STOOL (REPLACES STOOL CULTURE)  C DIFFICILE QUICK SCREEN W PCR REFLEX  PROTIME-INR  PROCALCITONIN   ____________________________________________  EKG   ____________________________________________  RADIOLOGY  ED MD interpretation:    Official radiology report(s): US Abdomen Limited RUQ  Result Date: 12/25/2019 CLINICAL DATA:  Right upper quadrant pain. EXAM: ULTRASOUND ABDOMEN LIMITED RIGHT UPPER QUADRANT COMPARISON:  None. FINDINGS: Gallbladder: Echogenic sludge and subcentimeter shadowing echogenic gallstones are seen within the gallbladder lumen. Gallbladder wall thickening is seen (5.3 mm). No sonographic Murphy sign noted by sonographer. Common bile duct: Diameter: 4.7 mm (limited in visualization) Liver: No focal lesion identified. Diffusely increased echogenicity of the liver parenchyma is seen. Evaluation of the portal vein is markedly limited secondary to the increased echogenicity of the liver. As a result, portal vein patency and direction of flow cannot be confirmed. Other: None. IMPRESSION: 1. Cholelithiasis and gallbladder sludge without evidence of acute cholecystitis. 2. Fatty liver. 3. Markedly limited evaluation of the portal vein, as described above. Electronically Signed   By: Virgina Norfolk M.D.   On: 12/25/2019 23:03    ____________________________________________   PROCEDURES  Procedure(s) performed (including Critical Care): Critical care time 45 minutes.  This includes reviewing the patient's labs examining her reexamining her with her husband present talking to her husband reviewing her old records and discussing her with the oncoming emergency room  physician  Procedures   ____________________________________________   INITIAL IMPRESSION / [place] / ED COURSE     Patient is extremely ill with edema worsening jaundice elevated white count bloody diarrhea hyponatremia and a number of other problems.  We will have to get her in the hospital.  We are going to attempt to ultrasound and then also get a paracentesis  and evaluate for spontaneous bacterial peritonitis.  Patient's ammonia is also elevated in spite of her taking her lactulose.  Patient's husband confirms the patient's insistence that she is not drinking.      ____________________________________________   FINAL CLINICAL IMPRESSION(S) / ED DIAGNOSES  Final diagnoses:  Peripheral edema  Hypokalemia  Hyponatremia  Jaundice  Elevated liver function tests  Leukocytosis, unspecified type  Hyperammonemia (HCC)  Bloody diarrhea  Weakness     ED Discharge Orders         Ordered    US Paracentesis     Discontinue     12/25/19 2225           Note:  This document was prepared using Dragon voice recognition software and may include unintentional dictation errors.    Nena Polio, MD 12/25/19 2326

## 2019-12-25 NOTE — H&P (Signed)
History and Physical    Katherine Goodwin YQM:[id number] DOB: [birth year] DOA: 12/25/2019  PCP: Glean Hess, MD  Patient coming from: Home  I have personally briefly reviewed patient's old medical records in [place]  Chief Complaint: Edema, jaundice  HPI: Katherine Goodwin is a 59 y.o. female with medical history significant for alcohol use disorder with severe hepatic steatosis and recent imaging suggestive of portal hypertension with paraesophageal and proximal perigastric varices, hypertension, asthma, and bipolar disorder who presents to the ED for evaluation of increasing lower extremity edema, jaundice, weakness, and blood in stool.  Patient was recently admitted from 12/12/2019-12/15/2019 for hepatic encephalopathy felt secondary to alcohol-related hepatic dysfunction.  CT abdomen/pelvis imaging 12/12/2019 showed progressive hepatomegaly and severe hepatic steatosis, portal hypertension with recanalized umbilical vein, paraesophageal and proximal perigastric varices, small volume abdominal pelvic ascites.  Abdominal ultrasound 12/13/2019 showed trace abdominal pelvic ascites not amenable to paracentesis.  GI were consulted and recommended continued medical management.  Patient was discharged on lactulose 10 g TID (to maintain 2-3 BMs per day), spironolactone 50 mg daily, and rifaximin twice daily.  Lasix was not initiated due to hypokalemia and soft blood pressures.  Home health PT/OT was recommended due to deconditioning.  Acute hepatitis panel was negative as well as further viral hepatitis work-up with EBV, CMV, varicella-zoster, hepatitis B, and hepatitis C studies.  Patient states she has had 1 week of progressive swelling in her lower extremities and abdomen.  She has had worsening yellowing of her skin and eyes.  She has had some shortness of breath and wheezing without cough.  She has had some lightheadedness and dizziness with 3 near falls yesterday.  She denies any actual falls,  injuries, or loss of consciousness.  She is having frequent semisoft bowel movements occurring 5-6 times per day.  She has seen blood in her stool which has varied in color from bright red to dark black.  Husband states that stool has been malodorous.  She states she has been feeling thirsty and has been drinking a lot of water.  She has a history of chronic alcohol use but reports abstinence for the last 2 weeks.  ED Course:  Initial vitals showed BP 104/73, pulse 77, RR 18, temp 98.5 Fahrenheit, SPO2 100% on room air.  Labs are notable for AST 178, ALT 52, alk phos 431, total bilirubin 23.9, sodium 123, potassium 3.0, bicarb 19, BUN 36, creatinine on reportable due to icterus, albumin 2.0, total protein 6.3, WBC 21.6, hemoglobin 11.7, platelets 198,000, BNP 203.2, INR 1.2, ammonia 87, pro calcitonin 1.72.  High-sensitivity troponin I 36 >> 33.  RUQ ultrasound showed cholelithiasis and gallbladder sludge without evidence of acute cholecystitis, CBD diameter 4.7 mm, fatty liver.  Portal vein evaluation was limited due to increased echogenicity of the liver.  Per EDP, patient did have blood per rectum with positive FOBT.  Patient was given empiric IV cefepime.  C. difficile and GI pathogen panels were ordered and pending.  Ultrasound paracentesis was ordered and pending.  EDP discussed with on-call GI who recommended obtaining MRCP, correcting electrolyte abnormalities, and continued empiric antibiotics for now.  The hospitalist service was consulted to admit for further evaluation and management.  Review of Systems: All systems reviewed and are negative except as documented in history of present illness above.   Past Medical History:  Diagnosis Date  . Asthma   . Depression   . GERD (gastroesophageal reflux disease)   . Hypertension   . Kidney  stones   . Osteoarthrosis, unspecified whether generalized or localized, involving lower leg    leg  . Other bipolar disorders   . Seizure ([place])     none since age 4  . Tobacco abuse   . Trigger finger   . Wears dentures    partial upper    Past Surgical History:  Procedure Laterality Date  . COLONOSCOPY WITH PROPOFOL N/A 12/14/2018   Procedure: COLONOSCOPY WITH PROPOFOL;  Surgeon: Lucilla Lame, MD;  Location: [place];  Service: Endoscopy;  Laterality: N/A;  . ESOPHAGOGASTRODUODENOSCOPY (EGD) WITH PROPOFOL N/A 12/14/2018   Procedure: ESOPHAGOGASTRODUODENOSCOPY (EGD) WITH BIOPSY;  Surgeon: Lucilla Lame, MD;  Location: [place];  Service: Endoscopy;  Laterality: N/A;  . FOOT SURGERY     right foot  . POLYPECTOMY N/A 12/14/2018   Procedure: POLYPECTOMY;  Surgeon: Lucilla Lame, MD;  Location: [place];  Service: Endoscopy;  Laterality: N/A;    Social History:  reports that she has been smoking cigarettes. She has a 9.75 pack-year smoking history. She has never used smokeless tobacco. She reports current alcohol use of about 7.0 standard drinks of alcohol per week. She reports current drug use. Drug: Marijuana.  Allergies  Allergen Reactions  . Prozac [Fluoxetine Hcl] Other (See Comments)    Homicidal and suicidal thoughts  . Seroquel [Quetiapine Fumarate] Other (See Comments)    Homicidal and suicidal thoughts  . Sulfa Antibiotics Nausea And Vomiting    Family History  Problem Relation Age of Onset  . Heart disease Father 42       CABG x 3  . Heart attack Father 52       MI  . Hypertension Mother   . Throat cancer Brother      Prior to Admission medications   Medication Sig Start Date End Date Taking? Authorizing Provider  diphenhydrAMINE (BENADRYL) 25 MG tablet Take 25 mg by mouth every 6 (six) hours as needed for itching or allergies.    Yes [provider]  ibuprofen (ADVIL,MOTRIN) 800 MG tablet Take 800 mg by mouth every 8 (eight) hours as needed.   Yes [provider]  lactulose (CHRONULAC) 10 GM/15ML solution Take 15 mLs (10 g total) by mouth 3 (three) times  daily as needed (to have 2-3 loose BM a day). 12/15/19  Yes Barb Merino, MD  Multiple Vitamin (MULTIVITAMIN WITH MINERALS) TABS tablet Take 1 tablet by mouth daily. 12/16/19  Yes Barb Merino, MD  rifaximin (XIFAXAN) 550 MG TABS tablet Take 1 tablet (550 mg total) by mouth 2 (two) times daily. 12/15/19 01/14/20 Yes Ghimire, Dante Gang, MD  spironolactone (ALDACTONE) 50 MG tablet Take 1 tablet (50 mg total) by mouth daily. 12/16/19 01/15/20 Yes Barb Merino, MD    Physical Exam: Vitals:   12/25/19 1808 12/25/19 1813 12/25/19 2119 12/25/19 2345  BP:   (!) 118/99 112/72  Pulse:   78 75  Resp:   18 16  Temp:      TempSrc:      SpO2:   100% 98%  Weight: 74.8 kg 74.8 kg    Height: '5\' 5"'  (1.651 m) '5\' 5"'  (1.651 m)     Constitutional: Chronically ill-appearing woman resting supine in bed, appears tired but comfortable Eyes: PERRL, scleral icterus present ENMT: Mucous membranes are moist. Posterior pharynx clear of any exudate or lesions. Neck: normal, supple, no masses. Respiratory: clear to auscultation bilaterally, no wheezing, no crackles. Normal respiratory effort. No accessory muscle use.  Cardiovascular: Regular rate and rhythm,  no murmurs / rubs / gallops.  +1 bilateral lower extremity edema. Abdomen: Jaundiced abdomen without significant distention, no tenderness, no masses palpated. Bowel sounds positive.  Musculoskeletal: no clubbing / cyanosis. No joint deformity upper and lower extremities. Good ROM, no contractures. Normal muscle tone.  Skin: Significant jaundice, no rashes, lesions, ulcers. No induration Neurologic: CN 2-12 grossly intact. Sensation intact, Strength 5/5 in all 4.  Psychiatric: Alert and oriented x 3.   Labs on Admission: I have personally reviewed following labs and imaging studies  CBC: Recent Labs  Lab 12/25/19 1814  WBC 21.6*  NEUTROABS 18.3*  HGB 11.7*  HCT 31.8*  MCV 100.6*  PLT 606   Basic Metabolic Panel: Recent Labs  Lab 12/25/19 1814  NA 123*   K 3.0*  CL 91*  CO2 19*  GLUCOSE 115*  BUN 36*  CREATININE UNABLE TO REPORT DUE TO ICTERUS  CALCIUM 8.0*   GFR: CrCl cannot be calculated (This lab value cannot be used to calculate CrCl because it is not a number: UNABLE TO REPORT DUE TO ICTERUS). Liver Function Tests: Recent Labs  Lab 12/25/19 1814  AST 178*  ALT 52*  ALKPHOS 431*  BILITOT 23.9*  PROT 6.3*  ALBUMIN 2.0*   No results for input(s): LIPASE, AMYLASE in the last 168 hours. Recent Labs  Lab 12/25/19 2146  AMMONIA 87*   Coagulation Profile: Recent Labs  Lab 12/25/19 1814  INR 1.2   Cardiac Enzymes: No results for input(s): CKTOTAL, CKMB, CKMBINDEX, TROPONINI in the last 168 hours. BNP (last 3 results) No results for input(s): PROBNP in the last 8760 hours. HbA1C: No results for input(s): HGBA1C in the last 72 hours. CBG: No results for input(s): GLUCAP in the last 168 hours. Lipid Profile: No results for input(s): CHOL, HDL, LDLCALC, TRIG, CHOLHDL, LDLDIRECT in the last 72 hours. Thyroid Function Tests: No results for input(s): TSH, T4TOTAL, FREET4, T3FREE, THYROIDAB in the last 72 hours. Anemia Panel: No results for input(s): VITAMINB12, FOLATE, FERRITIN, TIBC, IRON, RETICCTPCT in the last 72 hours. Urine analysis:    Component Value Date/Time   BILIRUBINUR neg 07/17/2019 0934   PROTEINUR Positive (A) 07/17/2019 0934   UROBILINOGEN 0.2 07/17/2019 0934   NITRITE neg 07/17/2019 0934   LEUKOCYTESUR Negative 07/17/2019 0934    Radiological Exams on Admission: US Abdomen Limited RUQ  Result Date: 12/25/2019 CLINICAL DATA:  Right upper quadrant pain. EXAM: ULTRASOUND ABDOMEN LIMITED RIGHT UPPER QUADRANT COMPARISON:  None. FINDINGS: Gallbladder: Echogenic sludge and subcentimeter shadowing echogenic gallstones are seen within the gallbladder lumen. Gallbladder wall thickening is seen (5.3 mm). No sonographic Murphy sign noted by sonographer. Common bile duct: Diameter: 4.7 mm (limited in  visualization) Liver: No focal lesion identified. Diffusely increased echogenicity of the liver parenchyma is seen. Evaluation of the portal vein is markedly limited secondary to the increased echogenicity of the liver. As a result, portal vein patency and direction of flow cannot be confirmed. Other: None. IMPRESSION: 1. Cholelithiasis and gallbladder sludge without evidence of acute cholecystitis. 2. Fatty liver. 3. Markedly limited evaluation of the portal vein, as described above. Electronically Signed   By: Virgina Norfolk M.D.   On: 12/25/2019 23:03    EKG: Not performed.  Assessment/Plan Principal Problem:   Decompensated liver disease ([place]) Active Problems:   Blood in stool   Alcoholic steatohepatitis   Hypokalemia   Hyperbilirubinemia   Elevated LFTs   Hyponatremia   Portal hypertension (HCC)  TENNA LACKO is a 59 y.o. female with medical  history significant for alcohol use disorder with severe hepatic steatosis and recent imaging suggestive of portal hypertension with paraesophageal and proximal perigastric varices, hypertension, asthma, and bipolar disorder who is admitted with decompensated liver disease with hyperbilirubinemia.   Decompensated liver disease with hyperbilirubinemia/transaminitis: Labs notable for T bili 23.9, ammonia 87, INR 1.2, AST 178, ALT 52, alk phos 431, WBC 21.6, procalcitonin 1.72.  She is currently awake, alert, and oriented x3.  Recent CT imaging 12/12/19 findings of severe hepatic steatosis, progressive hepatomegaly, and evidence of portal hypertension with paraesophageal and proximal perigastric varices.  RUQ [place] 12/25/19 shows cholelithiasis without evidence of acute cholecystitis, CBD diameter 4.7 mm. -EDP discussed with GI will see in the morning, further recommendations appreciated -Follow MRCP -US paracentesis ordered to evaluate for potential SBP, continue empiric IV ceftriaxone for now -Hold home lactulose, rifaximin given report of excessive  bowel movements; restart as soon as able due to high risk for hepatic encephalopathy -Holding spironolactone due to hypotension on admission -Not on Lasix due to hypokalemia and soft blood pressures  Blood in stool: Reports of BRBPR and melena with bowel movements over the last week.  FOBT positive per EDP.  Hemoglobin currently stable at 11.7.  Continue monitoring and appreciate any further GI recommendations.  Hypokalemia: Magnesium 1.8.  Will replete IV magnesium followed with IV and oral potassium.  Hyponatremia: Likely due to decompensated liver disease.  May be some component of dehydration.  Check urinalysis, urine sodium and osmolality, serum osmolality.  Alcohol use disorder: Prior history of significant alcohol use however reports complete cessation for the last 2 weeks.  DVT prophylaxis: SCDs Code Status: Full code, confirmed with patient Family Communication: Discussed with patient's significant other at bedside. Disposition Plan: From home, discharge pending further evaluation and management of decompensated liver disease Consults called: GI Admission status:  Status is: Inpatient  Remains inpatient appropriate because:Hemodynamically unstable, Persistent severe electrolyte disturbances, Ongoing diagnostic testing needed not appropriate for outpatient work up, IV treatments appropriate due to intensity of illness or inability to take PO and Inpatient level of care appropriate due to severity of illness  Dispo: The patient is from: Home              Anticipated d/c is to: Home              Anticipated d/c date is: 3 days pending further evaluation/management of decompensated liver disease.              Patient currently is not medically stable to d/c.   Zada Finders MD [facility]  If 7PM-7AM, please contact night-coverage [email or web address]  12/26/2019, 12:18 AM

## 2019-12-26 ENCOUNTER — Inpatient Hospital Stay: Payer: Medicaid Other

## 2019-12-26 ENCOUNTER — Inpatient Hospital Stay (HOSPITAL_COMMUNITY)
Admit: 2019-12-26 | Discharge: 2019-12-26 | Disposition: A | Discharge status: Home / Self Care | Payer: Medicaid Other | Attending: Pulmonary Disease | Admitting: Pulmonary Disease

## 2019-12-26 ENCOUNTER — Other Ambulatory Visit: Payer: Self-pay

## 2019-12-26 DIAGNOSIS — K7010 Alcoholic hepatitis without ascites: Secondary | ICD-10-CM

## 2019-12-26 DIAGNOSIS — K766 Portal hypertension: Secondary | ICD-10-CM | POA: Diagnosis present

## 2019-12-26 DIAGNOSIS — K7469 Other cirrhosis of liver: Secondary | ICD-10-CM | POA: Diagnosis present

## 2019-12-26 DIAGNOSIS — E269 Hyperaldosteronism, unspecified: Secondary | ICD-10-CM | POA: Diagnosis present

## 2019-12-26 DIAGNOSIS — D62 Acute posthemorrhagic anemia: Secondary | ICD-10-CM | POA: Diagnosis present

## 2019-12-26 DIAGNOSIS — E876 Hypokalemia: Secondary | ICD-10-CM | POA: Diagnosis present

## 2019-12-26 DIAGNOSIS — K219 Gastro-esophageal reflux disease without esophagitis: Secondary | ICD-10-CM | POA: Diagnosis present

## 2019-12-26 DIAGNOSIS — K7040 Alcoholic hepatic failure without coma: Secondary | ICD-10-CM | POA: Diagnosis present

## 2019-12-26 DIAGNOSIS — Z515 Encounter for palliative care: Secondary | ICD-10-CM | POA: Diagnosis not present

## 2019-12-26 DIAGNOSIS — E7220 Disorder of urea cycle metabolism, unspecified: Secondary | ICD-10-CM | POA: Diagnosis present

## 2019-12-26 DIAGNOSIS — K3189 Other diseases of stomach and duodenum: Secondary | ICD-10-CM | POA: Diagnosis present

## 2019-12-26 DIAGNOSIS — K8020 Calculus of gallbladder without cholecystitis without obstruction: Secondary | ICD-10-CM | POA: Diagnosis present

## 2019-12-26 DIAGNOSIS — R7989 Other specified abnormal findings of blood chemistry: Secondary | ICD-10-CM

## 2019-12-26 DIAGNOSIS — I340 Nonrheumatic mitral (valve) insufficiency: Secondary | ICD-10-CM | POA: Diagnosis not present

## 2019-12-26 DIAGNOSIS — Z20822 Contact with and (suspected) exposure to covid-19: Secondary | ICD-10-CM | POA: Diagnosis present

## 2019-12-26 DIAGNOSIS — D72829 Elevated white blood cell count, unspecified: Secondary | ICD-10-CM | POA: Diagnosis present

## 2019-12-26 DIAGNOSIS — K921 Melena: Secondary | ICD-10-CM | POA: Diagnosis present

## 2019-12-26 DIAGNOSIS — J45909 Unspecified asthma, uncomplicated: Secondary | ICD-10-CM | POA: Diagnosis present

## 2019-12-26 DIAGNOSIS — J810 Acute pulmonary edema: Secondary | ICD-10-CM | POA: Diagnosis present

## 2019-12-26 DIAGNOSIS — Z7189 Other specified counseling: Secondary | ICD-10-CM | POA: Diagnosis not present

## 2019-12-26 DIAGNOSIS — K828 Other specified diseases of gallbladder: Secondary | ICD-10-CM | POA: Diagnosis present

## 2019-12-26 DIAGNOSIS — K7290 Hepatic failure, unspecified without coma: Secondary | ICD-10-CM | POA: Diagnosis not present

## 2019-12-26 DIAGNOSIS — E871 Hypo-osmolality and hyponatremia: Secondary | ICD-10-CM | POA: Diagnosis present

## 2019-12-26 DIAGNOSIS — F1020 Alcohol dependence, uncomplicated: Secondary | ICD-10-CM | POA: Diagnosis present

## 2019-12-26 DIAGNOSIS — K7011 Alcoholic hepatitis with ascites: Secondary | ICD-10-CM | POA: Diagnosis present

## 2019-12-26 DIAGNOSIS — F319 Bipolar disorder, unspecified: Secondary | ICD-10-CM | POA: Diagnosis present

## 2019-12-26 DIAGNOSIS — F17210 Nicotine dependence, cigarettes, uncomplicated: Secondary | ICD-10-CM | POA: Diagnosis present

## 2019-12-26 DIAGNOSIS — K7031 Alcoholic cirrhosis of liver with ascites: Secondary | ICD-10-CM | POA: Diagnosis present

## 2019-12-26 DIAGNOSIS — I959 Hypotension, unspecified: Secondary | ICD-10-CM | POA: Diagnosis present

## 2019-12-26 DIAGNOSIS — I10 Essential (primary) hypertension: Secondary | ICD-10-CM | POA: Diagnosis present

## 2019-12-26 DIAGNOSIS — R0602 Shortness of breath: Secondary | ICD-10-CM | POA: Diagnosis present

## 2019-12-26 DIAGNOSIS — N170 Acute kidney failure with tubular necrosis: Secondary | ICD-10-CM | POA: Diagnosis present

## 2019-12-26 LAB — CBC
HCT: 28.4 % — ABNORMAL LOW (ref 36.0–46.0)
Hemoglobin: 10.2 g/dL — ABNORMAL LOW (ref 12.0–15.0)
MCH: 36.8 pg — ABNORMAL HIGH (ref 26.0–34.0)
MCHC: 35.9 g/dL (ref 30.0–36.0)
MCV: 102.5 fL — ABNORMAL HIGH (ref 80.0–100.0)
Platelets: 144 10*3/uL — ABNORMAL LOW (ref 150–400)
RBC: 2.77 MIL/uL — ABNORMAL LOW (ref 3.87–5.11)
RDW: 13.9 % (ref 11.5–15.5)
WBC: 18.8 10*3/uL — ABNORMAL HIGH (ref 4.0–10.5)
nRBC: 0.1 % (ref 0.0–0.2)

## 2019-12-26 LAB — BASIC METABOLIC PANEL
Anion gap: 8 (ref 5–15)
BUN: 39 mg/dL — ABNORMAL HIGH (ref 6–20)
CO2: 19 mmol/L — ABNORMAL LOW (ref 22–32)
Calcium: 7.2 mg/dL — ABNORMAL LOW (ref 8.9–10.3)
Chloride: 94 mmol/L — ABNORMAL LOW (ref 98–111)
Creatinine, Ser: 1.41 mg/dL — ABNORMAL HIGH (ref 0.44–1.00)
GFR calc Af Amer: 48 mL/min — ABNORMAL LOW (ref >60)
GFR calc non Af Amer: 41 mL/min — ABNORMAL LOW (ref >60)
Glucose, Bld: 135 mg/dL — ABNORMAL HIGH (ref 70–99)
Potassium: 3.2 mmol/L — ABNORMAL LOW (ref 3.5–5.1)
Sodium: 121 mmol/L — ABNORMAL LOW (ref 135–145)

## 2019-12-26 LAB — URINE DRUG SCREEN, QUALITATIVE (ARMC ONLY)
Amphetamines, Ur Screen: NOT DETECTED
Barbiturates, Ur Screen: NOT DETECTED
Benzodiazepine, Ur Scrn: NOT DETECTED
Cannabinoid 50 Ng, Ur ~~LOC~~: NOT DETECTED
Cocaine Metabolite,Ur ~~LOC~~: NOT DETECTED
MDMA (Ecstasy)Ur Screen: NOT DETECTED
Methadone Scn, Ur: NOT DETECTED
Opiate, Ur Screen: NOT DETECTED
Phencyclidine (PCP) Ur S: NOT DETECTED
Tricyclic, Ur Screen: NOT DETECTED

## 2019-12-26 LAB — COMPREHENSIVE METABOLIC PANEL
ALT: 37 U/L (ref 0–44)
AST: 135 U/L — ABNORMAL HIGH (ref 15–41)
Albumin: 2.9 g/dL — ABNORMAL LOW (ref 3.5–5.0)
Alkaline Phosphatase: 285 U/L — ABNORMAL HIGH (ref 38–126)
Anion gap: 9 (ref 5–15)
BUN: 35 mg/dL — ABNORMAL HIGH (ref 6–20)
CO2: 21 mmol/L — ABNORMAL LOW (ref 22–32)
Calcium: 7.8 mg/dL — ABNORMAL LOW (ref 8.9–10.3)
Chloride: 94 mmol/L — ABNORMAL LOW (ref 98–111)
Creatinine, Ser: 1.26 mg/dL — ABNORMAL HIGH (ref 0.44–1.00)
GFR calc Af Amer: 55 mL/min — ABNORMAL LOW (ref >60)
GFR calc non Af Amer: 47 mL/min — ABNORMAL LOW (ref >60)
Glucose, Bld: 65 mg/dL — ABNORMAL LOW (ref 70–99)
Potassium: 3.5 mmol/L (ref 3.5–5.1)
Sodium: 124 mmol/L — ABNORMAL LOW (ref 135–145)
Total Bilirubin: 22.7 mg/dL (ref 0.3–1.2)
Total Protein: 5.7 g/dL — ABNORMAL LOW (ref 6.5–8.1)

## 2019-12-26 LAB — CBC WITH DIFFERENTIAL/PLATELET
Abs Immature Granulocytes: 0.59 10*3/uL — ABNORMAL HIGH (ref 0.00–0.07)
Basophils Absolute: 0.1 10*3/uL (ref 0.0–0.1)
Basophils Relative: 1 %
Eosinophils Absolute: 0 10*3/uL (ref 0.0–0.5)
Eosinophils Relative: 0 %
HCT: 24.7 % — ABNORMAL LOW (ref 36.0–46.0)
Hemoglobin: 9.2 g/dL — ABNORMAL LOW (ref 12.0–15.0)
Immature Granulocytes: 4 %
Lymphocytes Relative: 7 %
Lymphs Abs: 1.1 10*3/uL (ref 0.7–4.0)
MCH: 37.1 pg — ABNORMAL HIGH (ref 26.0–34.0)
MCHC: 37.2 g/dL — ABNORMAL HIGH (ref 30.0–36.0)
MCV: 99.6 fL (ref 80.0–100.0)
Monocytes Absolute: 1.1 10*3/uL — ABNORMAL HIGH (ref 0.1–1.0)
Monocytes Relative: 7 %
Neutro Abs: 13.4 10*3/uL — ABNORMAL HIGH (ref 1.7–7.7)
Neutrophils Relative %: 81 %
Platelets: 144 10*3/uL — ABNORMAL LOW (ref 150–400)
RBC: 2.48 MIL/uL — ABNORMAL LOW (ref 3.87–5.11)
RDW: 14.2 % (ref 11.5–15.5)
WBC: 16.2 10*3/uL — ABNORMAL HIGH (ref 4.0–10.5)
nRBC: 0.1 % (ref 0.0–0.2)

## 2019-12-26 LAB — GASTROINTESTINAL PANEL BY PCR, STOOL (REPLACES STOOL CULTURE)

## 2019-12-26 LAB — AMYLASE: Amylase: 90 U/L (ref 28–100)

## 2019-12-26 LAB — HEMOGLOBIN AND HEMATOCRIT, BLOOD
HCT: 24.9 % — ABNORMAL LOW (ref 36.0–46.0)
HCT: 25 % — ABNORMAL LOW (ref 36.0–46.0)
HCT: 25.8 % — ABNORMAL LOW (ref 36.0–46.0)
Hemoglobin: 9.1 g/dL — ABNORMAL LOW (ref 12.0–15.0)
Hemoglobin: 9.3 g/dL — ABNORMAL LOW (ref 12.0–15.0)
Hemoglobin: 9.1 g/dL — ABNORMAL LOW (ref 12.0–15.0)

## 2019-12-26 LAB — URINALYSIS, COMPLETE (UACMP) WITH MICROSCOPIC
Bilirubin Urine: NEGATIVE
Glucose, UA: NEGATIVE mg/dL
Ketones, ur: NEGATIVE mg/dL
Nitrite: NEGATIVE
Protein, ur: NEGATIVE mg/dL
Specific Gravity, Urine: 1.013 (ref 1.005–1.030)
pH: 6 (ref 5.0–8.0)

## 2019-12-26 LAB — HEPATIC FUNCTION PANEL
ALT: 41 U/L (ref 0–44)
AST: 145 U/L — ABNORMAL HIGH (ref 15–41)
Albumin: 1.5 g/dL — ABNORMAL LOW (ref 3.5–5.0)
Alkaline Phosphatase: 322 U/L — ABNORMAL HIGH (ref 38–126)
Bilirubin, Direct: 11.5 mg/dL — ABNORMAL HIGH (ref 0.0–0.2)
Indirect Bilirubin: 7 mg/dL — ABNORMAL HIGH (ref 0.3–0.9)
Total Bilirubin: 18.5 mg/dL (ref 0.3–1.2)
Total Protein: 4.9 g/dL — ABNORMAL LOW (ref 6.5–8.1)

## 2019-12-26 LAB — LIPASE, BLOOD: Lipase: 92 U/L — ABNORMAL HIGH (ref 11–51)

## 2019-12-26 LAB — LACTIC ACID, PLASMA
Lactic Acid, Venous: 1.5 mmol/L (ref 0.5–1.9)
Lactic Acid, Venous: 1.2 mmol/L (ref 0.5–1.9)

## 2019-12-26 LAB — PROTIME-INR
INR: 1.3 — ABNORMAL HIGH (ref 0.8–1.2)
Prothrombin Time: 15.5 seconds — ABNORMAL HIGH (ref 11.4–15.2)

## 2019-12-26 LAB — ECHOCARDIOGRAM COMPLETE
Height: 65 in
Weight: 2640 oz

## 2019-12-26 LAB — C DIFFICILE QUICK SCREEN W PCR REFLEX
C Diff antigen: POSITIVE — AB
C Diff toxin: NEGATIVE

## 2019-12-26 LAB — MAGNESIUM
Magnesium: 1.8 mg/dL (ref 1.7–2.4)
Magnesium: 2.6 mg/dL — ABNORMAL HIGH (ref 1.7–2.4)

## 2019-12-26 LAB — SODIUM, URINE, RANDOM: Sodium, Ur: <10 mmol/L

## 2019-12-26 LAB — CLOSTRIDIUM DIFFICILE BY PCR, REFLEXED: Toxigenic C. Difficile by PCR: NEGATIVE

## 2019-12-26 LAB — PROCALCITONIN: Procalcitonin: 1.42 ng/mL

## 2019-12-26 LAB — OSMOLALITY: Osmolality: 264 mOsm/kg — ABNORMAL LOW (ref 275–295)

## 2019-12-26 LAB — SARS CORONAVIRUS 2 BY RT PCR (HOSPITAL ORDER, PERFORMED IN ~~LOC~~ HOSPITAL LAB): SARS Coronavirus 2: NEGATIVE

## 2019-12-26 LAB — STREP PNEUMONIAE URINARY ANTIGEN: Strep Pneumo Urinary Antigen: NEGATIVE

## 2019-12-26 LAB — OSMOLALITY, URINE: Osmolality, Ur: 344 mOsm/kg (ref 300–900)

## 2019-12-26 MED ORDER — RIFAXIMIN 550 MG PO TABS: 550.0000 mg | ORAL_TABLET | Freq: Two times a day (BID) | ORAL | Status: DC

## 2019-12-26 MED ORDER — POTASSIUM CHLORIDE 10 MEQ/100ML IV SOLN
10.0000 meq | INTRAVENOUS | Status: AC
  Administered 2019-12-26 (×3): 10 meq via INTRAVENOUS
  Filled 2019-12-26 (×3): qty 100

## 2019-12-26 MED ORDER — PANTOPRAZOLE SODIUM 40 MG IV SOLR
40.0000 mg | Freq: Two times a day (BID) | INTRAVENOUS | Status: DC
  Administered 2019-12-26 – 2019-12-28 (×6): 40 mg via INTRAVENOUS
  Filled 2019-12-26 (×6): qty 40

## 2019-12-26 MED ORDER — POTASSIUM CHLORIDE 20 MEQ PO PACK
40.0000 meq | PACK | Freq: Once | ORAL | Status: AC
  Administered 2019-12-26: 40 meq via ORAL
  Filled 2019-12-26: qty 2

## 2019-12-26 MED ORDER — POTASSIUM CHLORIDE 10 MEQ/100ML IV SOLN
10.0000 meq | Freq: Once | INTRAVENOUS | Status: AC
  Administered 2019-12-26: 10 meq via INTRAVENOUS

## 2019-12-26 MED ORDER — SODIUM CHLORIDE 0.9 % IV SOLN: 250.0000 mL | INTRAVENOUS | Status: DC

## 2019-12-26 MED ORDER — SODIUM CHLORIDE 1 G PO TABS
2.0000 g | ORAL_TABLET | Freq: Three times a day (TID) | ORAL | Status: DC
  Administered 2019-12-26 – 2019-12-28 (×3): 2 g via ORAL
  Filled 2019-12-26 (×6): qty 2

## 2019-12-26 MED ORDER — SODIUM CHLORIDE 0.9 % IV SOLN
2.0000 g | INTRAVENOUS | Status: DC
  Administered 2019-12-26 – 2019-12-29 (×4): 2 g via INTRAVENOUS
  Filled 2019-12-26 (×2): qty 2
  Filled 2019-12-26: qty 20
  Filled 2019-12-26: qty 2

## 2019-12-26 MED ORDER — OXYCODONE HCL 5 MG PO TABS
5.0000 mg | ORAL_TABLET | Freq: Four times a day (QID) | ORAL | Status: DC | PRN
  Administered 2019-12-26 – 2019-12-28 (×2): 5 mg via ORAL
  Filled 2019-12-26 (×2): qty 1

## 2019-12-26 MED ORDER — MAGNESIUM SULFATE IN D5W 1-5 GM/100ML-% IV SOLN
1.0000 g | Freq: Once | INTRAVENOUS | Status: AC
  Administered 2019-12-26: 1 g via INTRAVENOUS
  Filled 2019-12-26: qty 100

## 2019-12-26 MED ORDER — LACTULOSE 10 GM/15ML PO SOLN
30.0000 g | Freq: Three times a day (TID) | ORAL | Status: DC
  Administered 2019-12-26 – 2019-12-30 (×11): 30 g via ORAL
  Filled 2019-12-26 (×13): qty 60

## 2019-12-26 MED ORDER — NOREPINEPHRINE 4 MG/250ML-% IV SOLN
2.0000 ug/min | INTRAVENOUS | Status: DC
  Administered 2019-12-26: 2 ug/min via INTRAVENOUS
  Filled 2019-12-26: qty 250

## 2019-12-26 MED ORDER — ADULT MULTIVITAMIN W/MINERALS CH
1.0000 | ORAL_TABLET | Freq: Every day | ORAL | Status: DC
  Administered 2019-12-26 – 2019-12-31 (×5): 1 via ORAL
  Filled 2019-12-26 (×6): qty 1

## 2019-12-26 MED ORDER — METRONIDAZOLE IN NACL 5-0.79 MG/ML-% IV SOLN
500.0000 mg | Freq: Three times a day (TID) | INTRAVENOUS | Status: DC
  Administered 2019-12-26 – 2019-12-30 (×13): 500 mg via INTRAVENOUS
  Filled 2019-12-26 (×15): qty 100

## 2019-12-26 MED ORDER — ALBUMIN HUMAN 25 % IV SOLN
50.0000 g | Freq: Once | INTRAVENOUS | Status: AC
  Administered 2019-12-26: 50 g via INTRAVENOUS
  Filled 2019-12-26: qty 200

## 2019-12-26 NOTE — Consult Note (Signed)
Name: Katherine Goodwin MRN: [id number] DOB: 10-Mar-1961    ADMISSION DATE:  12/25/2019 CONSULTATION DATE:  12/26/2019  REFERRING MD :  Rufina Falco, NP  CHIEF COMPLAINT:  Hypotension  BRIEF PATIENT DESCRIPTION:  59 y.o female with Acute Decompensated Liver Disease, Cholelithiasis and gallbladder sludge.  On 7/8 while in ED she became hypotensive requiring low dose vasopressors.  Concern for developing septic shock due to questionable SBP vs. questionable pancreatitis.  GI is consulted.  SIGNIFICANT EVENTS  7/7: Presented to ED 7/7: To be admitted to [place], remains in ED due to bed availability issues 7/8: Hypotensive requiring low dose pressors; PCCM consulted  STUDIES:  7/7: US abdomen limited>>1. Cholelithiasis and gallbladder sludge without evidence of acutecholecystitis. 2. Fatty liver. 3. Markedly limited evaluation of the portal vein, as described Above. 7/8: MR Abdomen MRCP>> 1. Despite efforts by the technologist and patient, motion artifact is present on today's exam and could not be eliminated. This reduces exam sensitivity and specificity. 2. Diffuse hepatic steatosis. Mild prominence of the left hepatic lobe could be an early morphologic indicator of cirrhosis, but there is no current nodularity of the liver margin. 3. Trace peripancreatic edema although not disproportionate to the surrounding mesenteric edema and ascites, although correlation with lipase level is suggested. There is also subcutaneous edema along the flanks. 4. Cholelithiasis. Gallbladder wall thickening is nonspecific and could be from hypoproteinemia/hypoalbuminemia or inflammation. 5. No biliary dilatation or obvious filling defect along the common bile duct. 6. Borderline enlarged porta hepatis/peripancreatic lymph nodes. 7/8: CXR>>Heart size normal. Low lung volumes. Mild bilateral interstitial prominence. Mild interstitial edema and/or pneumonitis cannot be excluded. Tiny right pleural  effusion cannot be excluded. No pneumothorax. Old right rib fractures again noted. 7/8: 2D Echocardiogram>>  CULTURES: 7/8: Blood cultures x2>> 7/8: Urine>> 7/8: Strep pneumo urinary antigen>> 7/8: Legionella urinary antigen>>  ANTIBIOTICS: Cefepime x1 dose in ED 7/7 Ceftriaxone 7/8>> Flagyl 7/8>>  HISTORY OF PRESENT ILLNESS:   Katherine Goodwin is a 59 year old female with a past medical history significant for alcohol abuse, severe hepatic steatosis and recent imaging suggestive of portal hypertension with paraesophageal and proximal perigastric varices, hypertension, asthma, bipolar disorder who presented to [facility] ED on 12/25/2019 due to increasing lower extremity edema, jaundice, weakness, and melena.  She reports 1 week of progressive swelling of her lower extremities and abdomen, along with worsening yellowing of her skin and eyes.  She reports some associated shortness of breath and wheezing, lightheaded and dizziness.  She reports having frequent semisoft bowel movements occurring 5-6 times per day, and has seen blood in her stool which is varied in color from bright red to dark black.  She denies chest pain, palpitations, cough, sputum production, abdominal pain, nausea, vomiting, dysuria, fever, chills.  Upon presentation to the ED, initial vitals showed blood pressure 104/73, pulse 77, respiratory rate 18, temperature 98.5, 100% SPO2 on room air.  Initial work-up revealed AST 178, ALT 52, alkaline phosphatase 431, total bilirubin 23.9, sodium 123, potassium 3.0, bicarb 19, BUN 36, creatinine not reportable, albumin 2.0, total protein 6.3, WBC 21.6, hemoglobin 11.7, platelets 198, BNP 203, INR 1.2, ammonia 87, procalcitonin 1.72, high-sensitivity troponin 36.  RUQ ultrasound showed cholelithiasis and gallbladder sludge without evidence of acute cholecystitis.  Portal vein evaluation was limited due to increased echogenicity of the liver.  Per ED provider, patient did have blood per rectum with  positive FOBT.  She was given empiric IV cefepime, and C. difficile and GI pathogen panels are pending.  ED provider  discussed with on-call GI who recommended obtaining MRCP, correcting electrolyte abnormalities, and continued empiric antibiotics for now.  Hospitalist were consulted to admit for further evaluation and management. MR MRCP with diffuse hepatic steatosis, trace peripancreatic edema, mesenteric edema and ascites, and cholelithiasis.  While awaiting bed placement in the ED, patient became hypotensive requiring initiation of low-dose vasopressors.  PCCM was consulted to assist with management of hypotension.  Concern for possible developing septic shock in setting of questionable SBP vs. questionable Pancreatitis.  Of note patient was recently admitted from 12/12/19 to 12/15/2019 for hepatic encephalopathy felt secondary to alcohol related hepatic dysfunction.  CT abdomen/pelvis on 12/12/2018 showed progressive hepatomegaly and severe hepatic steatosis, portal hypertension, paraesophageal and proximal perigastric varices, small volume abdominal pelvic ascites.  Abdominal ultrasound on 12/13/2019 showed trace abdominal pelvic ascites not amenable to paracentesis.  GI was consulted who recommended to continue medical management.  She was discharged home on lactulose 10 g 3 times daily (to maintain 2-3 BMs per day), spironolactone 50 mg daily, and rifaximin twice daily.  Lasix was not initiated at that time due to hypokalemia and soft blood pressures.  Home health PT/OT was recommended.  Acute hepatitis panel was negative as well as further viral hepatitis work-up with EBV, CMV, varicella-zoster, hepatitis B, and hepatitis C studies.  PAST MEDICAL HISTORY :   has a past medical history of Asthma, Depression, GERD (gastroesophageal reflux disease), Hypertension, Kidney stones, Osteoarthrosis, unspecified whether generalized or localized, involving lower leg, Other bipolar disorders, Seizure ([place]),  Tobacco abuse, Trigger finger, and Wears dentures.  has a past surgical history that includes Foot surgery; Colonoscopy with propofol (N/A, 12/14/2018); Esophagogastroduodenoscopy (egd) with propofol (N/A, 12/14/2018); and polypectomy (N/A, 12/14/2018). Prior to Admission medications   Medication Sig Start Date End Date Taking? Authorizing Provider  diphenhydrAMINE (BENADRYL) 25 MG tablet Take 25 mg by mouth every 6 (six) hours as needed for itching or allergies.    Yes [provider]  ibuprofen (ADVIL,MOTRIN) 800 MG tablet Take 800 mg by mouth every 8 (eight) hours as needed.   Yes [provider]  lactulose (CHRONULAC) 10 GM/15ML solution Take 15 mLs (10 g total) by mouth 3 (three) times daily as needed (to have 2-3 loose BM a day). 12/15/19  Yes Barb Merino, MD  Multiple Vitamin (MULTIVITAMIN WITH MINERALS) TABS tablet Take 1 tablet by mouth daily. 12/16/19  Yes Barb Merino, MD  rifaximin (XIFAXAN) 550 MG TABS tablet Take 1 tablet (550 mg total) by mouth 2 (two) times daily. 12/15/19 01/14/20 Yes Ghimire, Dante Gang, MD  spironolactone (ALDACTONE) 50 MG tablet Take 1 tablet (50 mg total) by mouth daily. 12/16/19 01/15/20 Yes Barb Merino, MD   Allergies  Allergen Reactions  . Prozac [Fluoxetine Hcl] Other (See Comments)    Homicidal and suicidal thoughts  . Seroquel [Quetiapine Fumarate] Other (See Comments)    Homicidal and suicidal thoughts  . Sulfa Antibiotics Nausea And Vomiting    FAMILY HISTORY:  family history includes Heart attack (age of onset: 62) in her father; Heart disease (age of onset: 33) in her father; Hypertension in her mother; Throat cancer in her brother. SOCIAL HISTORY:  reports that she has been smoking cigarettes. She has a 9.75 pack-year smoking history. She has never used smokeless tobacco. She reports current alcohol use of about 7.0 standard drinks of alcohol per week. She reports current drug use. Drug: Marijuana.   COVID-19 DISASTER  DECLARATION:  FULL CONTACT PHYSICAL EXAMINATION WAS NOT POSSIBLE DUE TO TREATMENT OF COVID-19 AND  CONSERVATION OF PERSONAL PROTECTIVE EQUIPMENT, LIMITED EXAM FINDINGS INCLUDE-  Patient assessed or the symptoms described in the history of present illness.  In the context of the Global COVID-19 pandemic, which necessitated consideration that the patient might be at risk for infection with the SARS-CoV-2 virus that causes COVID-19, Institutional protocols and algorithms that pertain to the evaluation of patients at risk for COVID-19 are in a state of rapid change based on information released by regulatory bodies including the CDC and federal and state organizations. These policies and algorithms were followed during the patient's care while in hospital.  REVIEW OF SYSTEMS:  Positives in BOLD Constitutional: Negative for fever, chills, weight loss, +malaise/fatigue and diaphoresis.  HENT: Negative for hearing loss, ear pain, nosebleeds, congestion, sore throat, neck pain, tinnitus and ear discharge.   Eyes: Negative for blurred vision, double vision, photophobia, pain, discharge and redness.  Respiratory: Negative for cough, hemoptysis, sputum production, shortness of breath, wheezing and stridor.   Cardiovascular: Negative for chest pain, palpitations, orthopnea, claudication, +leg swelling and PND.  Gastrointestinal: Negative for heartburn, nausea, vomiting, abdominal pain, diarrhea, constipation, blood in stool and melena.  Genitourinary: Negative for dysuria, urgency, frequency, hematuria and flank pain.  Musculoskeletal: Negative for myalgias, back pain, joint pain and falls.  Skin: Negative for itching and rash.  Neurological: Negative for dizziness, tingling, tremors, sensory change, speech change, focal weakness, seizures, loss of consciousness, weakness and headaches.  Endo/Heme/Allergies: Negative for environmental allergies and polydipsia. Does not bruise/bleed easily.  SUBJECTIVE:   Pt reports generalized weakness and malaise, bilateral LE edema, and yellowing of her skin Denies chest pain, SOB, cough, sputum, abdominal pain, N/V/D, dysuria, fever/chills On room air 2 mcg Levophed currently infusing  VITAL SIGNS: Temp:  [98.5 F (36.9 C)] 98.5 F (36.9 C) (07/07 1806) Pulse Rate:  [68-78] 73 (07/08 0500) Resp:  [12-18] 13 (07/08 0500) BP: (74-118)/(47-99) 102/58 (07/08 0500) SpO2:  [92 %-100 %] 96 % (07/08 0500) Weight:  [74.8 kg] 74.8 kg (07/07 1813)  PHYSICAL EXAMINATION: General: Acute on chronically ill-appearing female, sitting in bed, on room air, no acute distress Neuro: Awake, alert and oriented x3, follows commands, no focal deficits, speech clear HEENT: Atraumatic, normocephalic, neck supple, no JVD Cardiovascular: Regular rate and rhythm, S1-S2, no murmurs, rubs, gallops Lungs: Clear to auscultation with noted fine crackles to bilateral lower lung fields, no wheezing, even, nonlabored, normal effort Abdomen: Obese, distended, soft, nontender, no guarding or rebound tenderness, bowel sounds positive x4 Musculoskeletal: Generalized weakness, no deformities, 2+ bilateral pitting edema to lower extremities Skin: Jaundice, warm and dry.  No obvious rashes, lesions, ulcerations  Recent Labs  Lab 12/25/19 1814  NA 123*  K 3.0*  CL 91*  CO2 19*  BUN 36*  CREATININE UNABLE TO REPORT DUE TO ICTERUS  GLUCOSE 115*   Recent Labs  Lab 12/25/19 1814 12/26/19 0449  HGB 11.7* 10.2*  HCT 31.8* 28.4*  WBC 21.6* 18.8*  PLT 198 144*   US Abdomen Limited RUQ  Result Date: 12/25/2019 CLINICAL DATA:  Right upper quadrant pain. EXAM: ULTRASOUND ABDOMEN LIMITED RIGHT UPPER QUADRANT COMPARISON:  None. FINDINGS: Gallbladder: Echogenic sludge and subcentimeter shadowing echogenic gallstones are seen within the gallbladder lumen. Gallbladder wall thickening is seen (5.3 mm). No sonographic Murphy sign noted by sonographer. Common bile duct: Diameter: 4.7 mm (limited  in visualization) Liver: No focal lesion identified. Diffusely increased echogenicity of the liver parenchyma is seen. Evaluation of the portal vein is markedly limited secondary to the increased echogenicity of the  liver. As a result, portal vein patency and direction of flow cannot be confirmed. Other: None. IMPRESSION: 1. Cholelithiasis and gallbladder sludge without evidence of acute cholecystitis. 2. Fatty liver. 3. Markedly limited evaluation of the portal vein, as described above. Electronically Signed   By: Virgina Norfolk M.D.   On: 12/25/2019 23:03    ASSESSMENT / PLAN:  Hypotension, concern for developing septic shock  -Continuous cardiac monitoring -Maintain MAP greater than 65 -Cautious IV fluids given mild pulmonary edema noted on chest x-ray -Vasopressors as needed to maintain MAP goal -Will give albumin -Troponin is negative  -Trend lactic acid -Check 2D echocardiogram  Leukocytosis  Questionable SBP vs. Questionable Pancreatitis -Monitor fever curve -Trend WBCs and procalcitonin -Follow cultures as above -Perform Sepsis workup -Continue ceftriaxone, will add Flagyl for now -MR MRCP 12/26/19 with diffuse hepatic steatosis, trace peripancreatic edema, mesenteric edema and ascites, cholelithiasis -To undergo US guided Paracentesis -Trend Lipase & Amylase  Decompensated Liver Disease with Hyperbilirubinemia/Transaminitis Hyperammonemia -GI consulted, appreciate input -MR MRCP 12/26/19 with diffuse hepatic steatosis, trace peripancreatic edema, mesenteric edema and ascites, cholelithiasis -To undergo US guided Paracentesis -Holding home Lactulose & Rifaximin due to excessive BM's; will need to restart due to high risk of Hepatic Encephalopathy -Hold Spironolactone & Lasix due to Hypotension  Melena (no further episodes since arrival to ED) -FOBT positive in ED -GI consulted, appreciate input -Place on Protonix BID -Monitor for S/Sx of bleeding -Trend CBC (H&H  q6h) -SCD's for VTE Prophylaxis  -Transfuse for Hgb <8  AKI Hypotonic Hypervolemic Hyponatremia due to Decompensated Liver Disease Hypokalemia -Monitor I&O's / urinary output -Follow BMP -Ensure adequate renal perfusion -Avoid nephrotoxic agents as able -Replace electrolytes as indicated -Fluid restriction -Unable to diurese due to Hypotension           BEST PRACTICES DISPOSITION: Stepdown GOALS OF CARE: Full Code VTE PROPHYLAXIS: SCD's CONSULTS: Hospitalist (primary service), GI UPDATES: Updated pt at bedside 12/26/19   Darel Hong, AGACNP-BC [facility] Pager: [phone]  12/26/2019, 5:10 AM

## 2019-12-26 NOTE — Progress Notes (Signed)
*  PRELIMINARY RESULTS* Echocardiogram 2D Echocardiogram has been performed.  Sherrie Sport 12/26/2019, 1:27 PM

## 2019-12-26 NOTE — ED Notes (Signed)
Attempted to call report, RN not available, RN to call this RN back when available

## 2019-12-26 NOTE — Consult Note (Signed)
Lucilla Lame, MD [facility]  [address], [place], [place] [address] Phone: [phone] Fax : [phone]  Consultation  Referring Provider:     Dr. Manuella Ghazi Primary Care Physician:  Glean Hess, MD Primary Gastroenterologist:  Dr. Allen Norris         Reason for Consultation:     Decompensated liver disease  Date of Admission:  12/25/2019 Date of Consultation:  12/26/2019         HPI:   Katherine Goodwin is a 59 y.o. female who was in the hospital for abnormal liver enzymes back in the 25th of last month.  The patient had reported consuming 4 shots of hard liquor a day for about 15 to 20 years.  The patient now reports that she has stopped drinking since being discharged from the hospital.  Patient at that time was found to have hepatomegaly with severe hepatic steatosis.  The patient was admitted with some edema and jaundice and was reported by her husband to have been weak and falling multiple times. The patient was in the hospital at the end of June for alcoholic hepatitis.  The patient was discharged on 6/27.  The patient's bilirubin was 23.9 on admission that went down to 22.7.  She also had ultrasound with cholelithiasis and gallbladder sludge without evidence of acute cholecystitis.  The patient has reported that she has been having GI bleeding with dark stools for a few weeks.  The patient's hemoglobin on June 24 was 10.8 which went down to 9.1 on June 25 and her admission hemoglobin was 11.7 that went down to 10.2 that is now 9.3.  Past Medical History:  Diagnosis Date  . Asthma   . Depression   . GERD (gastroesophageal reflux disease)   . Hypertension   . Kidney stones   . Osteoarthrosis, unspecified whether generalized or localized, involving lower leg    leg  . Other bipolar disorders   . Seizure ([place])    none since age 55  . Tobacco abuse   . Trigger finger   . Wears dentures    partial upper    Past Surgical History:  Procedure Laterality Date  . COLONOSCOPY WITH  PROPOFOL N/A 12/14/2018   Procedure: COLONOSCOPY WITH PROPOFOL;  Surgeon: Lucilla Lame, MD;  Location: [place];  Service: Endoscopy;  Laterality: N/A;  . ESOPHAGOGASTRODUODENOSCOPY (EGD) WITH PROPOFOL N/A 12/14/2018   Procedure: ESOPHAGOGASTRODUODENOSCOPY (EGD) WITH BIOPSY;  Surgeon: Lucilla Lame, MD;  Location: [place];  Service: Endoscopy;  Laterality: N/A;  . FOOT SURGERY     right foot  . POLYPECTOMY N/A 12/14/2018   Procedure: POLYPECTOMY;  Surgeon: Lucilla Lame, MD;  Location: [place];  Service: Endoscopy;  Laterality: N/A;    Prior to Admission medications   Medication Sig Start Date End Date Taking? Authorizing Provider  diphenhydrAMINE (BENADRYL) 25 MG tablet Take 25 mg by mouth every 6 (six) hours as needed for itching or allergies.    Yes [provider]  ibuprofen (ADVIL,MOTRIN) 800 MG tablet Take 800 mg by mouth every 8 (eight) hours as needed.   Yes [provider]  lactulose (CHRONULAC) 10 GM/15ML solution Take 15 mLs (10 g total) by mouth 3 (three) times daily as needed (to have 2-3 loose BM a day). 12/15/19  Yes Barb Merino, MD  Multiple Vitamin (MULTIVITAMIN WITH MINERALS) TABS tablet Take 1 tablet by mouth daily. 12/16/19  Yes Barb Merino, MD  rifaximin (XIFAXAN) 550 MG TABS tablet Take 1  tablet (550 mg total) by mouth 2 (two) times daily. 12/15/19 01/14/20 Yes Ghimire, Dante Gang, MD  spironolactone (ALDACTONE) 50 MG tablet Take 1 tablet (50 mg total) by mouth daily. 12/16/19 01/15/20 Yes Barb Merino, MD    Family History  Problem Relation Age of Onset  . Heart disease Father 65       CABG x 3  . Heart attack Father 23       MI  . Hypertension Mother   . Throat cancer Brother      Social History   Tobacco Use  . Smoking status: Current Every Day Smoker    Packs/day: 0.25    Years: 39.00    Pack years: 9.75    Types: Cigarettes  . Smokeless tobacco: Never Used  Vaping Use  . Vaping Use: Never used  Substance  Use Topics  . Alcohol use: Yes    Alcohol/week: 7.0 standard drinks    Types: 3 Cans of beer, 4 Shots of liquor per week    Comment: Pt states 3-4 shots a day  . Drug use: Yes    Types: Marijuana    Comment: Smokes Marijuana 2-3 times a month.     Allergies as of 12/25/2019 - Review Complete 12/25/2019  Allergen Reaction Noted  . Prozac [fluoxetine hcl] Other (See Comments) 03/04/2017  . Seroquel [quetiapine fumarate] Other (See Comments) 03/04/2017  . Sulfa antibiotics Nausea And Vomiting 12/10/2018    Review of Systems:    All systems reviewed and negative except where noted in HPI.   Physical Exam:  Vital signs in last 24 hours: Temp:  [98.5 F (36.9 C)] 98.5 F (36.9 C) (07/07 1806) Pulse Rate:  [68-78] 68 (07/08 1750) Resp:  [12-18] 15 (07/08 1750) BP: (74-121)/(47-99) 98/65 (07/08 1750) SpO2:  [92 %-100 %] 98 % (07/08 1750) Weight:  [74.8 kg] 74.8 kg (07/07 1813)   General:   Pleasant, cooperative in NAD Head:  Normocephalic and atraumatic. Eyes:   Positive icterus.   Conjunctiva yellow. PERRLA. Ears:  Normal auditory acuity. Neck:  Supple; no masses or thyroidomegaly Lungs: Respirations even and unlabored. Lungs clear to auscultation bilaterally.   No wheezes, crackles, or rhonchi.  Heart:  Regular rate and rhythm;  Without murmur, clicks, rubs or gallops Abdomen:  Soft, nondistended, nontender. Normal bowel sounds. No appreciable masses or hepatomegaly.  No rebound or guarding.  Rectal:  Not performed. Msk:  Symmetrical without gross deformities.    Extremities:  Without edema, cyanosis or clubbing. Neurologic:  Alert and oriented x3;  grossly normal neurologically.  Positive asterixis. Skin:  Intact without significant lesions or rashes. Cervical Nodes:  No significant cervical adenopathy. Psych:  Alert and cooperative. Normal affect.  LAB RESULTS: Recent Labs    12/25/19 1814 12/26/19 0449 12/26/19 1158  WBC 21.6* 18.8* 16.2*  HGB 11.7* 10.2* 9.2*   9.1*  HCT 31.8* 28.4* 24.7*  24.9*  PLT 198 144* 144*   BMET Recent Labs    12/25/19 1814 12/26/19 0449 12/26/19 1158  NA 123* 121* 124*  K 3.0* 3.2* 3.5  CL 91* 94* 94*  CO2 19* 19* 21*  GLUCOSE 115* 135* 65*  BUN 36* 39* 35*  CREATININE UNABLE TO REPORT DUE TO ICTERUS 1.41* 1.26*  CALCIUM 8.0* 7.2* 7.8*   LFT Recent Labs    12/26/19 0449 12/26/19 0449 12/26/19 1158  PROT 4.9*   < > 5.7*  ALBUMIN 1.5*   < > 2.9*  AST 145*   < > 135*  ALT  41   < > 37  ALKPHOS 322*   < > 285*  BILITOT 18.5*   < > 22.7*  BILIDIR 11.5*  --   --   IBILI 7.0*  --   --    < > = values in this interval not displayed.   PT/INR Recent Labs    12/25/19 1814 12/26/19 0449  LABPROT 15.0 15.5*  INR 1.2 1.3*    STUDIES: MR ABDOMEN MRCP WO CONTRAST  Result Date: 12/26/2019 CLINICAL DATA:  Cirrhosis, leukocytosis.  Jaundice. EXAM: MRI ABDOMEN WITHOUT CONTRAST  (INCLUDING MRCP) TECHNIQUE: Multiplanar multisequence MR imaging of the abdomen was performed. Heavily T2-weighted images of the biliary and pancreatic ducts were obtained, and three-dimensional MRCP images were rendered by post processing. COMPARISON:  Multiple exams, including abdominal ultrasound 12/25/2019 and CT abdomen from 12/12/2019 FINDINGS: Despite efforts by the technologist and patient, motion artifact is present on today's exam and could not be eliminated. This reduces exam sensitivity and specificity. Lower chest: Unremarkable Hepatobiliary: Gallbladder wall thickening is nonspecific. No biliary dilatation. Diffuse hepatic steatosis. No obvious filling defect along the common bile duct or common hepatic duct. Granular signal dependently in the gallbladder suggests small gallstones and sludge. No significant focal parenchymal lesion is identified in the liver. Mild prominence of the left hepatic lobe could be an early morphologic indicator of cirrhosis, but there is no current nodularity of the liver margin. Pancreas: Trace  peripancreatic edema although not disproportionate to the surrounding mesenteric edema and ascites, although correlation with lipase level is suggested. No dorsal pancreatic duct dilatation. Spleen:  Unremarkable Adrenals/Urinary Tract:  Unremarkable Stomach/Bowel: Unremarkable Vascular/Lymphatic: Aortoiliac atherosclerotic vascular disease. Borderline enlarged porta hepatis/peripancreatic lymph nodes up to 1.0 cm in diameter on image 22/8. Other: Notable upper abdominal ascites. Mesenteric edema and edema along both flanks. Musculoskeletal: Unremarkable IMPRESSION: 1. Despite efforts by the technologist and patient, motion artifact is present on today's exam and could not be eliminated. This reduces exam sensitivity and specificity. 2. Diffuse hepatic steatosis. Mild prominence of the left hepatic lobe could be an early morphologic indicator of cirrhosis, but there is no current nodularity of the liver margin. 3. Trace peripancreatic edema although not disproportionate to the surrounding mesenteric edema and ascites, although correlation with lipase level is suggested. There is also subcutaneous edema along the flanks. 4. Cholelithiasis. Gallbladder wall thickening is nonspecific and could be from hypoproteinemia/hypoalbuminemia or inflammation. 5. No biliary dilatation or obvious filling defect along the common bile duct. 6. Borderline enlarged porta hepatis/peripancreatic lymph nodes. Electronically Signed   By: Van Clines M.D.   On: 12/26/2019 05:35   MR 3D Recon At Scanner  Result Date: 12/26/2019 CLINICAL DATA:  Cirrhosis, leukocytosis.  Jaundice. EXAM: MRI ABDOMEN WITHOUT CONTRAST  (INCLUDING MRCP) TECHNIQUE: Multiplanar multisequence MR imaging of the abdomen was performed. Heavily T2-weighted images of the biliary and pancreatic ducts were obtained, and three-dimensional MRCP images were rendered by post processing. COMPARISON:  Multiple exams, including abdominal ultrasound 12/25/2019 and CT  abdomen from 12/12/2019 FINDINGS: Despite efforts by the technologist and patient, motion artifact is present on today's exam and could not be eliminated. This reduces exam sensitivity and specificity. Lower chest: Unremarkable Hepatobiliary: Gallbladder wall thickening is nonspecific. No biliary dilatation. Diffuse hepatic steatosis. No obvious filling defect along the common bile duct or common hepatic duct. Granular signal dependently in the gallbladder suggests small gallstones and sludge. No significant focal parenchymal lesion is identified in the liver. Mild prominence of the left hepatic lobe could be an  early morphologic indicator of cirrhosis, but there is no current nodularity of the liver margin. Pancreas: Trace peripancreatic edema although not disproportionate to the surrounding mesenteric edema and ascites, although correlation with lipase level is suggested. No dorsal pancreatic duct dilatation. Spleen:  Unremarkable Adrenals/Urinary Tract:  Unremarkable Stomach/Bowel: Unremarkable Vascular/Lymphatic: Aortoiliac atherosclerotic vascular disease. Borderline enlarged porta hepatis/peripancreatic lymph nodes up to 1.0 cm in diameter on image 22/8. Other: Notable upper abdominal ascites. Mesenteric edema and edema along both flanks. Musculoskeletal: Unremarkable IMPRESSION: 1. Despite efforts by the technologist and patient, motion artifact is present on today's exam and could not be eliminated. This reduces exam sensitivity and specificity. 2. Diffuse hepatic steatosis. Mild prominence of the left hepatic lobe could be an early morphologic indicator of cirrhosis, but there is no current nodularity of the liver margin. 3. Trace peripancreatic edema although not disproportionate to the surrounding mesenteric edema and ascites, although correlation with lipase level is suggested. There is also subcutaneous edema along the flanks. 4. Cholelithiasis. Gallbladder wall thickening is nonspecific and could be  from hypoproteinemia/hypoalbuminemia or inflammation. 5. No biliary dilatation or obvious filling defect along the common bile duct. 6. Borderline enlarged porta hepatis/peripancreatic lymph nodes. Electronically Signed   By: Van Clines M.D.   On: 12/26/2019 05:35   DG Chest Port 1 View  Result Date: 12/26/2019 CLINICAL DATA:  Shortness of breath. EXAM: PORTABLE CHEST 1 VIEW COMPARISON:  11/26/2018. FINDINGS: Heart size normal. Low lung volumes. Mild bilateral interstitial prominence. Mild interstitial edema and/or pneumonitis cannot be excluded. Tiny right pleural effusion cannot be excluded. No pneumothorax. Old right rib fractures again noted. IMPRESSION: Low lung volumes. Mild bilateral interstitial prominence. Mild interstitial edema and/or pneumonitis cannot be excluded. Tiny right pleural effusion cannot be excluded. Electronically Signed   By: Marcello Moores  Register   On: 12/26/2019 05:25   US Abdomen Limited RUQ  Result Date: 12/25/2019 CLINICAL DATA:  Right upper quadrant pain. EXAM: ULTRASOUND ABDOMEN LIMITED RIGHT UPPER QUADRANT COMPARISON:  None. FINDINGS: Gallbladder: Echogenic sludge and subcentimeter shadowing echogenic gallstones are seen within the gallbladder lumen. Gallbladder wall thickening is seen (5.3 mm). No sonographic Murphy sign noted by sonographer. Common bile duct: Diameter: 4.7 mm (limited in visualization) Liver: No focal lesion identified. Diffusely increased echogenicity of the liver parenchyma is seen. Evaluation of the portal vein is markedly limited secondary to the increased echogenicity of the liver. As a result, portal vein patency and direction of flow cannot be confirmed. Other: None. IMPRESSION: 1. Cholelithiasis and gallbladder sludge without evidence of acute cholecystitis. 2. Fatty liver. 3. Markedly limited evaluation of the portal vein, as described above. Electronically Signed   By: Virgina Norfolk M.D.   On: 12/25/2019 23:03      Impression / Plan:    Assessment: Principal Problem:   Decompensated liver disease ([place]) Active Problems:   Blood in stool   Alcoholic steatohepatitis   Hypokalemia   Hyperbilirubinemia   Elevated LFTs   Hyponatremia   Portal hypertension ([facility])   Katherine Goodwin is a 59 y.o. y/o female with decompensated liver disease with a recent admission a little more than a week ago for alcoholic hepatitis.  The patient has reported that she is having some GI bleeding and has had a drop in her hemoglobin.  The patient has a slight flapping tremor.  Plan:  The patient has decompensated liver disease with a recent bout of alcoholic hepatitis.  The patient is already started on antibiotics due to her GI bleed and cirrhosis.  The patient is also on Xifaxan for her hepatic encephalopathy and I agree with this and would continue it.  As for her liver disease the patient should have conservative management and nutritional support.  The patient should continue to avoid any further alcohol use.  If the patient continues to have a drop in her hemoglobin and any signs of GI bleeding then an upper endoscopy may be recommended.  The patient had a upper endoscopy 1 year ago without any sign of varices.  The patient has been explained the plan and agrees with it.  Thank you for involving me in the care of this patient.      LOS: 0 days   Lucilla Lame, MD  12/26/2019, 5:59 PM Pager [phone] 7am-5pm  Check AMION for 5pm -7am coverage and on weekends   Note: This dictation was prepared with Dragon dictation along with smaller phrase technology. Any transcriptional errors that result from this process are unintentional.

## 2019-12-26 NOTE — ED Notes (Signed)
NP messaged regarding pt BP

## 2019-12-26 NOTE — ED Notes (Signed)
Report given to ICU RN, RN states that ICU charge nurse will let this RN know when pt may go to floor. ED charge updated.

## 2019-12-26 NOTE — ED Notes (Signed)
Dr. Manuella Ghazi informed of critical bilirubin

## 2019-12-26 NOTE — Progress Notes (Signed)
[facility] NAME: Katherine Goodwin    MR#:  [id number]  DATE OF BIRTH:  May 20, 1961  SUBJECTIVE:  CHIEF COMPLAINT:   Chief Complaint  Patient presents with  . Leg Swelling  Very tired and sleepy, confused REVIEW OF SYSTEMS:  ROS unable to assess due to mental status DRUG ALLERGIES:   Allergies  Allergen Reactions  . Prozac [Fluoxetine Hcl] Other (See Comments)    Homicidal and suicidal thoughts  . Seroquel [Quetiapine Fumarate] Other (See Comments)    Homicidal and suicidal thoughts  . Sulfa Antibiotics Nausea And Vomiting   VITALS:  Blood pressure 103/72, pulse 74, temperature 98.5 F (36.9 C), temperature source Oral, resp. rate 15, height 5\' 5"  (1.651 m), weight 74.8 kg, SpO2 98 %. PHYSICAL EXAMINATION:  Physical Exam HENT:     Head: Normocephalic and atraumatic.  Eyes:     Conjunctiva/sclera: Conjunctivae normal.     Pupils: Pupils are equal, round, and reactive to light.  Neck:     Thyroid: No thyromegaly.     Trachea: No tracheal deviation.  Cardiovascular:     Rate and Rhythm: Normal rate and regular rhythm.     Heart sounds: Normal heart sounds.  Pulmonary:     Effort: Pulmonary effort is normal. No respiratory distress.     Breath sounds: Normal breath sounds. No wheezing.  Chest:     Chest wall: No tenderness.  Abdominal:     General: Bowel sounds are normal. There is distension.     Palpations: Abdomen is soft. There is fluid wave.     Tenderness: There is generalized abdominal tenderness.  Musculoskeletal:        General: Normal range of motion.     Cervical back: Normal range of motion and neck supple.  Skin:    General: Skin is warm and dry.     Findings: No rash.  Neurological:     Mental Status: She is confused.     Cranial Nerves: No cranial nerve deficit.    LABORATORY PANEL:  Female CBC Recent Labs  Lab 12/26/19 1158  WBC 16.2*  HGB 9.2*  9.1*  HCT 24.7*  24.9*  PLT 144*    ------------------------------------------------------------------------------------------------------------------ Chemistries  Recent Labs  Lab 12/26/19 0449 12/26/19 0449 12/26/19 1158  NA 121*   < > 124*  K 3.2*   < > 3.5  CL 94*   < > 94*  CO2 19*   < > 21*  GLUCOSE 135*   < > 65*  BUN 39*   < > 35*  CREATININE 1.41*   < > 1.26*  CALCIUM 7.2*   < > 7.8*  MG 2.6*  --   --   AST 145*   < > 135*  ALT 41   < > 37  ALKPHOS 322*   < > 285*  BILITOT 18.5*   < > 22.7*   < > = values in this interval not displayed.   RADIOLOGY:  MR ABDOMEN MRCP WO CONTRAST  Result Date: 12/26/2019 CLINICAL DATA:  Cirrhosis, leukocytosis.  Jaundice. EXAM: MRI ABDOMEN WITHOUT CONTRAST  (INCLUDING MRCP) TECHNIQUE: Multiplanar multisequence MR imaging of the abdomen was performed. Heavily T2-weighted images of the biliary and pancreatic ducts were obtained, and three-dimensional MRCP images were rendered by post processing. COMPARISON:  Multiple exams, including abdominal ultrasound 12/25/2019 and CT abdomen from 12/12/2019 FINDINGS: Despite efforts by the technologist and patient, motion artifact is present  on today's exam and could not be eliminated. This reduces exam sensitivity and specificity. Lower chest: Unremarkable Hepatobiliary: Gallbladder wall thickening is nonspecific. No biliary dilatation. Diffuse hepatic steatosis. No obvious filling defect along the common bile duct or common hepatic duct. Granular signal dependently in the gallbladder suggests small gallstones and sludge. No significant focal parenchymal lesion is identified in the liver. Mild prominence of the left hepatic lobe could be an early morphologic indicator of cirrhosis, but there is no current nodularity of the liver margin. Pancreas: Trace peripancreatic edema although not disproportionate to the surrounding mesenteric edema and ascites, although correlation with lipase level is suggested. No dorsal pancreatic duct dilatation.  Spleen:  Unremarkable Adrenals/Urinary Tract:  Unremarkable Stomach/Bowel: Unremarkable Vascular/Lymphatic: Aortoiliac atherosclerotic vascular disease. Borderline enlarged porta hepatis/peripancreatic lymph nodes up to 1.0 cm in diameter on image 22/8. Other: Notable upper abdominal ascites. Mesenteric edema and edema along both flanks. Musculoskeletal: Unremarkable IMPRESSION: 1. Despite efforts by the technologist and patient, motion artifact is present on today's exam and could not be eliminated. This reduces exam sensitivity and specificity. 2. Diffuse hepatic steatosis. Mild prominence of the left hepatic lobe could be an early morphologic indicator of cirrhosis, but there is no current nodularity of the liver margin. 3. Trace peripancreatic edema although not disproportionate to the surrounding mesenteric edema and ascites, although correlation with lipase level is suggested. There is also subcutaneous edema along the flanks. 4. Cholelithiasis. Gallbladder wall thickening is nonspecific and could be from hypoproteinemia/hypoalbuminemia or inflammation. 5. No biliary dilatation or obvious filling defect along the common bile duct. 6. Borderline enlarged porta hepatis/peripancreatic lymph nodes. Electronically Signed   By: Van Clines M.D.   On: 12/26/2019 05:35   MR 3D Recon At Scanner  Result Date: 12/26/2019 CLINICAL DATA:  Cirrhosis, leukocytosis.  Jaundice. EXAM: MRI ABDOMEN WITHOUT CONTRAST  (INCLUDING MRCP) TECHNIQUE: Multiplanar multisequence MR imaging of the abdomen was performed. Heavily T2-weighted images of the biliary and pancreatic ducts were obtained, and three-dimensional MRCP images were rendered by post processing. COMPARISON:  Multiple exams, including abdominal ultrasound 12/25/2019 and CT abdomen from 12/12/2019 FINDINGS: Despite efforts by the technologist and patient, motion artifact is present on today's exam and could not be eliminated. This reduces exam sensitivity and  specificity. Lower chest: Unremarkable Hepatobiliary: Gallbladder wall thickening is nonspecific. No biliary dilatation. Diffuse hepatic steatosis. No obvious filling defect along the common bile duct or common hepatic duct. Granular signal dependently in the gallbladder suggests small gallstones and sludge. No significant focal parenchymal lesion is identified in the liver. Mild prominence of the left hepatic lobe could be an early morphologic indicator of cirrhosis, but there is no current nodularity of the liver margin. Pancreas: Trace peripancreatic edema although not disproportionate to the surrounding mesenteric edema and ascites, although correlation with lipase level is suggested. No dorsal pancreatic duct dilatation. Spleen:  Unremarkable Adrenals/Urinary Tract:  Unremarkable Stomach/Bowel: Unremarkable Vascular/Lymphatic: Aortoiliac atherosclerotic vascular disease. Borderline enlarged porta hepatis/peripancreatic lymph nodes up to 1.0 cm in diameter on image 22/8. Other: Notable upper abdominal ascites. Mesenteric edema and edema along both flanks. Musculoskeletal: Unremarkable IMPRESSION: 1. Despite efforts by the technologist and patient, motion artifact is present on today's exam and could not be eliminated. This reduces exam sensitivity and specificity. 2. Diffuse hepatic steatosis. Mild prominence of the left hepatic lobe could be an early morphologic indicator of cirrhosis, but there is no current nodularity of the liver margin. 3. Trace peripancreatic edema although not disproportionate to the surrounding mesenteric edema and ascites,  although correlation with lipase level is suggested. There is also subcutaneous edema along the flanks. 4. Cholelithiasis. Gallbladder wall thickening is nonspecific and could be from hypoproteinemia/hypoalbuminemia or inflammation. 5. No biliary dilatation or obvious filling defect along the common bile duct. 6. Borderline enlarged porta hepatis/peripancreatic lymph  nodes. Electronically Signed   By: Van Clines M.D.   On: 12/26/2019 05:35   DG Chest Port 1 View  Result Date: 12/26/2019 CLINICAL DATA:  Shortness of breath. EXAM: PORTABLE CHEST 1 VIEW COMPARISON:  11/26/2018. FINDINGS: Heart size normal. Low lung volumes. Mild bilateral interstitial prominence. Mild interstitial edema and/or pneumonitis cannot be excluded. Tiny right pleural effusion cannot be excluded. No pneumothorax. Old right rib fractures again noted. IMPRESSION: Low lung volumes. Mild bilateral interstitial prominence. Mild interstitial edema and/or pneumonitis cannot be excluded. Tiny right pleural effusion cannot be excluded. Electronically Signed   By: Marcello Moores  Register   On: 12/26/2019 05:25   US Abdomen Limited RUQ  Result Date: 12/25/2019 CLINICAL DATA:  Right upper quadrant pain. EXAM: ULTRASOUND ABDOMEN LIMITED RIGHT UPPER QUADRANT COMPARISON:  None. FINDINGS: Gallbladder: Echogenic sludge and subcentimeter shadowing echogenic gallstones are seen within the gallbladder lumen. Gallbladder wall thickening is seen (5.3 mm). No sonographic Murphy sign noted by sonographer. Common bile duct: Diameter: 4.7 mm (limited in visualization) Liver: No focal lesion identified. Diffusely increased echogenicity of the liver parenchyma is seen. Evaluation of the portal vein is markedly limited secondary to the increased echogenicity of the liver. As a result, portal vein patency and direction of flow cannot be confirmed. Other: None. IMPRESSION: 1. Cholelithiasis and gallbladder sludge without evidence of acute cholecystitis. 2. Fatty liver. 3. Markedly limited evaluation of the portal vein, as described above. Electronically Signed   By: Virgina Norfolk M.D.   On: 12/25/2019 23:03   ASSESSMENT AND PLAN:  59 year old female with a known history of ongoing alcohol abuse, severe hepatic steatosis, hypertension, asthma, bipolar disorder admitted for decompensated liver disease with bright red blood  per rectum  Decompensated liver disease with hyperbilirubinemia/transaminitis Right upper quadrant ultrasound showing cholelithiasis and gallbladder sludge without evidence of acute cholecystitis, CBD 4.7 mm, fatty liver MRI not showing any other acute pathology GI consult pending Total bilirubin 23.9->22.7  Hypotension: Present on admission requiring pressors and stepdown monitoring Wean Levophed as able with a MAP more than 65 Echo pending, US guided paracentesis to evaluate for possible SBP Empiric Rocephin plus Flagyl  Bright red blood per rectum GI consult, IV Protonix 40 mg twice daily for now Hemoglobin 9.1 (10.2 on admission) Monitor H&H every 6 hours FOBT positive in ED Transfuse for hemoglobin less than 8  Acute kidney injury Likely ATN from hypotension Improving with hydration, creatinine 1.23  Hepatic encephalopathy Monitor, ammonia 87 Start lactulose  Overall prognosis poor.  Palliative care consultation for goals of care discussion  Covid test was not checked I have ordered  Body mass index is 27.46 kg/m.      Status is: Inpatient  Remains inpatient appropriate because:Inpatient level of care appropriate due to severity of illness   Dispo: The patient is from: Home              Anticipated d/c is to: Home              Anticipated d/c date is: 3 days              Patient currently is not medically stable to d/c.    DVT prophylaxis:  SCDs Start: 12/26/19 0053     Family Communication: Discussed with patient   All the records are reviewed and case discussed with Care Management/Social Worker. Management plans discussed with the patient, nursing and they are in agreement.  CODE STATUS: Full Code  TOTAL TIME TAKING CARE OF THIS PATIENT: 35 minutes.   More than 50% of the time was spent in counseling/coordination of care: YES  POSSIBLE D/C IN 2-3 DAYS, DEPENDING ON CLINICAL CONDITION.   Max Sane M.D on 12/26/2019 at 5:31  PM  [facility]   CC: Primary care physician; Glean Hess, MD  Note: This dictation was prepared with Dragon dictation along with smaller phrase technology. Any transcriptional errors that result from this process are unintentional.

## 2019-12-26 NOTE — Progress Notes (Signed)
Report called to [facility]

## 2019-12-26 NOTE — ED Notes (Signed)
Dr. Francine Graven paged for critical bilirubin

## 2019-12-26 NOTE — ED Notes (Signed)
Per Dr. Manuella Ghazi, titrate levophed down to off. Levophed titrated to 3mcg/min, will continue to monitor.

## 2019-12-26 NOTE — ED Notes (Signed)
BP 97/61 MAP 74. Levophed paused due to parameters of MAP > 65. Will continue to monitor

## 2019-12-27 ENCOUNTER — Encounter
Admission: EM | Disposition: A | Discharge status: Home Health | Payer: Self-pay | Source: Home / Self Care | Attending: Internal Medicine

## 2019-12-27 ENCOUNTER — Inpatient Hospital Stay: Payer: Medicaid Other | Admitting: Certified Registered Nurse Anesthetist

## 2019-12-27 ENCOUNTER — Inpatient Hospital Stay: Payer: Medicaid Other

## 2019-12-27 ENCOUNTER — Encounter: Payer: Self-pay | Admitting: Internal Medicine

## 2019-12-27 DIAGNOSIS — Z515 Encounter for palliative care: Secondary | ICD-10-CM

## 2019-12-27 DIAGNOSIS — K921 Melena: Secondary | ICD-10-CM

## 2019-12-27 DIAGNOSIS — Z7189 Other specified counseling: Secondary | ICD-10-CM

## 2019-12-27 HISTORY — PX: ESOPHAGOGASTRODUODENOSCOPY (EGD) WITH PROPOFOL: SHX5813

## 2019-12-27 LAB — CBC WITH DIFFERENTIAL/PLATELET
Abs Immature Granulocytes: 0.55 10*3/uL — ABNORMAL HIGH (ref 0.00–0.07)
Basophils Absolute: 0.1 10*3/uL (ref 0.0–0.1)
Basophils Relative: 1 %
Eosinophils Absolute: 0.1 10*3/uL (ref 0.0–0.5)
Eosinophils Relative: 0 %
HCT: 25.6 % — ABNORMAL LOW (ref 36.0–46.0)
Hemoglobin: 8.9 g/dL — ABNORMAL LOW (ref 12.0–15.0)
Immature Granulocytes: 4 %
Lymphocytes Relative: 8 %
Lymphs Abs: 1.1 10*3/uL (ref 0.7–4.0)
MCH: 35.9 pg — ABNORMAL HIGH (ref 26.0–34.0)
MCHC: 34.8 g/dL (ref 30.0–36.0)
MCV: 103.2 fL — ABNORMAL HIGH (ref 80.0–100.0)
Monocytes Absolute: 0.9 10*3/uL (ref 0.1–1.0)
Monocytes Relative: 6 %
Neutro Abs: 11.3 10*3/uL — ABNORMAL HIGH (ref 1.7–7.7)
Neutrophils Relative %: 81 %
Platelets: 138 10*3/uL — ABNORMAL LOW (ref 150–400)
RBC: 2.48 MIL/uL — ABNORMAL LOW (ref 3.87–5.11)
RDW: 14.2 % (ref 11.5–15.5)
WBC: 13.9 10*3/uL — ABNORMAL HIGH (ref 4.0–10.5)
nRBC: 0 % (ref 0.0–0.2)

## 2019-12-27 LAB — BODY FLUID CELL COUNT WITH DIFFERENTIAL
Eos, Fluid: 0 %
Lymphs, Fluid: 29 %
Monocyte-Macrophage-Serous Fluid: 7 %
Neutrophil Count, Fluid: 64 %
Total Nucleated Cell Count, Fluid: 45 cu mm

## 2019-12-27 LAB — PROTIME-INR
INR: 1.4 — ABNORMAL HIGH (ref 0.8–1.2)
Prothrombin Time: 16.3 seconds — ABNORMAL HIGH (ref 11.4–15.2)

## 2019-12-27 LAB — COMPREHENSIVE METABOLIC PANEL
ALT: 39 U/L (ref 0–44)
AST: 145 U/L — ABNORMAL HIGH (ref 15–41)
Albumin: 2.4 g/dL — ABNORMAL LOW (ref 3.5–5.0)
Alkaline Phosphatase: 287 U/L — ABNORMAL HIGH (ref 38–126)
Anion gap: 11 (ref 5–15)
BUN: 34 mg/dL — ABNORMAL HIGH (ref 6–20)
CO2: 18 mmol/L — ABNORMAL LOW (ref 22–32)
Calcium: 7.9 mg/dL — ABNORMAL LOW (ref 8.9–10.3)
Chloride: 98 mmol/L (ref 98–111)
Creatinine, Ser: 1.11 mg/dL — ABNORMAL HIGH (ref 0.44–1.00)
GFR calc Af Amer: >60 mL/min (ref >60)
GFR calc non Af Amer: 55 mL/min — ABNORMAL LOW (ref >60)
Glucose, Bld: 59 mg/dL — ABNORMAL LOW (ref 70–99)
Potassium: 3.2 mmol/L — ABNORMAL LOW (ref 3.5–5.1)
Sodium: 127 mmol/L — ABNORMAL LOW (ref 135–145)
Total Bilirubin: 22.3 mg/dL (ref 0.3–1.2)
Total Protein: 5.4 g/dL — ABNORMAL LOW (ref 6.5–8.1)

## 2019-12-27 LAB — HEMOGLOBIN AND HEMATOCRIT, BLOOD
HCT: 25.3 % — ABNORMAL LOW (ref 36.0–46.0)
HCT: 27.5 % — ABNORMAL LOW (ref 36.0–46.0)
HCT: 24.9 % — ABNORMAL LOW (ref 36.0–46.0)
Hemoglobin: 9.2 g/dL — ABNORMAL LOW (ref 12.0–15.0)
Hemoglobin: 9.5 g/dL — ABNORMAL LOW (ref 12.0–15.0)
Hemoglobin: 8.9 g/dL — ABNORMAL LOW (ref 12.0–15.0)

## 2019-12-27 LAB — AMYLASE, PLEURAL OR PERITONEAL FLUID: Amylase, Fluid: 18 U/L

## 2019-12-27 LAB — GLUCOSE, CAPILLARY
Glucose-Capillary: 58 mg/dL — ABNORMAL LOW (ref 70–99)
Glucose-Capillary: 75 mg/dL (ref 70–99)

## 2019-12-27 LAB — PROTEIN, PLEURAL OR PERITONEAL FLUID: Total protein, fluid: <3 g/dL

## 2019-12-27 LAB — GLUCOSE, PLEURAL OR PERITONEAL FLUID: Glucose, Fluid: 68 mg/dL

## 2019-12-27 LAB — PROCALCITONIN: Procalcitonin: 1.11 ng/mL

## 2019-12-27 LAB — ALBUMIN, PLEURAL OR PERITONEAL FLUID: Albumin, Fluid: <1 g/dL

## 2019-12-27 LAB — LACTATE DEHYDROGENASE, PLEURAL OR PERITONEAL FLUID: LD, Fluid: 32 U/L — ABNORMAL HIGH (ref 3–23)

## 2019-12-27 LAB — AMMONIA: Ammonia: 60 umol/L — ABNORMAL HIGH (ref 9–35)

## 2019-12-27 LAB — LEGIONELLA PNEUMOPHILA SEROGP 1 UR AG: L. pneumophila Serogp 1 Ur Ag: NEGATIVE

## 2019-12-27 LAB — APTT: aPTT: 38 seconds — ABNORMAL HIGH (ref 24–36)

## 2019-12-27 SURGERY — ESOPHAGOGASTRODUODENOSCOPY (EGD) WITH PROPOFOL
Anesthesia: General

## 2019-12-27 MED ORDER — POTASSIUM CHLORIDE 10 MEQ/100ML IV SOLN
10.0000 meq | INTRAVENOUS | Status: AC
  Administered 2019-12-27 (×3): 10 meq via INTRAVENOUS
  Filled 2019-12-27 (×2): qty 100

## 2019-12-27 MED ORDER — PROPOFOL 10 MG/ML IV BOLUS
INTRAVENOUS | Status: DC | PRN
  Administered 2019-12-27: 20 mg via INTRAVENOUS
  Administered 2019-12-27: 50 mg via INTRAVENOUS

## 2019-12-27 MED ORDER — PROPOFOL 10 MG/ML IV BOLUS
INTRAVENOUS | Status: AC
  Filled 2019-12-27: qty 20

## 2019-12-27 MED ORDER — PREDNISOLONE 5 MG PO TABS: 40.0000 mg | ORAL_TABLET | Freq: Every day | ORAL | Status: DC

## 2019-12-27 MED ORDER — POTASSIUM CHLORIDE 10 MEQ/100ML IV SOLN
10.0000 meq | INTRAVENOUS | Status: DC
  Filled 2019-12-27: qty 100

## 2019-12-27 MED ORDER — GLYCOPYRROLATE 0.2 MG/ML IJ SOLN
INTRAMUSCULAR | Status: DC | PRN
  Administered 2019-12-27: .2 mg via INTRAVENOUS

## 2019-12-27 MED ORDER — PREDNISONE 20 MG PO TABS
40.0000 mg | ORAL_TABLET | Freq: Every day | ORAL | Status: DC
  Administered 2019-12-27 – 2019-12-28 (×2): 40 mg via ORAL
  Filled 2019-12-27 (×2): qty 2

## 2019-12-27 MED ORDER — DEXTROSE 50 % IV SOLN
INTRAVENOUS | Status: AC
  Filled 2019-12-27: qty 50

## 2019-12-27 MED ORDER — PROPOFOL 500 MG/50ML IV EMUL
INTRAVENOUS | Status: DC | PRN
  Administered 2019-12-27: 75 ug/kg/min via INTRAVENOUS

## 2019-12-27 MED ORDER — PHENYLEPHRINE HCL (PRESSORS) 10 MG/ML IV SOLN
INTRAVENOUS | Status: DC | PRN
  Administered 2019-12-27: 100 ug via INTRAVENOUS
  Administered 2019-12-27: 200 ug via INTRAVENOUS
  Administered 2019-12-27: 100 ug via INTRAVENOUS

## 2019-12-27 MED ORDER — GLYCOPYRROLATE 0.2 MG/ML IJ SOLN
INTRAMUSCULAR | Status: AC
  Filled 2019-12-27: qty 1

## 2019-12-27 MED ORDER — LIDOCAINE HCL (PF) 2 % IJ SOLN
INTRAMUSCULAR | Status: AC
  Filled 2019-12-27: qty 5

## 2019-12-27 MED ORDER — POTASSIUM CHLORIDE 10 MEQ/100ML IV SOLN: 10.0000 meq | INTRAVENOUS | Status: DC

## 2019-12-27 MED ORDER — LIDOCAINE HCL (CARDIAC) PF 100 MG/5ML IV SOSY
PREFILLED_SYRINGE | INTRAVENOUS | Status: DC | PRN
  Administered 2019-12-27: 80 mg via INTRAVENOUS

## 2019-12-27 NOTE — Consult Note (Signed)
Consultation Note Date: 12/27/2019   Patient Name: Katherine Goodwin  DOB: Sep 11, 1960  MRN: [id number]  Age / Sex: 59 y.o., female  PCP: Glean Hess, MD Referring Physician: Max Sane, MD  Reason for Consultation: Establishing goals of care  HPI/Patient Profile: Katherine Goodwin is a 58 y.o. female with medical history significant for alcohol use disorder with severe hepatic steatosis and recent imaging suggestive of portal hypertension with paraesophageal and proximal perigastric varices, hypertension, asthma, and bipolar disorder who presents to the ED for evaluation of increasing lower extremity edema, jaundice, weakness, and blood in stool.  Clinical Assessment and Goals of Care: Patient returning from paracentesis. She is soon going for EGD. She states she is married and has 3 children.   She tells me that functionally, she spends her time laying in bed as she does not have energy.    We discussed her diagnosis, prognosis, GOC, and options.  A detailed discussion was had today regarding advanced directives.  Concepts specific to code status, artifical feeding and hydration, IV antibiotics and rehospitalization were discussed.  The difference between an aggressive medical intervention path and a comfort care path was discussed.  Values and goals of care important to patient and family were attempted to be elicited.  Discussed limitations of medical interventions to prolong quality of life in some situations and discussed the concept of human mortality.  She states she only learned this admission how severe her liver disease is.  She states she is going to stop using alcohol. She tells me she would like to do what is needed to prolong her time on earth. Mrs. Dettmann tells me that prior to this hospitalization, she laid in bed most of the time due to weakness. She is clear that her QOL prior to the  hospitalization was unacceptable, and that she is currently undergoing treatment with the intent of feeling better, and being able to resume her functional independence. At this time, she is unsure if she would want a feeding tube, ventilator support, or CPR. She states she will have to think about these decisions.    SUMMARY OF RECOMMENDATIONS   Recommend palliative at D/C for further [place] conversations.   Prognosis:   Unable to determine       Primary Diagnoses: Present on Admission: . Hypokalemia . Alcoholic steatohepatitis . Decompensated liver disease ([place])   I have reviewed the medical record, interviewed the patient and family, and examined the patient. The following aspects are pertinent.  Past Medical History:  Diagnosis Date  . Asthma   . Depression   . GERD (gastroesophageal reflux disease)   . Hypertension   . Kidney stones   . Osteoarthrosis, unspecified whether generalized or localized, involving lower leg    leg  . Other bipolar disorders   . Seizure ([place])    none since age 28  . Tobacco abuse   . Trigger finger   . Wears dentures    partial upper   Social History   Socioeconomic History  .  Marital status: Married    Spouse name: Not on file  . Number of children: Not on file  . Years of education: Not on file  . Highest education level: Not on file  Occupational History  . Not on file  Tobacco Use  . Smoking status: Current Every Day Smoker    Packs/day: 0.25    Years: 39.00    Pack years: 9.75    Types: Cigarettes  . Smokeless tobacco: Never Used  Vaping Use  . Vaping Use: Never used  Substance and Sexual Activity  . Alcohol use: Yes    Alcohol/week: 7.0 standard drinks    Types: 3 Cans of beer, 4 Shots of liquor per week    Comment: Pt states 3-4 shots a day  . Drug use: Yes    Types: Marijuana    Comment: Smokes Marijuana 2-3 times a month.   . Sexual activity: Not on file  Other Topics Concern  . Not on file  Social History  Narrative  . Not on file   Social Determinants of Health   Financial Resource Strain:   . Difficulty of Paying Living Expenses:   Food Insecurity:   . Worried About Charity fundraiser in the Last Year:   . Arboriculturist in the Last Year:   Transportation Needs:   . Film/video editor (Medical):   Marland Kitchen Lack of Transportation (Non-Medical):   Physical Activity:   . Days of Exercise per Week:   . Minutes of Exercise per Session:   Stress:   . Feeling of Stress :   Social Connections:   . Frequency of Communication with Friends and Family:   . Frequency of Social Gatherings with Friends and Family:   . Attends Religious Services:   . Active Member of Clubs or Organizations:   . Attends Archivist Meetings:   Marland Kitchen Marital Status:    Family History  Problem Relation Age of Onset  . Heart disease Father 11       CABG x 3  . Heart attack Father 103       MI  . Hypertension Mother   . Throat cancer Brother    Scheduled Meds: . lactulose  30 g Oral TID  . multivitamin with minerals  1 tablet Oral Daily  . pantoprazole (PROTONIX) IV  40 mg Intravenous Q12H  . sodium chloride  2 g Oral TID WC   Continuous Infusions: . sodium chloride Stopped (12/26/19 1109)  . cefTRIAXone (ROCEPHIN)  IV 2 g (12/27/19 0931)  . metronidazole 500 mg (12/27/19 0526)  . potassium chloride     PRN Meds:.oxyCODONE Medications Prior to Admission:  Prior to Admission medications   Medication Sig Start Date End Date Taking? Authorizing Provider  diphenhydrAMINE (BENADRYL) 25 MG tablet Take 25 mg by mouth every 6 (six) hours as needed for itching or allergies.    Yes [provider]  ibuprofen (ADVIL,MOTRIN) 800 MG tablet Take 800 mg by mouth every 8 (eight) hours as needed.   Yes [provider]  lactulose (CHRONULAC) 10 GM/15ML solution Take 15 mLs (10 g total) by mouth 3 (three) times daily as needed (to have 2-3 loose BM a day). 12/15/19  Yes Barb Merino, MD  Multiple  Vitamin (MULTIVITAMIN WITH MINERALS) TABS tablet Take 1 tablet by mouth daily. 12/16/19  Yes Barb Merino, MD  rifaximin (XIFAXAN) 550 MG TABS tablet Take 1 tablet (550 mg total) by mouth 2 (two) times daily. 12/15/19 01/14/20  Yes Barb Merino, MD  spironolactone (ALDACTONE) 50 MG tablet Take 1 tablet (50 mg total) by mouth daily. 12/16/19 01/15/20 Yes Barb Merino, MD   Allergies  Allergen Reactions  . Prozac [Fluoxetine Hcl] Other (See Comments)    Homicidal and suicidal thoughts  . Seroquel [Quetiapine Fumarate] Other (See Comments)    Homicidal and suicidal thoughts  . Sulfa Antibiotics Nausea And Vomiting   Review of Systems  Constitutional: Positive for activity change and fatigue.    Physical Exam Pulmonary:     Effort: Pulmonary effort is normal.  Neurological:     Mental Status: She is alert.     Vital Signs: BP 96/63 (BP Location: Left Arm)   Pulse 83   Temp 97.7 F (36.5 C)   Resp 20   Ht 5\' 5"  (1.651 m)   Wt 74.8 kg   SpO2 96%   BMI 27.46 kg/m  Pain Scale: 0-10 POSS *See Group Information*: S-Acceptable,Sleep, easy to arouse Pain Score: Asleep   SpO2: SpO2: 96 % O2 Device:SpO2: 96 % O2 Flow Rate: .   IO: Intake/output summary:   Intake/Output Summary (Last 24 hours) at 12/27/2019 1301 Last data filed at 12/27/2019 0309 Gross per 24 hour  Intake 554.21 ml  Output --  Net 554.21 ml    LBM: Last BM Date: 12/26/19 Baseline Weight: Weight: 74.8 kg Most recent weight: Weight: 74.8 kg     Palliative Assessment/Data:     Time In: 2:20 Time Out: 2:50 Time Total: 30 min Greater than 50%  of this time was spent counseling and coordinating care related to the above assessment and plan.  Signed by: Asencion Gowda, NP   Please contact Palliative Medicine Team phone at [phone] for questions and concerns.  For individual provider: See Shea Evans

## 2019-12-27 NOTE — Anesthesia Postprocedure Evaluation (Signed)
Anesthesia Post Note  Patient: Katherine Goodwin  Procedure(s) Performed: ESOPHAGOGASTRODUODENOSCOPY (EGD) WITH PROPOFOL (N/A )  Patient location during evaluation: Endoscopy Anesthesia Type: General Level of consciousness: awake and alert and oriented Pain management: pain level controlled Vital Signs Assessment: post-procedure vital signs reviewed and stable Respiratory status: spontaneous breathing Cardiovascular status: blood pressure returned to baseline Anesthetic complications: no   No complications documented.   Last Vitals:  Vitals:   12/27/19 1516 12/27/19 1526  BP: (!) (P) 97/56 (!) 100/58  Pulse: (P) 89 92  Resp: (P) 14 15  Temp: (!) (P) 36.4 C   SpO2:  98%    Last Pain:  Vitals:   12/27/19 1516  TempSrc: (P) Temporal  PainSc:                  Kebin Maye

## 2019-12-27 NOTE — Plan of Care (Signed)
Continuing with plan of care. 

## 2019-12-27 NOTE — Procedures (Signed)
Ultrasound-guided diagnostic and therapeutic paracentesis performed yielding 1.7 liters of golden yellow fluid. No immediate complications. A portion of the fluid was sent to the lab for preordered studies. EBL none

## 2019-12-27 NOTE — Anesthesia Preprocedure Evaluation (Addendum)
Anesthesia Evaluation  Patient identified by MRN, date of birth, ID band Patient awake    Reviewed: Allergy & Precautions, H&P , NPO status , Patient's Chart, lab work & pertinent test results, reviewed documented beta blocker date and time   Airway Mallampati: II  TM Distance: >3 FB Neck ROM: full    Dental no notable dental hx. (+) Partial Upper, Chipped   Pulmonary asthma , Current Smoker,    Pulmonary exam normal breath sounds clear to auscultation       Cardiovascular Exercise Tolerance: Good hypertension, + Peripheral Vascular Disease  Normal cardiovascular exam Rhythm:regular Rate:Normal     Neuro/Psych Seizures -,  PSYCHIATRIC DISORDERS Depression Bipolar Disorder    GI/Hepatic GERD  ,(+) Cirrhosis       , Hepatitis -  Endo/Other  negative endocrine ROS  Renal/GU Renal disease  negative genitourinary   Musculoskeletal  (+) Arthritis ,   Abdominal   Peds negative pediatric ROS (+)  Hematology negative hematology ROS (+) anemia ,   Anesthesia Other Findings Past Medical History: No date: Asthma No date: Depression No date: GERD (gastroesophageal reflux disease) No date: Hypertension No date: Kidney stones No date: Osteoarthrosis, unspecified whether generalized or localized,  involving lower leg     Comment:  leg No date: Other bipolar disorders No date: Seizure [facility])     Comment:  none since age 59 No date: Tobacco abuse No date: Trigger finger No date: Wears dentures     Comment:  partial upper Echo shows EF of 60-65% with nl LVF and no regional wall motion abnormalities  Reproductive/Obstetrics negative OB ROS                            Anesthesia Physical  Anesthesia Plan  ASA: II  Anesthesia Plan: General   Post-op Pain Management:    Induction: Intravenous  PONV Risk Score and Plan: Propofol infusion  Airway Management Planned: Nasal  Cannula  Additional Equipment:   Intra-op Plan:   Post-operative Plan:   Informed Consent: I have reviewed the patients History and Physical, chart, labs and discussed the procedure including the risks, benefits and alternatives for the proposed anesthesia with the patient or authorized representative who has indicated his/her understanding and acceptance.     Dental Advisory Given  Plan Discussed with: CRNA, Anesthesiologist and Surgeon  Anesthesia Plan Comments:         Anesthesia Quick Evaluation

## 2019-12-27 NOTE — Progress Notes (Signed)
[facility] [place] NAME: Katherine Goodwin    MR#:  [id number]  DATE OF BIRTH:  July 14, 1960  SUBJECTIVE:  CHIEF COMPLAINT:   Chief Complaint  Patient presents with  . Leg Swelling  Hemoglobin drop to 8.9, tired, sleepy REVIEW OF SYSTEMS:  ROS unable to assess due to mental status DRUG ALLERGIES:   Allergies  Allergen Reactions  . Prozac [Fluoxetine Hcl] Other (See Comments)    Homicidal and suicidal thoughts  . Seroquel [Quetiapine Fumarate] Other (See Comments)    Homicidal and suicidal thoughts  . Sulfa Antibiotics Nausea And Vomiting   VITALS:  Blood pressure 117/67, pulse 82, temperature 97.8 F (36.6 C), temperature source Oral, resp. rate 18, height 5\' 5"  (1.651 m), weight 74.8 kg, SpO2 98 %. PHYSICAL EXAMINATION:  Physical Exam HENT:     Head: Normocephalic and atraumatic.  Eyes:     Conjunctiva/sclera: Conjunctivae normal.     Pupils: Pupils are equal, round, and reactive to light.  Neck:     Thyroid: No thyromegaly.     Trachea: No tracheal deviation.  Cardiovascular:     Rate and Rhythm: Normal rate and regular rhythm.     Heart sounds: Normal heart sounds.  Pulmonary:     Effort: Pulmonary effort is normal. No respiratory distress.     Breath sounds: Normal breath sounds. No wheezing.  Chest:     Chest wall: No tenderness.  Abdominal:     General: Bowel sounds are normal. There is distension.     Palpations: Abdomen is soft. There is fluid wave.     Tenderness: There is generalized abdominal tenderness.  Musculoskeletal:        General: Normal range of motion.     Cervical back: Normal range of motion and neck supple.  Skin:    General: Skin is warm and dry.     Findings: No rash.  Neurological:     Mental Status: She is confused.     Cranial Nerves: No cranial nerve deficit.    LABORATORY PANEL:  Female CBC Recent Labs  Lab 12/27/19 0442 12/27/19 0442 12/27/19 1337  WBC 13.9*  --   --   HGB 8.9*   < > 9.2*    HCT 25.6*   < > 25.3*  PLT 138*  --   --    < > = values in this interval not displayed.   ------------------------------------------------------------------------------------------------------------------ Chemistries  Recent Labs  Lab 12/26/19 0449 12/26/19 1158 12/27/19 0442  NA 121*   < > 127*  K 3.2*   < > 3.2*  CL 94*   < > 98  CO2 19*   < > 18*  GLUCOSE 135*   < > 59*  BUN 39*   < > 34*  CREATININE 1.41*   < > 1.11*  CALCIUM 7.2*   < > 7.9*  MG 2.6*  --   --   AST 145*   < > 145*  ALT 41   < > 39  ALKPHOS 322*   < > 287*  BILITOT 18.5*   < > 22.3*   < > = values in this interval not displayed.   RADIOLOGY:  US Paracentesis  Result Date: 12/27/2019 INDICATION: Patient with history of alcohol abuse, severe hepatic steatosis, recent alcoholic hepatitis, decompensated liver disease, ascites. Request made for diagnostic and therapeutic paracentesis. EXAM: ULTRASOUND GUIDED DIAGNOSTIC AND THERAPEUTIC PARACENTESIS MEDICATIONS: 1% lidocaine to skin and subcutaneous  tissue COMPLICATIONS: None immediate. PROCEDURE: Informed written consent was obtained from the patient after a discussion of the risks, benefits and alternatives to treatment. A timeout was performed prior to the initiation of the procedure. Initial ultrasound scanning demonstrates a small to moderate amount of ascites within the right mid to lower abdominal quadrant. The right mid to lower abdomen was prepped and draped in the usual sterile fashion. 1% lidocaine was used for local anesthesia. Following this, a 6 Fr Safe-T-Centesis catheter was introduced. An ultrasound image was saved for documentation purposes. The paracentesis was performed. The catheter was removed and a dressing was applied. The patient tolerated the procedure well without immediate post procedural complication. FINDINGS: A total of approximately 1.7 liters of golden yellow fluid was removed. Samples were sent to the laboratory as requested by the  clinical team. IMPRESSION: Successful ultrasound-guided diagnostic and therapeutic paracentesis yielding 1.7 liters of peritoneal fluid. Read by: Rowe Robert, PA-C Electronically Signed   By: Jacqulynn Cadet M.D.   On: 12/27/2019 13:02   ASSESSMENT AND PLAN:  59 year old female with a known history of ongoing alcohol abuse, severe hepatic steatosis, hypertension, asthma, bipolar disorder admitted for decompensated liver disease with bright red blood per rectum  Decompensated liver disease with hyperbilirubinemia/transaminitis Right upper quadrant ultrasound showing cholelithiasis and gallbladder sludge without evidence of acute cholecystitis, CBD 4.7 mm, fatty liver MRI not showing any other acute pathology GI consult appreciated Total bilirubin 23.9->22.3 Due to high discriminant function meld score of 29,GI has started prednisone  Hypotension: Present on admission requiring pressors.  Improved now Echo pending, US guided paracentesis with removal of 1.7 L of fluid Empiric Rocephin + Flagyl  Acute blood loss anemia  bright red blood per rectum  IV Protonix 40 mg twice daily for now Hemoglobin 8.9 (10.2 on admission) Monitor H&H every 6 hours FOBT positive in ED Transfuse for hemoglobin less than 8 Status post EGD showing no active bleeder.  Appreciate GI input  Acute kidney injury Likely ATN from hypotension Improving with hydration, creatinine 1.1  Hepatic encephalopathy Monitor, ammonia 87, recheck today Continue lactulose  Hyponatremia/hypokalemia Replete and recheck  Overall prognosis poor.  Palliative care consultation for goals of care discussion  Covid is negative  Body mass index is 27.44 kg/m.      Status is: Inpatient  Remains inpatient appropriate because:Inpatient level of care appropriate due to severity of illness   Dispo: The patient is from: Home              Anticipated d/c is to: Home              Anticipated d/c date is: 3 days               Patient currently is not medically stable to d/c.    DVT prophylaxis:            SCDs Start: 12/26/19 0053     Family Communication: Updated patient's daughter Katherine Goodwin at [phone]    All the records are reviewed and case discussed with Care Management/Social Worker. Management plans discussed with the patient, nursing and they are in agreement.  CODE STATUS: Full Code  TOTAL TIME TAKING CARE OF THIS PATIENT: 35 minutes.   More than 50% of the time was spent in counseling/coordination of care: YES  POSSIBLE D/C IN 2-3 DAYS, DEPENDING ON CLINICAL CONDITION.   Max Sane M.D on 12/27/2019 at 5:07 PM  [facility]   CC: Primary care physician; Glean Hess, MD  Note: This dictation was prepared with Dragon dictation along with smaller phrase technology. Any transcriptional errors that result from this process are unintentional.

## 2019-12-27 NOTE — Op Note (Signed)
[facility] Gastroenterology Patient Name: Katherine Goodwin Procedure Date: 12/27/2019 2:36 PM MRN: [id number] Account #: [id number] Date of Birth: [birth year] Admit Type: Inpatient Age: 59 Room: [facility] ENDO ROOM 3 Gender: Female Note Status: Finalized Procedure:             Upper GI endoscopy Indications:           Acute post hemorrhagic anemia Providers:             Lucilla Lame MD, MD Referring MD:          Halina Maidens, MD (Referring MD) Medicines:             Propofol per Anesthesia Complications:         No immediate complications. Procedure:             Pre-Anesthesia Assessment:                        - Prior to the procedure, a History and Physical was                         performed, and patient medications and allergies were                         reviewed. The patient's tolerance of previous                         anesthesia was also reviewed. The risks and benefits                         of the procedure and the sedation options and risks                         were discussed with the patient. All questions were                         answered, and informed consent was obtained. Prior                         Anticoagulants: The patient has taken no previous                         anticoagulant or antiplatelet agents. ASA Grade                         Assessment: III - A patient with severe systemic                         disease. After reviewing the risks and benefits, the                         patient was deemed in satisfactory condition to                         undergo the procedure.                        After obtaining informed consent, the endoscope was  passed under direct vision. Throughout the procedure,                         the patient's blood pressure, pulse, and oxygen                         saturations were monitored continuously. The Endoscope                         was introduced through the mouth, and  advanced to the                         second part of duodenum. The upper GI endoscopy was                         accomplished without difficulty. The patient tolerated                         the procedure well. Findings:      The examined esophagus was normal.      Moderate portal hypertensive gastropathy was found in the entire       examined stomach.      The examined duodenum was normal. Impression:            - Normal esophagus.                        - Portal hypertensive gastropathy.                        - Normal examined duodenum.                        - No specimens collected. Recommendation:        - Discharge patient to home.                        - Resume previous diet.                        - Continue present medications. Procedure Code(s):     --- Professional ---                        [phone], Esophagogastroduodenoscopy, flexible,                         transoral; diagnostic, including collection of                         specimen(s) by brushing or washing, when performed                         (separate procedure) Diagnosis Code(s):     --- Professional ---                        D62, Acute posthemorrhagic anemia                        K31.89, Other diseases of stomach and duodenum CPT copyright 2019 [employer] Association. All rights reserved. The codes documented in this report  are preliminary and upon coder review may  be revised to meet current compliance requirements. Lucilla Lame MD, MD 12/27/2019 3:12:49 PM This report has been signed electronically. Number of Addenda: 0 Note Initiated On: 12/27/2019 2:36 PM Estimated Blood Loss:  Estimated blood loss: none.      [facility]

## 2019-12-27 NOTE — Progress Notes (Signed)
The patient has had a continued drop in her hemoglobin.  She denies any obvious GI bleeding.  She also denies any abdominal pain.  The patient has been n.p.o. and states that she is hungry.  The patient just returned from ultrasound-guided paracentesis.  The patient's discriminant function was calculated and is clearly over 32 so the patient will be started on prednisolone 40 mg a day.  Due to the patient's continued drop in hemoglobin she will also be brought to endoscopy for upper endoscopy.

## 2019-12-27 NOTE — Progress Notes (Signed)
The patient had upper endoscopy without any sign of esophageal varices or gastric varices.  There is hypertensive portal gastropathy.  The patient meld score is 29 which gives the patient a 19.6% 67-month mortality.  I do not believe that a colonoscopy at this time should be considered since the patient is unlikely to be able to prep for the colonoscopy.  The patient was started on steroids due to a high discriminant function.  She had a colonoscopy 1 year ago that showed 2 small polyps and internal hemorrhoids.

## 2019-12-27 NOTE — Transfer of Care (Signed)
Immediate Anesthesia Transfer of Care Note  Patient: Katherine Goodwin  Procedure(s) Performed: ESOPHAGOGASTRODUODENOSCOPY (EGD) WITH PROPOFOL (N/A )  Patient Location: PACU  Anesthesia Type:General  Level of Consciousness: awake, alert  and oriented  Airway & Oxygen Therapy: Patient Spontanous Breathing and Patient connected to nasal cannula oxygen  Post-op Assessment: Report given to RN and Post -op Vital signs reviewed and stable  Post vital signs: Reviewed and stable  Last Vitals:  Vitals Value Taken Time  BP    Temp    Pulse 88 12/27/19 1518  Resp 12 12/27/19 1518  SpO2 97 % 12/27/19 1518  Vitals shown include unvalidated device data.  Last Pain:  Vitals:   12/27/19 1420  TempSrc: Temporal  PainSc: 0-No pain         Complications: No complications documented.

## 2019-12-28 DIAGNOSIS — K7011 Alcoholic hepatitis with ascites: Secondary | ICD-10-CM

## 2019-12-28 DIAGNOSIS — K7290 Hepatic failure, unspecified without coma: Secondary | ICD-10-CM

## 2019-12-28 LAB — CBC WITH DIFFERENTIAL/PLATELET
Abs Immature Granulocytes: 0.68 10*3/uL — ABNORMAL HIGH (ref 0.00–0.07)
Basophils Absolute: 0.1 10*3/uL (ref 0.0–0.1)
Basophils Relative: 0 %
Eosinophils Absolute: 0 10*3/uL (ref 0.0–0.5)
Eosinophils Relative: 0 %
HCT: 25.6 % — ABNORMAL LOW (ref 36.0–46.0)
Hemoglobin: 9.2 g/dL — ABNORMAL LOW (ref 12.0–15.0)
Immature Granulocytes: 5 %
Lymphocytes Relative: 3 %
Lymphs Abs: 0.5 10*3/uL — ABNORMAL LOW (ref 0.7–4.0)
MCH: 36.8 pg — ABNORMAL HIGH (ref 26.0–34.0)
MCHC: 35.9 g/dL (ref 30.0–36.0)
MCV: 102.4 fL — ABNORMAL HIGH (ref 80.0–100.0)
Monocytes Absolute: 0.4 10*3/uL (ref 0.1–1.0)
Monocytes Relative: 3 %
Neutro Abs: 13 10*3/uL — ABNORMAL HIGH (ref 1.7–7.7)
Neutrophils Relative %: 89 %
Platelets: 138 10*3/uL — ABNORMAL LOW (ref 150–400)
RBC: 2.5 MIL/uL — ABNORMAL LOW (ref 3.87–5.11)
RDW: 14.8 % (ref 11.5–15.5)
WBC: 14.6 10*3/uL — ABNORMAL HIGH (ref 4.0–10.5)
nRBC: 0 % (ref 0.0–0.2)

## 2019-12-28 LAB — COMPREHENSIVE METABOLIC PANEL
ALT: 40 U/L (ref 0–44)
AST: 137 U/L — ABNORMAL HIGH (ref 15–41)
Albumin: 2.3 g/dL — ABNORMAL LOW (ref 3.5–5.0)
Alkaline Phosphatase: 317 U/L — ABNORMAL HIGH (ref 38–126)
Anion gap: 9 (ref 5–15)
BUN: 28 mg/dL — ABNORMAL HIGH (ref 6–20)
CO2: 18 mmol/L — ABNORMAL LOW (ref 22–32)
Calcium: 8.1 mg/dL — ABNORMAL LOW (ref 8.9–10.3)
Chloride: 104 mmol/L (ref 98–111)
Creatinine, Ser: 0.86 mg/dL (ref 0.44–1.00)
GFR calc Af Amer: >60 mL/min (ref >60)
GFR calc non Af Amer: >60 mL/min (ref >60)
Glucose, Bld: 131 mg/dL — ABNORMAL HIGH (ref 70–99)
Potassium: 3.1 mmol/L — ABNORMAL LOW (ref 3.5–5.1)
Sodium: 131 mmol/L — ABNORMAL LOW (ref 135–145)
Total Bilirubin: 22.2 mg/dL (ref 0.3–1.2)
Total Protein: 5.4 g/dL — ABNORMAL LOW (ref 6.5–8.1)

## 2019-12-28 LAB — HEMOGLOBIN AND HEMATOCRIT, BLOOD
HCT: 28.4 % — ABNORMAL LOW (ref 36.0–46.0)
HCT: 28.5 % — ABNORMAL LOW (ref 36.0–46.0)
Hemoglobin: 9.4 g/dL — ABNORMAL LOW (ref 12.0–15.0)
Hemoglobin: 9.8 g/dL — ABNORMAL LOW (ref 12.0–15.0)

## 2019-12-28 LAB — PROTIME-INR
INR: 1.5 — ABNORMAL HIGH (ref 0.8–1.2)
Prothrombin Time: 17.5 seconds — ABNORMAL HIGH (ref 11.4–15.2)

## 2019-12-28 LAB — MAGNESIUM: Magnesium: 1.6 mg/dL — ABNORMAL LOW (ref 1.7–2.4)

## 2019-12-28 LAB — AMMONIA: Ammonia: 64 umol/L — ABNORMAL HIGH (ref 9–35)

## 2019-12-28 LAB — APTT: aPTT: 39 seconds — ABNORMAL HIGH (ref 24–36)

## 2019-12-28 MED ORDER — SPIRONOLACTONE 100 MG PO TABS
100.0000 mg | ORAL_TABLET | Freq: Every day | ORAL | Status: DC
  Administered 2019-12-28 – 2019-12-31 (×4): 100 mg via ORAL
  Filled 2019-12-28: qty 4
  Filled 2019-12-28: qty 1
  Filled 2019-12-28 (×2): qty 4
  Filled 2019-12-28 (×3): qty 1

## 2019-12-28 MED ORDER — POTASSIUM CHLORIDE CRYS ER 20 MEQ PO TBCR
40.0000 meq | EXTENDED_RELEASE_TABLET | Freq: Once | ORAL | Status: AC
  Administered 2019-12-28: 40 meq via ORAL
  Filled 2019-12-28: qty 2

## 2019-12-28 MED ORDER — RIFAXIMIN 550 MG PO TABS
550.0000 mg | ORAL_TABLET | Freq: Two times a day (BID) | ORAL | Status: DC
  Administered 2019-12-28 – 2019-12-31 (×7): 550 mg via ORAL
  Filled 2019-12-28 (×8): qty 1

## 2019-12-28 NOTE — Progress Notes (Signed)
[facility] [place] NAME: Katherine Goodwin    MR#:  [id number]  DATE OF BIRTH:  1960-11-19  SUBJECTIVE:  CHIEF COMPLAINT:   Chief Complaint  Patient presents with  . Leg Swelling  tired, sleepy.  No new complaints.  Tolerated diet fine REVIEW OF SYSTEMS:  Review of Systems  Constitutional: Positive for malaise/fatigue. Negative for diaphoresis, fever and weight loss.  HENT: Negative for ear discharge, ear pain, hearing loss, nosebleeds, sore throat and tinnitus.   Eyes: Negative for blurred vision and pain.  Respiratory: Negative for cough, hemoptysis, shortness of breath and wheezing.   Cardiovascular: Negative for chest pain, palpitations, orthopnea and leg swelling.  Gastrointestinal: Negative for abdominal pain, blood in stool, constipation, diarrhea, heartburn, nausea and vomiting.  Genitourinary: Negative for dysuria, frequency and urgency.  Musculoskeletal: Negative for back pain and myalgias.  Skin: Negative for itching and rash.  Neurological: Negative for dizziness, tingling, tremors, focal weakness, seizures, weakness and headaches.  Psychiatric/Behavioral: Negative for depression. The patient is not nervous/anxious.     DRUG ALLERGIES:   Allergies  Allergen Reactions  . Prozac [Fluoxetine Hcl] Other (See Comments)    Homicidal and suicidal thoughts  . Seroquel [Quetiapine Fumarate] Other (See Comments)    Homicidal and suicidal thoughts  . Sulfa Antibiotics Nausea And Vomiting   VITALS:  Blood pressure 114/82, pulse 70, temperature 98 F (36.7 C), temperature source Oral, resp. rate 16, height '5\' 5"'  (1.651 m), weight 74.8 kg, SpO2 99 %. PHYSICAL EXAMINATION:  Physical Exam HENT:     Head: Normocephalic and atraumatic.  Eyes:     Conjunctiva/sclera: Conjunctivae normal.     Pupils: Pupils are equal, round, and reactive to light.  Neck:     Thyroid: No thyromegaly.     Trachea: No tracheal deviation.  Cardiovascular:      Rate and Rhythm: Normal rate and regular rhythm.     Heart sounds: Normal heart sounds.  Pulmonary:     Effort: Pulmonary effort is normal. No respiratory distress.     Breath sounds: Normal breath sounds. No wheezing.  Chest:     Chest wall: No tenderness.  Abdominal:     General: Bowel sounds are normal.     Palpations: Abdomen is soft.     Tenderness: There is no abdominal tenderness.  Musculoskeletal:        General: Normal range of motion.     Cervical back: Normal range of motion and neck supple.  Skin:    General: Skin is warm and dry.     Coloration: Skin is jaundiced.     Findings: No rash.  Neurological:     Mental Status: She is alert and oriented to person, place, and time.     Cranial Nerves: No cranial nerve deficit.    LABORATORY PANEL:  Female CBC Recent Labs  Lab 12/28/19 0541  WBC 14.6*  HGB 9.2*  HCT 25.6*  PLT 138*   ------------------------------------------------------------------------------------------------------------------ Chemistries  Recent Labs  Lab 12/26/19 0449 12/26/19 1158 12/28/19 0541  NA 121*   < > 131*  K 3.2*   < > 3.1*  CL 94*   < > 104  CO2 19*   < > 18*  GLUCOSE 135*   < > 131*  BUN 39*   < > 28*  CREATININE 1.41*   < > 0.86  CALCIUM 7.2*   < > 8.1*  MG 2.6*  --   --  AST 145*   < > 137*  ALT 41   < > 40  ALKPHOS 322*   < > 317*  BILITOT 18.5*   < > 22.2*   < > = values in this interval not displayed.   RADIOLOGY:  US Paracentesis  Result Date: 12/27/2019 INDICATION: Patient with history of alcohol abuse, severe hepatic steatosis, recent alcoholic hepatitis, decompensated liver disease, ascites. Request made for diagnostic and therapeutic paracentesis. EXAM: ULTRASOUND GUIDED DIAGNOSTIC AND THERAPEUTIC PARACENTESIS MEDICATIONS: 1% lidocaine to skin and subcutaneous tissue COMPLICATIONS: None immediate. PROCEDURE: Informed written consent was obtained from the patient after a discussion of the risks, benefits and  alternatives to treatment. A timeout was performed prior to the initiation of the procedure. Initial ultrasound scanning demonstrates a small to moderate amount of ascites within the right mid to lower abdominal quadrant. The right mid to lower abdomen was prepped and draped in the usual sterile fashion. 1% lidocaine was used for local anesthesia. Following this, a 6 Fr Safe-T-Centesis catheter was introduced. An ultrasound image was saved for documentation purposes. The paracentesis was performed. The catheter was removed and a dressing was applied. The patient tolerated the procedure well without immediate post procedural complication. FINDINGS: A total of approximately 1.7 liters of golden yellow fluid was removed. Samples were sent to the laboratory as requested by the clinical team. IMPRESSION: Successful ultrasound-guided diagnostic and therapeutic paracentesis yielding 1.7 liters of peritoneal fluid. Read by: Rowe Robert, PA-C Electronically Signed   By: Jacqulynn Cadet M.D.   On: 12/27/2019 13:02   ASSESSMENT AND PLAN:  59 year old female with a known history of ongoing alcohol abuse, severe hepatic steatosis, hypertension, asthma, bipolar disorder admitted for decompensated liver disease with bright red blood per rectum  Decompensated liver disease with hyperbilirubinemia/transaminitis Right upper quadrant ultrasound showing cholelithiasis and gallbladder sludge without evidence of acute cholecystitis, CBD 4.7 mm, fatty liver MRI not showing any other acute pathology GI consult appreciated Total bilirubin 23.9->22.2 Due to high discriminant function meld score of 29,GI has started prednisone on 7/9 Hepatic Function Latest Ref Rng & Units 12/28/2019 12/27/2019 12/26/2019  Total Protein 6.5 - 8.1 g/dL 5.4(L) 5.4(L) 5.7(L)  Albumin 3.5 - 5.0 g/dL 2.3(L) 2.4(L) 2.9(L)  AST 15 - 41 U/L 137(H) 145(H) 135(H)  ALT 0 - 44 U/L 40 39 37  Alk Phosphatase 38 - 126 U/L 317(H) 287(H) 285(H)  Total Bilirubin  0.3 - 1.2 mg/dL 22.2(HH) 22.3(HH) 22.7(HH)  Bilirubin, Direct 0.0 - 0.2 mg/dL - - -   Hypotension: Present on admission requiring pressors.  Improved now Echo within normal limits, US guided paracentesis with removal of 1.7 L of fluid Empiric Rocephin + Flagyl  Acute blood loss anemia  bright red blood per rectum  IV Protonix 40 mg twice daily for now Hemoglobin 9.2 (10.2 on admission) Monitor H&H daily FOBT positive in ED Transfuse for hemoglobin less than 8 Status post EGD on 7/9 showing no active bleeder.  Acute kidney injury Likely ATN from hypotension Improving with hydration, creatinine 1.1  Hepatic encephalopathy Monitor, ammonia 87->64 Continue lactulose  Hyponatremia/hypokalemia Replete and recheck, check magnesium Sodium improving with hydration, improving oral intake  Overall prognosis poor.  Palliative care consultation for goals of care discussion  Covid is negative  Body mass index is 27.44 kg/m.      Status is: Inpatient  Remains inpatient appropriate because:Inpatient level of care appropriate due to severity of illness   Dispo: The patient is from: Home  Anticipated d/c is to: Home              Anticipated d/c date is: 3 days              Patient currently is not medically stable to d/c.  Patient is still very jaundiced with elevated ammonia and liver function test.  Will need to wait for 3 to 4 days to see response to prednisone.    DVT prophylaxis:            SCDs Start: 12/26/19 0053     Family Communication: Updated patient's daughter destiny on 7/9 at [phone]    All the records are reviewed and case discussed with Care Management/Social Worker. Management plans discussed with the patient, nursing and they are in agreement.  CODE STATUS: Full Code  TOTAL TIME TAKING CARE OF THIS PATIENT: 35 minutes.   More than 50% of the time was spent in counseling/coordination of care: YES  POSSIBLE D/C IN 2-3 DAYS, DEPENDING  ON CLINICAL CONDITION.   Max Sane M.D on 12/28/2019 at 11:18 AM  [facility]   CC: Primary care physician; Glean Hess, MD  Note: This dictation was prepared with Dragon dictation along with smaller phrase technology. Any transcriptional errors that result from this process are unintentional.

## 2019-12-28 NOTE — Progress Notes (Addendum)
Jonathon Bellows , MD [address], [place], [place], [place], [address] [address], [place], [place], [place], [address] Phone: [phone]  Fax: [phone]   Katherine Goodwin is being followed for alcoholic hepatitis  Day 2 of follow up   Subjective: No complaints   Objective: Vital signs in last 24 hours: Vitals:   12/27/19 1536 12/27/19 1628 12/27/19 1954 12/28/19 0443  BP: 127/89 117/67 98/71 114/82  Pulse: 93 82 75 70  Resp: 14 18 16 16   Temp:  97.8 F (36.6 C) 97.8 F (36.6 C) 98 F (36.7 C)  TempSrc:  Oral Oral Oral  SpO2: 98% 98% 98% 99%  Weight:      Height:       Weight change:   Intake/Output Summary (Last 24 hours) at 12/28/2019 1041 Last data filed at 12/28/2019 0945 Gross per 24 hour  Intake 740 ml  Output --  Net 740 ml     Exam: Heart:: Regular rate and rhythm, S1S2 present or without murmur or extra heart sounds Lungs: normal, clear to auscultation and clear to auscultation and percussion Abdomen: soft, nontender, normal bowel sounds Liver flap+  Lab Results: @LABTEST2 @ Micro Results: Recent Results (from the past 240 hour(s))  Gastrointestinal Panel by PCR , Stool     Status: None   Collection Time: 12/25/19  9:43 PM   Specimen: STOOL  Result Value Ref Range Status   Campylobacter species NOT DETECTED NOT DETECTED Final   Plesimonas shigelloides NOT DETECTED NOT DETECTED Final   Salmonella species NOT DETECTED NOT DETECTED Final   Yersinia enterocolitica NOT DETECTED NOT DETECTED Final   Vibrio species NOT DETECTED NOT DETECTED Final   Vibrio cholerae NOT DETECTED NOT DETECTED Final   Enteroaggregative E coli (EAEC) NOT DETECTED NOT DETECTED Final   Enteropathogenic E coli (EPEC) NOT DETECTED NOT DETECTED Final   Enterotoxigenic E coli (ETEC) NOT DETECTED NOT DETECTED Final   Shiga like toxin producing E coli (STEC) NOT DETECTED NOT DETECTED Final   Shigella/Enteroinvasive E coli (EIEC) NOT DETECTED NOT DETECTED Final    Cryptosporidium NOT DETECTED NOT DETECTED Final   Cyclospora cayetanensis NOT DETECTED NOT DETECTED Final   Entamoeba histolytica NOT DETECTED NOT DETECTED Final   Giardia lamblia NOT DETECTED NOT DETECTED Final   Adenovirus F40/41 NOT DETECTED NOT DETECTED Final   Astrovirus NOT DETECTED NOT DETECTED Final   Norovirus GI/GII NOT DETECTED NOT DETECTED Final   Rotavirus A NOT DETECTED NOT DETECTED Final   Sapovirus (I, II, IV, and V) NOT DETECTED NOT DETECTED Final    Comment: Performed at [facility], [address] [place], [place] [address]  C Difficile Quick Screen w PCR reflex     Status: Abnormal   Collection Time: 12/25/19  9:43 PM   Specimen: STOOL  Result Value Ref Range Status   C Diff antigen POSITIVE (A) NEGATIVE Final   C Diff toxin NEGATIVE NEGATIVE Final   C Diff interpretation Results are indeterminate. See PCR results.  Final    Comment: Performed at [facility], [address] [place], [place] [address]  C. Diff by PCR, Reflexed     Status: None   Collection Time: 12/25/19  9:43 PM  Result Value Ref Range Status   Toxigenic C. Difficile by PCR NEGATIVE NEGATIVE Final    Comment: Patient is colonized with non toxigenic C. difficile. May not need treatment unless significant symptoms are present. Performed at [facility], [address] [place],  [place] [address]   CULTURE, BLOOD (ROUTINE X 2) w Reflex to ID Panel     Status: None (Preliminary result)   Collection Time: 12/26/19  5:51 AM   Specimen: BLOOD LEFT FOREARM  Result Value Ref Range Status   Specimen Description BLOOD LEFT FOREARM  Final   Special Requests   Final    BOTTLES DRAWN AEROBIC AND ANAEROBIC Blood Culture adequate volume   Culture   Final    NO GROWTH 2 DAYS Performed at [facility], [address] [place], [place] [address]    Report Status PENDING  Incomplete  CULTURE, BLOOD (ROUTINE X 2) w Reflex to ID Panel     Status: None (Preliminary result)    Collection Time: 12/26/19  5:51 AM   Specimen: BLOOD RIGHT HAND  Result Value Ref Range Status   Specimen Description BLOOD RIGHT HAND  Final   Special Requests   Final    BOTTLES DRAWN AEROBIC AND ANAEROBIC Blood Culture adequate volume   Culture   Final    NO GROWTH 2 DAYS Performed at [facility], [address] [place], [place] [address]    Report Status PENDING  Incomplete  SARS Coronavirus 2 by RT PCR (hospital order, performed in [place] hospital lab) Nasopharyngeal Nasopharyngeal Swab     Status: None   Collection Time: 12/26/19 11:58 AM   Specimen: Nasopharyngeal Swab  Result Value Ref Range Status   SARS Coronavirus 2 NEGATIVE NEGATIVE Final    Comment: (NOTE) SARS-CoV-2 target nucleic acids are NOT DETECTED.  The SARS-CoV-2 RNA is generally detectable in upper and lower respiratory specimens during the acute phase of infection. The lowest concentration of SARS-CoV-2 viral copies this assay can detect is 250 copies / mL. A negative result does not preclude SARS-CoV-2 infection and should not be used as the sole basis for treatment or other patient management decisions.  A negative result may occur with improper specimen collection / handling, submission of specimen other than nasopharyngeal swab, presence of viral mutation(s) within the areas targeted by this assay, and inadequate number of viral copies (<250 copies / mL). A negative result must be combined with clinical observations, patient history, and epidemiological information.  Fact Sheet for Patients:   StrictlyIdeas.no  Fact Sheet for Healthcare Providers: BankingDealers.co.za  This test is not yet approved or  cleared by the [place] FDA and has been authorized for detection and/or diagnosis of SARS-CoV-2 by FDA under an Emergency Use Authorization (EUA).  This EUA will remain in effect (meaning this test can be used) for the duration of  the COVID-19 declaration under Section 564(b)(1) of the Act, 21 U.S.C. section 360bbb-3(b)(1), unless the authorization is terminated or revoked sooner.  Performed at [facility], [address] [place], [place] [address]   Body fluid culture     Status: None (Preliminary result)   Collection Time: 12/27/19 12:30 PM   Specimen: PATH Cytology Peritoneal fluid  Result Value Ref Range Status   Specimen Description   Final    PERITONEAL Performed at [facility], [address], [place], [place] [address]    Special Requests   Final    PERITONEAL Performed at [facility], [address] [place], [place] [address]    Gram Stain   Final    RARE WBC PRESENT, PREDOMINANTLY MONONUCLEAR NO ORGANISMS SEEN    Culture   Final    NO GROWTH < 24 HOURS Performed at [facility], [address] [place], [place] [address]    Report Status PENDING  Incomplete   Studies/Results: US Paracentesis  Result Date: 12/27/2019 INDICATION: Patient with history of alcohol abuse, severe hepatic steatosis, recent alcoholic hepatitis, decompensated liver disease, ascites. Request made for diagnostic and therapeutic paracentesis. EXAM: ULTRASOUND GUIDED DIAGNOSTIC AND THERAPEUTIC PARACENTESIS MEDICATIONS: 1% lidocaine to skin and subcutaneous tissue COMPLICATIONS: None immediate. PROCEDURE: Informed written consent was obtained from the patient after a discussion of the risks, benefits and alternatives to treatment. A timeout was performed prior to the initiation of the procedure. Initial ultrasound scanning demonstrates a small to moderate amount of ascites within the right mid to lower abdominal quadrant. The right mid to lower abdomen was prepped and draped in the usual sterile fashion. 1% lidocaine was used for local anesthesia. Following this, a 6 Fr Safe-T-Centesis catheter was introduced. An ultrasound image was saved for documentation purposes. The paracentesis was  performed. The catheter was removed and a dressing was applied. The patient tolerated the procedure well without immediate post procedural complication. FINDINGS: A total of approximately 1.7 liters of golden yellow fluid was removed. Samples were sent to the laboratory as requested by the clinical team. IMPRESSION: Successful ultrasound-guided diagnostic and therapeutic paracentesis yielding 1.7 liters of peritoneal fluid. Read by: Rowe Robert, PA-C Electronically Signed   By: Jacqulynn Cadet M.D.   On: 12/27/2019 13:02   ECHOCARDIOGRAM COMPLETE  Result Date: 12/26/2019    ECHOCARDIOGRAM REPORT   Patient Name:   NAYLEA WIGINGTON Date of Exam: 12/26/2019 Medical Rec #:  [id number]       Height:       65.0 in Accession #:    [id number]      Weight:       165.0 lb Date of Birth:  [birth year]       BSA:          1.823 m Patient Age:    59 years        BP:           115/63 mmHg Patient Gender: F               HR:           74 bpm. Exam Location:  [facility] Procedure: 2D Echo, Cardiac Doppler and Color Doppler Indications:     Shock  History:         Patient has no prior history of Echocardiogram examinations.                  Risk Factors:Hypertension. Other bipolar disorder, tobacco                  abuse.  Sonographer:     Sherrie Sport RDCS (AE) Referring Phys:  [id number] Bradly Bienenstock Diagnosing Phys: Ida Rogue MD  Sonographer Comments: Technically difficult study due to poor echo windows, no parasternal window and no subcostal window. IMPRESSIONS  1. Left ventricular ejection fraction, by estimation, is 60 to 65%. The left ventricle has normal function. The left ventricle has no regional wall motion abnormalities. Left ventricular diastolic parameters were normal.  2. Right ventricular systolic function is normal. The right ventricular size is normal. There is normal pulmonary artery systolic pressure.  3. The mitral valve is normal in structure. Mild mitral valve regurgitation. No evidence of mitral stenosis.  FINDINGS  Left Ventricle: Left ventricular ejection fraction, by estimation, is 60 to 65%. The left ventricle has normal function. The left ventricle has no regional wall  motion abnormalities. The left ventricular internal cavity size was normal in size. There is  no left ventricular hypertrophy. Left ventricular diastolic parameters were normal. Right Ventricle: The right ventricular size is normal. No increase in right ventricular wall thickness. Right ventricular systolic function is normal. There is normal pulmonary artery systolic pressure. The tricuspid regurgitant velocity is 2.13 m/s, and  with an assumed right atrial pressure of 10 mmHg, the estimated right ventricular systolic pressure is 78.6 mmHg. Left Atrium: Left atrial size was normal in size. Right Atrium: Right atrial size was normal in size. Pericardium: There is no evidence of pericardial effusion. Mitral Valve: The mitral valve is normal in structure. Normal mobility of the mitral valve leaflets. Mild mitral valve regurgitation. No evidence of mitral valve stenosis. Tricuspid Valve: The tricuspid valve is normal in structure. Tricuspid valve regurgitation is not demonstrated. No evidence of tricuspid stenosis. Aortic Valve: The aortic valve is normal in structure. Aortic valve regurgitation is not visualized. No aortic stenosis is present. Aortic valve mean gradient measures 4.5 mmHg. Aortic valve peak gradient measures 8.4 mmHg. Aortic valve area, by VTI measures 3.26 cm. Pulmonic Valve: The pulmonic valve was normal in structure. Pulmonic valve regurgitation is not visualized. No evidence of pulmonic stenosis. Aorta: The aortic root is normal in size and structure. Venous: The inferior vena cava is normal in size with greater than 50% respiratory variability, suggesting right atrial pressure of 3 mmHg. IAS/Shunts: No atrial level shunt detected by color flow Doppler.  LEFT VENTRICLE PLAX 2D LVIDd:         2.99 cm  Diastology LVIDs:          2.10 cm  LV e' lateral:   13.30 cm/s LV PW:         1.11 cm  LV E/e' lateral: 7.8 LV IVS:        0.84 cm  LV e' medial:    7.18 cm/s LVOT diam:     2.00 cm  LV E/e' medial:  14.5 LV SV:         96 LV SV Index:   53 LVOT Area:     3.14 cm  RIGHT VENTRICLE RV Basal diam:  3.01 cm RV S prime:     12.90 cm/s TAPSE (M-mode): 3.2 cm LEFT ATRIUM           Index       RIGHT ATRIUM          Index LA diam:      3.50 cm 1.92 cm/m  RA Area:     9.20 cm LA Vol (A4C): 37.3 ml 20.46 ml/m RA Volume:   17.70 ml 9.71 ml/m  AORTIC VALVE AV Area (Vmax):    2.74 cm AV Area (Vmean):   2.67 cm AV Area (VTI):     3.26 cm AV Vmax:           144.50 cm/s AV Vmean:          100.100 cm/s AV VTI:            0.294 m AV Peak Grad:      8.4 mmHg AV Mean Grad:      4.5 mmHg LVOT Vmax:         126.00 cm/s LVOT Vmean:        85.000 cm/s LVOT VTI:          0.305 m LVOT/AV VTI ratio: 1.04 MITRAL VALVE  TRICUSPID VALVE MV Area (PHT): 3.00 cm     TR Peak grad:   18.1 mmHg MV Decel Time: 253 msec     TR Vmax:        213.00 cm/s MV E velocity: 104.00 cm/s MV A velocity: 72.00 cm/s   SHUNTS MV E/A ratio:  1.44         Systemic VTI:  0.30 m                             Systemic Diam: 2.00 cm Ida Rogue MD Electronically signed by Ida Rogue MD Signature Date/Time: 12/26/2019/6:34:44 PM    Final    Medications: I have reviewed the patient's current medications. Scheduled Meds: . lactulose  30 g Oral TID  . multivitamin with minerals  1 tablet Oral Daily  . pantoprazole (PROTONIX) IV  40 mg Intravenous Q12H  . predniSONE  40 mg Oral Daily  . sodium chloride  2 g Oral TID WC   Continuous Infusions: . sodium chloride Stopped (12/27/19 2318)  . cefTRIAXone (ROCEPHIN)  IV 2 g (12/28/19 0819)  . metronidazole 500 mg (12/28/19 0520)   PRN Meds:.oxyCODONE   Assessment: Principal Problem:   Decompensated liver disease ([place]) Active Problems:   Blood in stool   Alcoholic steatohepatitis   Hypokalemia    Hyperbilirubinemia   Elevated LFTs   Hyponatremia   Portal hypertension ([place])  Katherine Goodwin 59 y.o. female admitted end of June with alcoholic hepatitis and D.c on 6/27. T bil at that time was 23.9 . Admitted with Melena . EGD 12/27/2019 showed PHG and no varices . MELD 29  . Started on Steroids for alcoholic hepatitis on 0/02/8118 Hb stable. H/o ascites -transudate , no SBP on 12/27/2019. Viral hepattiis panel has been negative in 11/2019 , presently has hepatic encephelopathy   Plan: 1. Continue supportive therapy with good nutrition . 2. With a recent history of GI bleed , use of glucocorticoids can increase the risk of bleeding . Balancing the risks of bleeding with the benefits of steroids . I would recommend stopping the steroids.  3. Low sodium diet  4. Continue lactulose +xifaxan for hepatic encephalopathy ( I have added xifaxan today ) 5. Correct low electrolytes  6. Complete course of antibiotics.  7. Avoid narcotics as it may precipitate hepatic encephelopathy  8. D/c salt tablets as it will worsen ascites. Usually in cirrhotics they have a hyper aldosteronism and have a dilution hyponatremia . Consider starting on Aldactone 100 mg a day which would help the sodium and low potassium , can increase aldactone to 200 mg in 48 hours if BMP permits    LOS: 2 days   Jonathon Bellows, MD 12/28/2019, 10:41 AM

## 2019-12-28 NOTE — Plan of Care (Signed)
Continuing with plan of care. 

## 2019-12-29 LAB — COMPREHENSIVE METABOLIC PANEL
ALT: 39 U/L (ref 0–44)
AST: 114 U/L — ABNORMAL HIGH (ref 15–41)
Albumin: 2.3 g/dL — ABNORMAL LOW (ref 3.5–5.0)
Alkaline Phosphatase: 333 U/L — ABNORMAL HIGH (ref 38–126)
Anion gap: 9 (ref 5–15)
BUN: 25 mg/dL — ABNORMAL HIGH (ref 6–20)
CO2: 17 mmol/L — ABNORMAL LOW (ref 22–32)
Calcium: 8.2 mg/dL — ABNORMAL LOW (ref 8.9–10.3)
Chloride: 106 mmol/L (ref 98–111)
Creatinine, Ser: 0.83 mg/dL (ref 0.44–1.00)
GFR calc Af Amer: >60 mL/min (ref >60)
GFR calc non Af Amer: >60 mL/min (ref >60)
Glucose, Bld: 105 mg/dL — ABNORMAL HIGH (ref 70–99)
Potassium: 3.3 mmol/L — ABNORMAL LOW (ref 3.5–5.1)
Sodium: 132 mmol/L — ABNORMAL LOW (ref 135–145)
Total Bilirubin: 19.9 mg/dL (ref 0.3–1.2)
Total Protein: 5.5 g/dL — ABNORMAL LOW (ref 6.5–8.1)

## 2019-12-29 LAB — PROTIME-INR
INR: 1.5 — ABNORMAL HIGH (ref 0.8–1.2)
Prothrombin Time: 17.4 seconds — ABNORMAL HIGH (ref 11.4–15.2)

## 2019-12-29 LAB — CBC
HCT: 26.6 % — ABNORMAL LOW (ref 36.0–46.0)
Hemoglobin: 9.3 g/dL — ABNORMAL LOW (ref 12.0–15.0)
MCH: 36.2 pg — ABNORMAL HIGH (ref 26.0–34.0)
MCHC: 35 g/dL (ref 30.0–36.0)
MCV: 103.5 fL — ABNORMAL HIGH (ref 80.0–100.0)
Platelets: 149 10*3/uL — ABNORMAL LOW (ref 150–400)
RBC: 2.57 MIL/uL — ABNORMAL LOW (ref 3.87–5.11)
RDW: 14.9 % (ref 11.5–15.5)
WBC: 21.2 10*3/uL — ABNORMAL HIGH (ref 4.0–10.5)
nRBC: 0.1 % (ref 0.0–0.2)

## 2019-12-29 LAB — PROTEIN, BODY FLUID (OTHER): Total Protein, Body Fluid Other: 0.7 g/dL

## 2019-12-29 LAB — HEMOGLOBIN AND HEMATOCRIT, BLOOD
HCT: 27.8 % — ABNORMAL LOW (ref 36.0–46.0)
HCT: 28.1 % — ABNORMAL LOW (ref 36.0–46.0)
HCT: 28 % — ABNORMAL LOW (ref 36.0–46.0)
Hemoglobin: 9.8 g/dL — ABNORMAL LOW (ref 12.0–15.0)
Hemoglobin: 9.5 g/dL — ABNORMAL LOW (ref 12.0–15.0)
Hemoglobin: 9.5 g/dL — ABNORMAL LOW (ref 12.0–15.0)

## 2019-12-29 LAB — AMMONIA: Ammonia: 48 umol/L — ABNORMAL HIGH (ref 9–35)

## 2019-12-29 LAB — APTT: aPTT: 38 seconds — ABNORMAL HIGH (ref 24–36)

## 2019-12-29 LAB — MAGNESIUM: Magnesium: 2 mg/dL (ref 1.7–2.4)

## 2019-12-29 MED ORDER — LIDOCAINE 5 % EX PTCH
2.0000 | MEDICATED_PATCH | CUTANEOUS | Status: DC
  Administered 2019-12-29 – 2019-12-30 (×2): 2 via TRANSDERMAL
  Filled 2019-12-29 (×3): qty 2

## 2019-12-29 MED ORDER — CIPROFLOXACIN IN D5W 400 MG/200ML IV SOLN
400.0000 mg | Freq: Two times a day (BID) | INTRAVENOUS | Status: DC
  Administered 2019-12-29 – 2019-12-30 (×2): 400 mg via INTRAVENOUS
  Filled 2019-12-29 (×3): qty 200

## 2019-12-29 MED ORDER — MAGNESIUM SULFATE 2 GM/50ML IV SOLN
2.0000 g | Freq: Once | INTRAVENOUS | Status: AC
  Administered 2019-12-29: 2 g via INTRAVENOUS
  Filled 2019-12-29: qty 50

## 2019-12-29 MED ORDER — PANTOPRAZOLE SODIUM 40 MG PO TBEC
40.0000 mg | DELAYED_RELEASE_TABLET | Freq: Two times a day (BID) | ORAL | Status: DC
  Administered 2019-12-29 – 2019-12-31 (×5): 40 mg via ORAL
  Filled 2019-12-29 (×5): qty 1

## 2019-12-29 MED ORDER — POTASSIUM CHLORIDE CRYS ER 20 MEQ PO TBCR
40.0000 meq | EXTENDED_RELEASE_TABLET | Freq: Once | ORAL | Status: AC
  Administered 2019-12-29: 40 meq via ORAL
  Filled 2019-12-29: qty 2

## 2019-12-29 NOTE — Progress Notes (Signed)
[place] [facility] [place] NAME: Katherine Goodwin    MR#:  [id number]  DATE OF BIRTH:  1961-01-16  SUBJECTIVE:  CHIEF COMPLAINT:   Chief Complaint  Patient presents with  . Leg Swelling  Wants to go home.  Tired of being here. REVIEW OF SYSTEMS:  Review of Systems  Constitutional: Positive for malaise/fatigue. Negative for diaphoresis, fever and weight loss.  HENT: Negative for ear discharge, ear pain, hearing loss, nosebleeds, sore throat and tinnitus.   Eyes: Negative for blurred vision and pain.  Respiratory: Negative for cough, hemoptysis, shortness of breath and wheezing.   Cardiovascular: Negative for chest pain, palpitations, orthopnea and leg swelling.  Gastrointestinal: Negative for abdominal pain, blood in stool, constipation, diarrhea, heartburn, nausea and vomiting.  Genitourinary: Negative for dysuria, frequency and urgency.  Musculoskeletal: Negative for back pain and myalgias.  Skin: Negative for itching and rash.  Neurological: Negative for dizziness, tingling, tremors, focal weakness, seizures, weakness and headaches.  Psychiatric/Behavioral: Negative for depression. The patient is not nervous/anxious.     DRUG ALLERGIES:   Allergies  Allergen Reactions  . Prozac [Fluoxetine Hcl] Other (See Comments)    Homicidal and suicidal thoughts  . Seroquel [Quetiapine Fumarate] Other (See Comments)    Homicidal and suicidal thoughts  . Sulfa Antibiotics Nausea And Vomiting   VITALS:  Blood pressure 116/68, pulse 74, temperature (!) 97.4 F (36.3 C), temperature source Oral, resp. rate 20, height '5\' 5"'  (1.651 m), weight 74.3 kg, SpO2 97 %. PHYSICAL EXAMINATION:  Physical Exam HENT:     Head: Normocephalic and atraumatic.  Eyes:     General: Scleral icterus present.     Conjunctiva/sclera: Conjunctivae normal.     Pupils: Pupils are equal, round, and reactive to light.  Neck:     Thyroid: No thyromegaly.     Trachea: No tracheal  deviation.  Cardiovascular:     Rate and Rhythm: Normal rate and regular rhythm.     Heart sounds: Normal heart sounds.  Pulmonary:     Effort: Pulmonary effort is normal. No respiratory distress.     Breath sounds: Normal breath sounds. No wheezing.  Chest:     Chest wall: No tenderness.  Abdominal:     General: Bowel sounds are normal.     Palpations: Abdomen is soft.     Tenderness: There is no abdominal tenderness.  Musculoskeletal:        General: Normal range of motion.     Cervical back: Normal range of motion and neck supple.  Skin:    General: Skin is warm and dry.     Coloration: Skin is jaundiced.     Findings: No rash.  Neurological:     Mental Status: She is alert and oriented to person, place, and time.     Cranial Nerves: No cranial nerve deficit.    LABORATORY PANEL:  Female CBC Recent Labs  Lab 12/29/19 0545 12/29/19 0545 12/29/19 1137  WBC 21.2*  --   --   HGB 9.3*   < > 9.8*  HCT 26.6*   < > 27.8*  PLT 149*  --   --    < > = values in this interval not displayed.   ------------------------------------------------------------------------------------------------------------------ Chemistries  Recent Labs  Lab 12/28/19 0541 12/29/19 0545 12/29/19 1137  NA  --  132*  --   K  --  3.3*  --   CL  --  106  --  CO2  --  17*  --   GLUCOSE  --  105*  --   BUN  --  25*  --   CREATININE  --  0.83  --   CALCIUM  --  8.2*  --   MG   < >  --  2.0  AST  --  114*  --   ALT  --  39  --   ALKPHOS  --  333*  --   BILITOT  --  19.9*  --    < > = values in this interval not displayed.   RADIOLOGY:  No results found. ASSESSMENT AND PLAN:  59 year old female with a known history of ongoing alcohol abuse, severe hepatic steatosis, hypertension, asthma, bipolar disorder admitted for decompensated liver disease with bright red blood per rectum  Decompensated liver disease with hyperbilirubinemia/transaminitis Right upper quadrant ultrasound showing  cholelithiasis and gallbladder sludge without evidence of acute cholecystitis, CBD 4.7 mm, fatty liver MRI not showing any other acute pathology GI consult appreciated Total bilirubin 23.9-> 19.9 Considering recent history of GI bleed GI recommended to stop steroids -done on 7/10.  Was initially started due to high meld score. Hepatic Function Latest Ref Rng & Units 12/29/2019 12/28/2019 12/27/2019  Total Protein 6.5 - 8.1 g/dL 5.5(L) 5.4(L) 5.4(L)  Albumin 3.5 - 5.0 g/dL 2.3(L) 2.3(L) 2.4(L)  AST 15 - 41 U/L 114(H) 137(H) 145(H)  ALT 0 - 44 U/L 39 40 39  Alk Phosphatase 38 - 126 U/L 333(H) 317(H) 287(H)  Total Bilirubin 0.3 - 1.2 mg/dL 19.9(HH) 22.2(HH) 22.3(HH)  Bilirubin, Direct 0.0 - 0.2 mg/dL - - -   Hypotension: Present on admission requiring pressors.  Improved now Echo within normal limits, US guided paracentesis with removal of 1.7 L of fluid Empiric Rocephin + Flagyl  Acute blood loss anemia  bright red blood per rectum  IV Protonix 40 mg twice daily for now Hemoglobin 9.2 (10.2 on admission) Monitor H&H daily FOBT positive in ED Transfuse for hemoglobin less than 8 Status post EGD on 7/9 showing no active bleeder.  Acute kidney injury Likely ATN from hypotension Improving with hydration, creatinine 1.1  Hepatic encephalopathy Monitor, ammonia 87->48 Continue lactulose and Xifaxan (started 7/10 per GI) Avoid narcotics  Hyponatremia/hypokalemia Replete and recheck, check magnesium Sodium improving with hydration, improving oral intake Started Aldactone 100 mg daily on 7/10.  Can increase this to 200 mg on 7/12 as appropriate  Ascites Due to underlying liver disease Started Aldactone per GI  Overall prognosis poor.  Palliative care consultation for goals of care discussion  Covid is negative  Body mass index is 27.26 kg/m.    I do not think patient is safe to be discharged home yet.  I have explained this to patient's husband over phone who is in agreement and  would like to keep her here I also try to reach patient's stepdaughter destiny but had no luck  Status is: Inpatient  Remains inpatient appropriate because:Inpatient level of care appropriate due to severity of illness   Dispo: The patient is from: Home              Anticipated d/c is to: Home              Anticipated d/c date is: 3 days              Patient currently is not medically stable to d/c.  Patient is still very jaundiced with elevated ammonia and liver function test.  Although  slowly improving    DVT prophylaxis:            SCDs Start: 12/26/19 0053     Family Communication: Updated patient's husband over phone today on 7/11   All the records are reviewed and case discussed with Care Management/Social Worker. Management plans discussed with the patient, nursing and they are in agreement.  CODE STATUS: Full Code  TOTAL TIME TAKING CARE OF THIS PATIENT: 35 minutes.   More than 50% of the time was spent in counseling/coordination of care: YES  POSSIBLE D/C IN 1-2 DAYS, DEPENDING ON CLINICAL CONDITION.   Max Sane M.D on 12/29/2019 at 2:40 PM  [facility]   CC: Primary care physician; Glean Hess, MD  Note: This dictation was prepared with Dragon dictation along with smaller phrase technology. Any transcriptional errors that result from this process are unintentional.

## 2019-12-29 NOTE — Progress Notes (Signed)
Patient has refused her doses of lactulose today.  Dr. Manuella Ghazi notified of refusal.

## 2019-12-29 NOTE — Plan of Care (Signed)
Continuing with plan of care. 

## 2019-12-30 DIAGNOSIS — E871 Hypo-osmolality and hyponatremia: Secondary | ICD-10-CM

## 2019-12-30 DIAGNOSIS — E7220 Disorder of urea cycle metabolism, unspecified: Secondary | ICD-10-CM

## 2019-12-30 DIAGNOSIS — Z515 Encounter for palliative care: Secondary | ICD-10-CM

## 2019-12-30 DIAGNOSIS — K7460 Unspecified cirrhosis of liver: Secondary | ICD-10-CM

## 2019-12-30 DIAGNOSIS — K7031 Alcoholic cirrhosis of liver with ascites: Secondary | ICD-10-CM

## 2019-12-30 DIAGNOSIS — Z789 Other specified health status: Secondary | ICD-10-CM

## 2019-12-30 DIAGNOSIS — R531 Weakness: Secondary | ICD-10-CM

## 2019-12-30 DIAGNOSIS — Z7189 Other specified counseling: Secondary | ICD-10-CM

## 2019-12-30 LAB — CBC
HCT: 26.1 % — ABNORMAL LOW (ref 36.0–46.0)
Hemoglobin: 9.2 g/dL — ABNORMAL LOW (ref 12.0–15.0)
MCH: 35.2 pg — ABNORMAL HIGH (ref 26.0–34.0)
MCHC: 35.2 g/dL (ref 30.0–36.0)
MCV: 100 fL (ref 80.0–100.0)
Platelets: 122 10*3/uL — ABNORMAL LOW (ref 150–400)
RBC: 2.61 MIL/uL — ABNORMAL LOW (ref 3.87–5.11)
RDW: 15.4 % (ref 11.5–15.5)
WBC: 17.9 10*3/uL — ABNORMAL HIGH (ref 4.0–10.5)
nRBC: 0 % (ref 0.0–0.2)

## 2019-12-30 LAB — COMPREHENSIVE METABOLIC PANEL
ALT: 35 U/L (ref 0–44)
AST: 96 U/L — ABNORMAL HIGH (ref 15–41)
Albumin: 2.1 g/dL — ABNORMAL LOW (ref 3.5–5.0)
Alkaline Phosphatase: 292 U/L — ABNORMAL HIGH (ref 38–126)
Anion gap: 3 — ABNORMAL LOW (ref 5–15)
BUN: 25 mg/dL — ABNORMAL HIGH (ref 6–20)
CO2: 18 mmol/L — ABNORMAL LOW (ref 22–32)
Calcium: 7.9 mg/dL — ABNORMAL LOW (ref 8.9–10.3)
Chloride: 107 mmol/L (ref 98–111)
Creatinine, Ser: 0.91 mg/dL (ref 0.44–1.00)
GFR calc Af Amer: >60 mL/min (ref >60)
GFR calc non Af Amer: >60 mL/min (ref >60)
Glucose, Bld: 87 mg/dL (ref 70–99)
Potassium: 3.5 mmol/L (ref 3.5–5.1)
Sodium: 128 mmol/L — ABNORMAL LOW (ref 135–145)
Total Bilirubin: 16.9 mg/dL — ABNORMAL HIGH (ref 0.3–1.2)
Total Protein: 5.2 g/dL — ABNORMAL LOW (ref 6.5–8.1)

## 2019-12-30 LAB — PH, BODY FLUID: pH, Body Fluid: 7.5

## 2019-12-30 LAB — BODY FLUID CULTURE: Culture: NO GROWTH

## 2019-12-30 LAB — CYTOLOGY - NON PAP

## 2019-12-30 MED ORDER — ADULT MULTIVITAMIN W/MINERALS CH: 1.0000 | ORAL_TABLET | Freq: Every day | ORAL | Status: DC

## 2019-12-30 NOTE — Care Management (Signed)
Outpatient palliative referral accepted by Santiago Glad with [employer].  PT eval pending. RNCM follow for recommendations

## 2019-12-30 NOTE — Progress Notes (Signed)
[facility] Liaison note:  New referral for [employer] community Palliative program to follow at home received from Palliative NP Elmer Picker. TOC Stephanie Bowen aware.  Patient information given to referral. Thank you. Flo Shanks BSN, RN, [place] [phone]

## 2019-12-30 NOTE — Progress Notes (Signed)
MEDICATION RELATED NOTE  Pharmacy Note for lamotrigine Indication: seizures  Patient Measurements: Height: 5\' 5"  (165.1 cm) Weight: 74.5 kg (164 lb 3.9 oz) IBW/kg (Calculated) : 57  Vital Signs: Temp: 97.5 F (36.4 C) (07/12 1223) Temp Source: Oral (07/12 0313) BP: 105/61 (07/12 1223) Pulse Rate: 78 (07/12 1223) Intake/Output from previous day: 07/11 0701 - 07/12 0700 In: 969.1 [P.O.:360; I.V.:10; IV Piggyback:599.1] Out: -  Intake/Output from this shift: Total I/O In: 300 [IV Piggyback:300] Out: -   Labs: Recent Labs    12/28/19 0541 12/28/19 1102 12/29/19 0545 12/29/19 0545 12/29/19 1137 12/29/19 1137 12/29/19 1711 12/29/19 2309 12/30/19 0536  WBC 14.6*  --  21.2*  --   --   --   --   --  17.9*  HGB 9.2*   < > 9.3*   < > 9.8*   < > 9.5* 9.5* 9.2*  HCT 25.6*   < > 26.6*   < > 27.8*   < > 28.1* 28.0* 26.1*  PLT 138*  --  149*  --   --   --   --   --  122*  APTT 39*  --  38*  --   --   --   --   --   --   CREATININE 0.86  --  0.83  --   --   --   --   --  0.91  MG 1.6*  --   --   --  2.0  --   --   --   --   ALBUMIN 2.3*  --  2.3*  --   --   --   --   --  2.1*  PROT 5.4*  --  5.5*  --   --   --   --   --  5.2*  AST 137*  --  114*  --   --   --   --   --  96*  ALT 40  --  39  --   --   --   --   --  35  ALKPHOS 317*  --  333*  --   --   --   --   --  292*  BILITOT 22.2*  --  19.9*  --   --   --   --   --  16.9*   < > = values in this interval not displayed.   Estimated Creatinine Clearance: 68.9 mL/min (by C-G formula based on SCr of 0.91 mg/dL).   Microbiology: Recent Results (from the past 720 hour(s))  SARS Coronavirus 2 by RT PCR (hospital order, performed in [facility] hospital lab) Nasopharyngeal Nasopharyngeal Swab     Status: None   Collection Time: 12/13/19 12:58 AM   Specimen: Nasopharyngeal Swab  Result Value Ref Range Status   SARS Coronavirus 2 NEGATIVE NEGATIVE Final    Comment: (NOTE) SARS-CoV-2 target nucleic acids are NOT DETECTED.  The  SARS-CoV-2 RNA is generally detectable in upper and lower respiratory specimens during the acute phase of infection. The lowest concentration of SARS-CoV-2 viral copies this assay can detect is 250 copies / mL. A negative result does not preclude SARS-CoV-2 infection and should not be used as the sole basis for treatment or other patient management decisions.  A negative result may occur with improper specimen collection / handling, submission of specimen other than nasopharyngeal swab, presence of viral mutation(s) within the areas targeted by this assay, and inadequate number of viral  copies (<250 copies / mL). A negative result must be combined with clinical observations, patient history, and epidemiological information.  Fact Sheet for Patients:   StrictlyIdeas.no  Fact Sheet for Healthcare Providers: BankingDealers.co.za  This test is not yet approved or  cleared by the [place] FDA and has been authorized for detection and/or diagnosis of SARS-CoV-2 by FDA under an Emergency Use Authorization (EUA).  This EUA will remain in effect (meaning this test can be used) for the duration of the COVID-19 declaration under Section 564(b)(1) of the Act, 21 U.S.C. section 360bbb-3(b)(1), unless the authorization is terminated or revoked sooner.  Performed at [facility], [address] [place], [place] [address]   Gastrointestinal Panel by PCR , Stool     Status: None   Collection Time: 12/25/19  9:43 PM   Specimen: STOOL  Result Value Ref Range Status   Campylobacter species NOT DETECTED NOT DETECTED Final   Plesimonas shigelloides NOT DETECTED NOT DETECTED Final   Salmonella species NOT DETECTED NOT DETECTED Final   Yersinia enterocolitica NOT DETECTED NOT DETECTED Final   Vibrio species NOT DETECTED NOT DETECTED Final   Vibrio cholerae NOT DETECTED NOT DETECTED Final   Enteroaggregative E coli (EAEC) NOT DETECTED NOT  DETECTED Final   Enteropathogenic E coli (EPEC) NOT DETECTED NOT DETECTED Final   Enterotoxigenic E coli (ETEC) NOT DETECTED NOT DETECTED Final   Shiga like toxin producing E coli (STEC) NOT DETECTED NOT DETECTED Final   Shigella/Enteroinvasive E coli (EIEC) NOT DETECTED NOT DETECTED Final   Cryptosporidium NOT DETECTED NOT DETECTED Final   Cyclospora cayetanensis NOT DETECTED NOT DETECTED Final   Entamoeba histolytica NOT DETECTED NOT DETECTED Final   Giardia lamblia NOT DETECTED NOT DETECTED Final   Adenovirus F40/41 NOT DETECTED NOT DETECTED Final   Astrovirus NOT DETECTED NOT DETECTED Final   Norovirus GI/GII NOT DETECTED NOT DETECTED Final   Rotavirus A NOT DETECTED NOT DETECTED Final   Sapovirus (I, II, IV, and V) NOT DETECTED NOT DETECTED Final    Comment: Performed at [facility], [address] [place], [place] [address]  C Difficile Quick Screen w PCR reflex     Status: Abnormal   Collection Time: 12/25/19  9:43 PM   Specimen: STOOL  Result Value Ref Range Status   C Diff antigen POSITIVE (A) NEGATIVE Final   C Diff toxin NEGATIVE NEGATIVE Final   C Diff interpretation Results are indeterminate. See PCR results.  Final    Comment: Performed at [facility], [address] [place], [place] [address]  C. Diff by PCR, Reflexed     Status: None   Collection Time: 12/25/19  9:43 PM  Result Value Ref Range Status   Toxigenic C. Difficile by PCR NEGATIVE NEGATIVE Final    Comment: Patient is colonized with non toxigenic C. difficile. May not need treatment unless significant symptoms are present. Performed at [facility], [address] [place], [place] [address]   CULTURE, BLOOD (ROUTINE X 2) w Reflex to ID Panel     Status: None (Preliminary result)   Collection Time: 12/26/19  5:51 AM   Specimen: BLOOD LEFT FOREARM  Result Value Ref Range Status   Specimen Description BLOOD LEFT FOREARM  Final   Special Requests   Final    BOTTLES DRAWN  AEROBIC AND ANAEROBIC Blood Culture adequate volume   Culture   Final    NO GROWTH 4 DAYS Performed at [facility], [address],  [place], [place] [address]    Report Status PENDING  Incomplete  CULTURE, BLOOD (ROUTINE X 2) w Reflex to ID Panel     Status: None (Preliminary result)   Collection Time: 12/26/19  5:51 AM   Specimen: BLOOD RIGHT HAND  Result Value Ref Range Status   Specimen Description BLOOD RIGHT HAND  Final   Special Requests   Final    BOTTLES DRAWN AEROBIC AND ANAEROBIC Blood Culture adequate volume   Culture   Final    NO GROWTH 4 DAYS Performed at [facility], [address] [place], [place] [address]    Report Status PENDING  Incomplete  SARS Coronavirus 2 by RT PCR (hospital order, performed in [place] hospital lab) Nasopharyngeal Nasopharyngeal Swab     Status: None   Collection Time: 12/26/19 11:58 AM   Specimen: Nasopharyngeal Swab  Result Value Ref Range Status   SARS Coronavirus 2 NEGATIVE NEGATIVE Final    Comment: (NOTE) SARS-CoV-2 target nucleic acids are NOT DETECTED.  The SARS-CoV-2 RNA is generally detectable in upper and lower respiratory specimens during the acute phase of infection. The lowest concentration of SARS-CoV-2 viral copies this assay can detect is 250 copies / mL. A negative result does not preclude SARS-CoV-2 infection and should not be used as the sole basis for treatment or other patient management decisions.  A negative result may occur with improper specimen collection / handling, submission of specimen other than nasopharyngeal swab, presence of viral mutation(s) within the areas targeted by this assay, and inadequate number of viral copies (<250 copies / mL). A negative result must be combined with clinical observations, patient history, and epidemiological information.  Fact Sheet for Patients:   StrictlyIdeas.no  Fact Sheet for Healthcare  Providers: BankingDealers.co.za  This test is not yet approved or  cleared by the [place] FDA and has been authorized for detection and/or diagnosis of SARS-CoV-2 by FDA under an Emergency Use Authorization (EUA).  This EUA will remain in effect (meaning this test can be used) for the duration of the COVID-19 declaration under Section 564(b)(1) of the Act, 21 U.S.C. section 360bbb-3(b)(1), unless the authorization is terminated or revoked sooner.  Performed at [facility], [address] [place], [place] [address]   Body fluid culture     Status: None   Collection Time: 12/27/19 12:30 PM   Specimen: PATH Cytology Peritoneal fluid  Result Value Ref Range Status   Specimen Description   Final    PERITONEAL Performed at [facility], [address], [place], [place] [address]    Special Requests   Final    PERITONEAL Performed at [facility], [address] [place], [place] [address]    Gram Stain   Final    RARE WBC PRESENT, PREDOMINANTLY MONONUCLEAR NO ORGANISMS SEEN    Culture   Final    NO GROWTH 3 DAYS Performed at [facility], [place] [address], [place], [place] [address]    Report Status 12/30/2019 FINAL  Final  Anaerobic culture     Status: None (Preliminary result)   Collection Time: 12/27/19 12:30 PM   Specimen: Peritoneal Washings  Result Value Ref Range Status   Specimen Description PERITONEAL  Final   Special Requests   Final    NONE Performed at [facility], [place] [address], [place],  [address]    Culture   Final    NO ANAEROBES ISOLATED; CULTURE IN PROGRESS FOR 5 DAYS  Report Status PENDING  Incomplete    Medical History: Past Medical History:  Diagnosis Date  . Asthma   . Depression   . GERD (gastroesophageal reflux disease)   . Hypertension   . Kidney stones   . Osteoarthrosis, unspecified whether generalized or localized, involving lower leg    leg  . Other bipolar  disorders   . Seizure ([place])    none since age 4  . Tobacco abuse   . Trigger finger   . Wears dentures    partial upper    Medications:  Scheduled:  . lactulose  30 g Oral TID  . lidocaine  2 patch Transdermal Q24H  . multivitamin with minerals  1 tablet Oral Daily  . pantoprazole  40 mg Oral BID  . rifaximin  550 mg Oral BID  . spironolactone  100 mg Oral Daily    Assessment/Plan: 59 year old female with a  history of ongoing alcohol abuse, severe hepatic steatosis, seizures, hypertension, asthma, bipolar disorder admitted for decompensated liver disease with bright red blood per rectum.   Because of the history of seizures and being on lamotrigine in the past to treat this condition I spoke with the patient who confirms she has not taken lamotrigine since December 2020 and has had no seizures since. The reason for stopping the medication is not clear. She reports no side-effects and no issues affording the medication. I confirmed that the last dispense date is May 23 2019 with her pharmacy which is [facility] on [address]. The medication remains not on her medication reconciliation due to non-compliance.   Katherine Goodwin 12/30/2019,2:16 PM

## 2019-12-30 NOTE — Progress Notes (Signed)
Daily Progress Note   Patient Name: HENNESSY BARTEL       Date: 12/30/2019 DOB: 10/21/1960  Age: 59 y.o. MRN#: [id number] Attending Physician: Fritzi Mandes, MD Primary Care Physician: Glean Hess, MD Admit Date: 12/25/2019  Reason for Consultation/Follow-up: Disposition and Establishing goals of care  Subjective: Chart review completed and received report from primary RN. RN had no acute concerns.  Went to visit patient at bedside - no family present. Patient was awake, alert, and oriented. She was able to participate in conversation.   [place] conversation was continued - her goal is still to try and regain her functionality so she can discharge home. She is willing and ready to work with PT. Dr. Posey Pronto visited [name]'s room during visit and updated her on the plan of care - she will have PT work with her and possibly discharge tomorrow. Outpatient Palliative was offered and Ms. Bernita Buffy was agreeable.  Discussion and education was had around advanced directives. Ms. Bernita Buffy is not interested in completing a living will or HCPOA while she is here. She did state that she is ok with her husband/Julio to make decisions on her behalf if needed. MOST form and  DNR/DNI vs full code education was provided and discussion was had - she requested time to further review the MOST form and consider code status before making any decisions. Explained that currently, she is considered a full code - she is ok with full code status at this time. Left blank MOST form at bedside for her review.  Addressed all questions and concerns. Encouraged to call with questions and/or concerns. PMT card was provided.  Length of Stay: 4  Current Medications: Scheduled Meds:  . lactulose  30 g Oral TID  . lidocaine  2 patch Transdermal  Q24H  . multivitamin with minerals  1 tablet Oral Daily  . pantoprazole  40 mg Oral BID  . rifaximin  550 mg Oral BID  . spironolactone  100 mg Oral Daily    Continuous Infusions: . sodium chloride Stopped (12/27/19 2318)    PRN Meds:   Physical Exam Pulmonary:     Effort: No respiratory distress.  Skin:    General: Skin is warm and dry.     Coloration: Skin is jaundiced.  Neurological:     Mental Status: She is alert.     Motor: Weakness present.  Psychiatric:        Mood and Affect: Mood normal.        Behavior: Behavior is cooperative.        Cognition and Memory: Cognition normal.             Vital Signs: BP (!) 116/57 (BP Location: Right Arm)   Pulse 71   Temp (!) 97.5 F (36.4 C) (Oral)   Resp 18   Ht 5\' 5"  (1.651 m)   Wt 74.5 kg   SpO2 99%   BMI 27.33 kg/m  SpO2: SpO2: 99 % O2 Device: O2 Device: Room Air O2 Flow Rate:    Intake/output summary:   Intake/Output Summary (Last 24 hours) at 12/30/2019 1137 Last data filed at 12/30/2019 0130 Gross per 24 hour  Intake 609.13 ml  Output --  Net 609.13 ml   LBM: Last BM Date: 12/29/19 Baseline Weight: Weight: 74.8 kg Most recent weight: Weight: 74.5 kg       Palliative Assessment/Data: PPS 70%    Flowsheet Rows     Most Recent Value  Intake Tab  Referral Department Hospitalist  Unit at Time of Referral ER  Palliative Care Primary Diagnosis Other (Comment)  [Decompensated liver disease ]  Date Notified 12/27/19  Palliative Care Type New Palliative care  Reason for referral Clarify Goals of Care  Date of Admission 12/25/19  Date first seen by Palliative Care 12/27/19  # of days Palliative referral response time 0 Day(s)  # of days IP prior to Palliative referral 2  Clinical Assessment  Psychosocial & Spiritual Assessment  Palliative Care Outcomes      Patient Active Problem List   Diagnosis Date Noted  . Hyperbilirubinemia 12/26/2019  . Elevated LFTs 12/26/2019  . Hyponatremia 12/26/2019   . Decompensated liver disease ([place]) 12/26/2019  . Portal hypertension ([place]) 12/26/2019  . Acute hepatic encephalopathy 12/13/2019  . Alcoholic ketoacidosis 77/82/4235  . Alcoholic steatohepatitis 36/14/4315  . Hypokalemia 12/13/2019  . Hypomagnesemia 12/13/2019  . Alcohol use disorder, moderate, dependence ([place]) 12/13/2019  . Macrocytic anemia 12/13/2019  . Liver failure, acute 12/13/2019  . Mild asthma 09/18/2019  . Chronic venous insufficiency 04/04/2019  . Leg pain 04/04/2019  . Essential hypertension 04/04/2019  . PAD (peripheral artery disease) ([place]) 03/25/2019  . Localized edema 03/25/2019  . Tubular adenoma of colon 12/18/2018  . Family history of colonic polyps   . Duodenitis   . Gastritis without bleeding   . Blood in stool 12/03/2018  . Hepatic steatosis 12/03/2018  . Tobacco dependence 06/12/2015  . Elevated blood pressure 06/12/2015  . 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 Dizziness 10/17/2013  . Abnormal EKG 10/17/2013  . Back pain 10/17/2013  . Bipolar disorder ([place]) 10/17/2013  . Prolonged QT interval 10/17/2013  . Near syncope 10/17/2013    Palliative Care Assessment & Plan   HPI: LADAJA YUSUPOV a 59 y.o.femalewith medical history significant foralcohol use disorder with severe hepatic steatosis and recent imaging suggestive of portal hypertension with paraesophageal and proximal perigastric varices, hypertension, asthma, and bipolar disorder who presents to the ED for evaluation of increasing lower extremity edema, jaundice, weakness, and bloodin stool.  Assessment: Decompensated liver disease Hyperbilirubinemia Anemia Acute kidney injury Hepatic encephalopathy Ascites  Recommendations/Plan:  Continue current medical treatment  Continue full code status  Patient's goal is to return home - she is motivated to work with PT  Patient wants her husband/Julio to be her medical decision maker but does not want to  complete HCPOA at this time  Offered outpatient Palliative to follow once she was medically stable for discharge and she was agreeable - TOC consulted, TOC and [place] liaison notified  Complete MOST form when/if patient is ready - blank form left at bedside  Goals of Care and Additional Recommendations:  Limitations on Scope of Treatment: Full Scope Treatment  Code Status:  Full code  Prognosis:   Unable to determine  Discharge Planning:  Home with [place] was discussed with primary RN, Dr. Posey Pronto, patient, [facility], [facility] liaison  Thank you for allowing the Palliative Medicine Team to assist in the care of this patient.   Time In: 1100 Time Out: 1135 Total Time 35 minutes Prolonged Time Billed  no       Greater than 50%  of  this time was spent counseling and coordinating care related to the above assessment and plan.  Spenser Cong M. Tamala Julian, MSN, FNP-BC Palliative Medicine Team Team Phone # [phone]

## 2019-12-30 NOTE — Progress Notes (Signed)
[place] [facility] [place] NAME: Katherine Goodwin    MR#:  [id number]  DATE OF BIRTH:  01-01-1961  SUBJECTIVE:   Denies any complaints. Ate good breakfast. Appears well oriented to place in person. REVIEW OF SYSTEMS:   Review of Systems  Constitutional: Positive for malaise/fatigue. Negative for chills, fever and weight loss.  HENT: Negative for ear discharge, ear pain and nosebleeds.   Eyes: Negative for blurred vision, pain and discharge.  Respiratory: Negative for sputum production, shortness of breath, wheezing and stridor.   Cardiovascular: Negative for chest pain, palpitations, orthopnea and PND.  Gastrointestinal: Negative for abdominal pain, diarrhea, nausea and vomiting.  Genitourinary: Negative for frequency and urgency.  Musculoskeletal: Negative for back pain and joint pain.  Neurological: Positive for weakness. Negative for sensory change, speech change and focal weakness.  Psychiatric/Behavioral: Negative for depression and hallucinations. The patient is not nervous/anxious.    Tolerating Diet:yes Tolerating PT: pending  DRUG ALLERGIES:   Allergies  Allergen Reactions   Prozac [Fluoxetine Hcl] Other (See Comments)    Homicidal and suicidal thoughts   Seroquel [Quetiapine Fumarate] Other (See Comments)    Homicidal and suicidal thoughts   Sulfa Antibiotics Nausea And Vomiting    VITALS:  Blood pressure 105/61, pulse 78, temperature (!) 97.5 F (36.4 C), resp. rate 14, height 5\' 5"  (1.651 m), weight 74.5 kg, SpO2 95 %.  PHYSICAL EXAMINATION:   Physical Exam  GENERAL:  59 y.o.-year-old patient lying in the bed with no acute distress. Chronically ill. Skin jaundiced EYES: Pupils equal, round, reactive to light and accommodation. ++ scleral icterus.   HEENT: Head atraumatic, normocephalic. Oropharynx and nasopharynx clear.  NECK:  Supple, no jugular venous distention. No thyroid enlargement, no tenderness.  LUNGS: Normal  breath sounds bilaterally, no wheezing, rales, rhonchi. No use of accessory muscles of respiration.  CARDIOVASCULAR: S1, S2 normal. No murmurs, rubs, or gallops.  ABDOMEN: Soft, nontender, nondistended. Bowel sounds present. No organomegaly or mass.  EXTREMITIES: No cyanosis, clubbing or edema b/l.    NEUROLOGIC: Cranial nerves II through XII are intact. No focal Motor or sensory deficits b/l. Globally weak PSYCHIATRIC:  patient is alert and oriented x 3.  SKIN: jaundice  LABORATORY PANEL:  CBC Recent Labs  Lab 12/30/19 0536  WBC 17.9*  HGB 9.2*  HCT 26.1*  PLT 122*    Chemistries  Recent Labs  Lab 12/29/19 0545 12/29/19 1137 12/30/19 0536  NA   < >  --  128*  K   < >  --  3.5  CL   < >  --  107  CO2   < >  --  18*  GLUCOSE   < >  --  87  BUN   < >  --  25*  CREATININE   < >  --  0.91  CALCIUM   < >  --  7.9*  MG  --  2.0  --   AST   < >  --  96*  ALT   < >  --  35  ALKPHOS   < >  --  292*  BILITOT   < >  --  16.9*   < > = values in this interval not displayed.   Cardiac Enzymes No results for input(s): TROPONINI in the last 168 hours. RADIOLOGY:  No results found. ASSESSMENT AND PLAN:  59 year old female with a known history of ongoing alcohol abuse, severe hepatic steatosis, hypertension, asthma, bipolar  disorder admitted for decompensated liver disease with bright red blood per rectum  Decompensated liver disease with hyperbilirubinemia/transaminitis secondary to alcohol abuse -Right upper quadrant ultrasound showing cholelithiasis and gallbladder sludge without evidence of acute cholecystitis, CBD 4.7 mm, fatty liver -MRI not showing any other acute pathology -GI consult appreciated--Dr Vicente Males -Total bilirubin 23.9-> 19.9--16.9 -Considering recent history of GI bleed GI recommended to stop steroids -done on 7/10.  Was initially started due to high MELD score -patient understands she has end-stage liver disease and highly recommend stop drinking  alcohol.  Hypotension: Present on admission requiring pressors.  Improved now -Echo within normal limits -US guided paracentesis with removal of 1.7 L of fluid -Empiric Rocephin + Flagyl--d/c it--no s/o infection in peritoneal fluid  Acute blood loss anemia  bright red blood per rectum  IV Protonix 40 mg twice daily for now--change to po Hemoglobin 9.2 (10.2 on admission) Transfuse for hemoglobin less than 8 Status post EGD on 7/9 showing no active bleeder.  Acute kidney injury Likely ATN from hypotension Improving with hydration, creatinine 1.1  Hepatic encephalopathy--mentation improved Monitor, ammonia 87->48 Continue lactulose and Xifaxan (started 7/10 per GI) Avoid narcotics  Hyponatremia/hypokalemia Replete and recheck, check magnesium Sodium improving with hydration, improving oral intake Started Aldactone 100 mg daily on 7/10.  Can increase this to 200 mg in a week as appropriate Avoid SALT tablets  Ascites Due to underlying liver disease Started Aldactone per GI  Overall prognosis poor.  Palliative care consultation for goals of care discussion appreciated. Out pt palliative care to see pt  Covid is negative  Body mass index is 27.26 kg/m.  Status is: Inpatient  Remains inpatient appropriate because:Inpatient level of care appropriate due to severity of illness   Dispo: The patient is from: Home  Anticipated d/c is to: Home  Anticipated d/c date is:1-2  Patient currently is medically stable to d/c.  patient needs to be seen by physical therapy. Her labs are improving. TOC for discharge planning. Okay from G.I. standpoint for discharge.   Tried reaching daughter Katherine Goodwin on the phone times two-- call did not go through. Unable to leave voicemail for husband Mr. Katherine Goodwin voicemail full.       TOTAL TIME TAKING CARE OF THIS PATIENT: *20* minutes.  >50% time spent on counselling and coordination of  care  Note: This dictation was prepared with Dragon dictation along with smaller phrase technology. Any transcriptional errors that result from this process are unintentional.  Fritzi Mandes M.D    [facility]   CC: Primary care physician; Glean Hess, MDPatient ID: Katherine Goodwin, female   DOB: 06-May-1961, 59 y.o.   MRN: [id number]

## 2019-12-31 LAB — COMPREHENSIVE METABOLIC PANEL
ALT: 35 U/L (ref 0–44)
AST: 101 U/L — ABNORMAL HIGH (ref 15–41)
Albumin: 2 g/dL — ABNORMAL LOW (ref 3.5–5.0)
Alkaline Phosphatase: 302 U/L — ABNORMAL HIGH (ref 38–126)
Anion gap: 9 (ref 5–15)
BUN: 27 mg/dL — ABNORMAL HIGH (ref 6–20)
CO2: 17 mmol/L — ABNORMAL LOW (ref 22–32)
Calcium: 8 mg/dL — ABNORMAL LOW (ref 8.9–10.3)
Chloride: 106 mmol/L (ref 98–111)
Creatinine, Ser: 0.97 mg/dL (ref 0.44–1.00)
GFR calc Af Amer: >60 mL/min (ref >60)
GFR calc non Af Amer: >60 mL/min (ref >60)
Glucose, Bld: 87 mg/dL (ref 70–99)
Potassium: 3.3 mmol/L — ABNORMAL LOW (ref 3.5–5.1)
Sodium: 132 mmol/L — ABNORMAL LOW (ref 135–145)
Total Bilirubin: 15.8 mg/dL — ABNORMAL HIGH (ref 0.3–1.2)
Total Protein: 4.9 g/dL — ABNORMAL LOW (ref 6.5–8.1)

## 2019-12-31 LAB — CULTURE, BLOOD (ROUTINE X 2)
Culture: NO GROWTH
Culture: NO GROWTH
Special Requests: ADEQUATE
Special Requests: ADEQUATE

## 2019-12-31 LAB — TRIGLYCERIDES, BODY FLUIDS: Triglycerides, Fluid: 49 mg/dL

## 2019-12-31 MED ORDER — LACTULOSE 10 GM/15ML PO SOLN: 30.0000 g | Freq: Every day | ORAL | 3 refills | Status: AC

## 2019-12-31 MED ORDER — LIDOCAINE 5 % EX PTCH: 2.0000 | MEDICATED_PATCH | CUTANEOUS | 0 refills | Status: AC

## 2019-12-31 MED ORDER — PANTOPRAZOLE SODIUM 40 MG PO TBEC: 40.0000 mg | DELAYED_RELEASE_TABLET | Freq: Every day | ORAL | 0 refills | Status: DC

## 2019-12-31 MED ORDER — RIFAXIMIN 550 MG PO TABS: 550.0000 mg | ORAL_TABLET | Freq: Two times a day (BID) | ORAL | 2 refills | Status: AC

## 2019-12-31 MED ORDER — PANTOPRAZOLE SODIUM 40 MG PO TBEC: 40.0000 mg | DELAYED_RELEASE_TABLET | Freq: Every day | ORAL | 2 refills | Status: AC

## 2019-12-31 MED ORDER — LACTULOSE 10 GM/15ML PO SOLN
30.0000 g | Freq: Every day | ORAL | Status: DC
  Administered 2019-12-31: 30 g via ORAL
  Filled 2019-12-31: qty 60

## 2019-12-31 MED ORDER — ADULT MULTIVITAMIN W/MINERALS CH: 1.0000 | ORAL_TABLET | Freq: Every day | ORAL | Status: AC

## 2019-12-31 MED ORDER — SPIRONOLACTONE 100 MG PO TABS: 100.0000 mg | ORAL_TABLET | Freq: Every day | ORAL | 1 refills | Status: DC

## 2019-12-31 NOTE — Evaluation (Signed)
Physical Therapy Evaluation Patient Details Name: Katherine Goodwin MRN: [id number] DOB: 1961-01-29 Today's Date: 12/31/2019   History of Present Illness  59 year old female with history of hypertension, asthma, bipolar disorder, heavy alcohol use.  Recently worsening memory, abdominal bloating and jaundiced.  Clinical Impression  Pt slow to motivated to activity, but with cuing and extra time she was able to rise to sitting w/o direct assist (heave use of bed rails) and manage a slow but safe bout of ~50 ft of ambulation/gait training.  She had flat affect t/o the session and needed some extra encouragement and cuing but ultimately showed ability to manage light in-home mobility with relative safety.      Follow Up Recommendations Home health PT    Equipment Recommendations  Rolling walker with 5" wheels;3in1 (PT)    Recommendations for Other Services       Precautions / Restrictions Precautions Precautions: Fall Restrictions Weight Bearing Restrictions: No      Mobility  Bed Mobility Overal bed mobility: Needs Assistance Bed Mobility: Supine to Sit     Supine to sit: Min guard     General bed mobility comments: heavy use of rails, but able to rise to sitting w/o phyiscal assist  Transfers Overall transfer level: Needs assistance Equipment used: Rolling walker (2 wheeled) Transfers: Sit to/from Stand Sit to Stand: Min guard         General transfer comment: slow to rise, repeated cues for set up and sequencing  Ambulation/Gait Ambulation/Gait assistance: Min guard Gait Distance (Feet): 50 Feet Assistive device: Rolling walker (2 wheeled)       General Gait Details: Pt was able to ambulate into the hallway with slow but safe and relatively consistent cadence.  O2 (on room air) remains in mid-90s, HR in the 80s with the effort.  Stairs            Wheelchair Mobility    Modified Rankin (Stroke Patients Only)       Balance Overall balance assessment:  Needs assistance Sitting-balance support: No upper extremity supported;Feet supported Sitting balance-Leahy Scale: Fair     Standing balance support: Bilateral upper extremity supported;During functional activity Standing balance-Leahy Scale: Fair                               Pertinent Vitals/Pain Pain Assessment: No/denies pain    Home Living Family/patient expects to be discharged to:: Private residence Living Arrangements: Spouse/significant other Available Help at Discharge: Family;Available PRN/intermittently   Home Access: Stairs to enter Entrance Stairs-Rails:  (yes) Entrance Stairs-Number of Steps: 3   Home Equipment: Walker - 4 wheels (reports FWW is in poor repair)      Prior Function Level of Independence: Independent         Comments: Pt has been weaker and less active recently     Hand Dominance        Extremity/Trunk Assessment   Upper Extremity Assessment Upper Extremity Assessment: Generalized weakness    Lower Extremity Assessment Lower Extremity Assessment: Generalized weakness       Communication   Communication: No difficulties  Cognition Arousal/Alertness: Lethargic Behavior During Therapy: Flat affect Overall Cognitive Status: Within Functional Limits for tasks assessed                                        General Comments  Exercises     Assessment/Plan    PT Assessment Patient needs continued PT services  PT Problem List Decreased strength;Decreased mobility       PT Treatment Interventions Gait training;Therapeutic activities;Therapeutic exercise    PT Goals (Current goals can be found in the Care Plan section)  Acute Rehab PT Goals Patient Stated Goal: To return home  PT Goal Formulation: With patient Time For Goal Achievement: 01/14/20 Potential to Achieve Goals: Good    Frequency Min 2X/week   Barriers to discharge        Co-evaluation               AM-PAC PT "6  Clicks" Mobility  Outcome Measure Help needed turning from your back to your side while in a flat bed without using bedrails?: None Help needed moving from lying on your back to sitting on the side of a flat bed without using bedrails?: A Little Help needed moving to and from a bed to a chair (including a wheelchair)?: A Little Help needed standing up from a chair using your arms (e.g., wheelchair or bedside chair)?: A Little Help needed to walk in hospital room?: A Little Help needed climbing 3-5 steps with a railing? : A Little 6 Click Score: 19    End of Session Equipment Utilized During Treatment: Gait belt Activity Tolerance: Patient limited by lethargy Patient left: in bed;with bed alarm set;with family/visitor present Nurse Communication: Mobility status;Other (comment) PT Visit Diagnosis: Unsteadiness on feet (R26.81);Muscle weakness (generalized) (M62.81);Difficulty in walking, not elsewhere classified (R26.2)    Time: 3710-6269 PT Time Calculation (min) (ACUTE ONLY): 31 min   Charges:   PT Evaluation $PT Eval Low Complexity: 1 Low PT Treatments $Gait Training: 8-22 mins        Kreg Shropshire, DPT 12/31/2019, 10:20 AM

## 2019-12-31 NOTE — Progress Notes (Signed)
[facility] [employer]  A and O x 4 VSS. Pt tolerating diet well. No complaints of pain or nausea. IV removed intact, prescriptions given. Pt voices understanding of discharge instructions with no further questions. Pt discharged via wheelchair with axillary.   Allergies as of 12/31/2019      Reactions   Prozac [fluoxetine Hcl] Other (See Comments)   Homicidal and suicidal thoughts   Seroquel [quetiapine Fumarate] Other (See Comments)   Homicidal and suicidal thoughts   Sulfa Antibiotics Nausea And Vomiting      Medication List    STOP taking these medications   ibuprofen 800 MG tablet Commonly known as: ADVIL   lamoTRIgine 100 MG tablet Commonly known as: LAMICTAL     TAKE these medications   diphenhydrAMINE 25 MG tablet Commonly known as: BENADRYL Take 25 mg by mouth every 6 (six) hours as needed for itching or allergies.   lactulose 10 GM/15ML solution Commonly known as: CHRONULAC Take 45 mLs (30 g total) by mouth daily. What changed:   how much to take  when to take this  reasons to take this   lidocaine 5 % Commonly known as: LIDODERM Place 2 patches onto the skin daily. Remove & Discard patch within 12 hours or as directed by MD   multivitamin with minerals Tabs tablet Take 1 tablet by mouth daily.   pantoprazole 40 MG tablet Commonly known as: PROTONIX Take 1 tablet (40 mg total) by mouth daily.   rifaximin 550 MG Tabs tablet Commonly known as: XIFAXAN Take 1 tablet (550 mg total) by mouth 2 (two) times daily.   spironolactone 100 MG tablet Commonly known as: ALDACTONE Take 1 tablet (100 mg total) by mouth daily. What changed:   medication strength  how much to take            Durable Medical Equipment  (From admission, onward)         Start     Ordered   12/31/19 1028  For home use only DME Bedside commode  Once       Question:  Patient needs a bedside commode to treat with the following condition  Answer:  Weakness   12/31/19 1027   12/31/19  0931  For home use only DME Walker rolling  Once       Question Answer Comment  Walker: With [place]   Patient needs a walker to treat with the following condition General weakness      12/31/19 0930          Vitals:   12/31/19 0423 12/31/19 1159  BP: 100/62 91/61  Pulse: 78 71  Resp: 16 18  Temp: 97.6 F (36.4 C) 97.8 F (36.6 C)  SpO2: 98% 99%    Elly Modena

## 2019-12-31 NOTE — Discharge Instructions (Signed)
DO NOT drink ALCOHOL

## 2019-12-31 NOTE — Discharge Summary (Addendum)
[facility] [place] NAME: Katherine Goodwin    MR#:  [id number]  DATE OF BIRTH:  10/08/1960  DATE OF ADMISSION:  12/25/2019 ADMITTING PHYSICIAN: Lenore Cordia, MD  DATE OF DISCHARGE: 12/31/2019  PRIMARY CARE PHYSICIAN: Glean Hess, MD   ADMISSION DIAGNOSIS:  Shortness of breath [R06.02] Hypokalemia [E87.6] Cirrhosis (HCC) [K74.60] Hyponatremia [E87.1] Hyperammonemia (HCC) [E72.20] Peripheral edema [R60.9] Weakness [R53.1] Jaundice [R17] RUQ pain [R10.11] Bloody diarrhea [R19.7] Elevated liver function tests [R79.89] Abdominal pain [R10.9] Decompensated liver disease ([place]) [K74.69] Leukocytosis, unspecified type [D72.829]  DISCHARGE DIAGNOSIS:  End stage cirrhosis of liver--alcohol induced  SECONDARY DIAGNOSIS:   Past Medical History:  Diagnosis Date  . Asthma   . Depression   . GERD (gastroesophageal reflux disease)   . Hypertension   . Kidney stones   . Osteoarthrosis, unspecified whether generalized or localized, involving lower leg    leg  . Other bipolar disorders   . Seizure ([place])    none since age 38  . Tobacco abuse   . Trigger finger   . Wears dentures    partial upper    HOSPITAL COURSE:   59 year old female with a known history of ongoing alcohol abuse, severe hepatic steatosis, hypertension, asthma, bipolar disorder admitted for decompensated liver disease with bright red blood per rectum  Decompensated liver disease with hyperbilirubinemia/transaminitis secondary to alcohol abuse -Right upper quadrant ultrasound showing cholelithiasis and gallbladder sludge without evidence of acute cholecystitis, CBD 4.7 mm, fatty liver -MRI not showing any other acute pathology -GI consult appreciated--Dr Vicente Males -Total bilirubin 23.9->19.9--16.9--15.8 -Considering recent history of GI bleed GI recommended to stop steroids-done on 7/10. Was initially started due to high MELD score -patient understands she has  end-stage liver disease and highly recommend stop drinking alcohol--pt agrees this time -cont lactulose daily, rifaximin and spironolactone  Hypotension: Present on admissionrequiring pressors. Improved now -Echo within normal limits -US guided paracentesis with removal of 1.7 L of fluid -Empiric Rocephin + Flagyl--d/c it--no s/o infection in peritoneal fluid  Acute blood loss anemia  bright red blood per rectum IV Protonix 40 mg twice daily for now--change to po qd Hemoglobin 9.2 (10.2 on admission) Transfuse for hemoglobin less than 8 Status post EGD on 7/9 showing no active bleeder.  Acute kidney injury Likely ATN from hypotension Improving with hydration, creatinine 1.1  Hepatic encephalopathy--mentation improved Monitor, ammonia 87->48 Continue lactuloseand Xifaxan (started 7/10 per GI) Avoid narcotics  Hyponatremia/hypokalemia Sodium  improving oral intake Started Aldactone 100 mg daily on 7/10. Can increase this to 200 mg in a week as appropriate--defer to PCP Avoid SALT tablets  Ascites Due to underlying liver disease Started Aldactone per GI  -Overall prognosis poor.  -Palliative care consultation for goals of care--pt will follow with palliative as out pt  Covid is negative  Body mass index is 27.26 kg/m.   Status is: Inpatient  Remains inpatient appropriate because:Inpatient level of care appropriate due to severity of illness   Dispo: The patient is from: Home Anticipated d/c is to: Home Anticipated d/c date HA:LPFXT Patient currently is medically stable to d/c.  TOC for discharge planning. Okay from G.I. standpoint for discharge.  Trying to reach husband  And dter on the phone--unsuccessful. TOC was able to talk with husband today.  Pt is at a [place] for readmission given her liver disease.  CONSULTS OBTAINED:  Treatment Team:  Lucilla Lame, MD Jonathon Bellows, MD  DRUG ALLERGIES:    Allergies  Allergen  Reactions  . Prozac [Fluoxetine Hcl] Other (See Comments)    Homicidal and suicidal thoughts  . Seroquel [Quetiapine Fumarate] Other (See Comments)    Homicidal and suicidal thoughts  . Sulfa Antibiotics Nausea And Vomiting    DISCHARGE MEDICATIONS:   Allergies as of 12/31/2019      Reactions   Prozac [fluoxetine Hcl] Other (See Comments)   Homicidal and suicidal thoughts   Seroquel [quetiapine Fumarate] Other (See Comments)   Homicidal and suicidal thoughts   Sulfa Antibiotics Nausea And Vomiting      Medication List    STOP taking these medications   ibuprofen 800 MG tablet Commonly known as: ADVIL   lamoTRIgine 100 MG tablet Commonly known as: LAMICTAL     TAKE these medications   diphenhydrAMINE 25 MG tablet Commonly known as: BENADRYL Take 25 mg by mouth every 6 (six) hours as needed for itching or allergies.   lactulose 10 GM/15ML solution Commonly known as: CHRONULAC Take 45 mLs (30 g total) by mouth daily. What changed:   how much to take  when to take this  reasons to take this   lidocaine 5 % Commonly known as: LIDODERM Place 2 patches onto the skin daily. Remove & Discard patch within 12 hours or as directed by MD   multivitamin with minerals Tabs tablet Take 1 tablet by mouth daily.   pantoprazole 40 MG tablet Commonly known as: PROTONIX Take 1 tablet (40 mg total) by mouth daily.   rifaximin 550 MG Tabs tablet Commonly known as: XIFAXAN Take 1 tablet (550 mg total) by mouth 2 (two) times daily.   spironolactone 100 MG tablet Commonly known as: ALDACTONE Take 1 tablet (100 mg total) by mouth daily. What changed:   medication strength  how much to take            Durable Medical Equipment  (From admission, onward)         Start     Ordered   12/31/19 1028  For home use only DME Bedside commode  Once       Question:  Patient needs a bedside commode to treat with the following condition  Answer:   Weakness   12/31/19 1027   12/31/19 0931  For home use only DME Walker rolling  Once       Question Answer Comment  Walker: With 5 Inch Wheels   Patient needs a walker to treat with the following condition General weakness      12/31/19 0930          If you experience worsening of your admission symptoms, develop shortness of breath, life threatening emergency, suicidal or homicidal thoughts you must seek medical attention immediately by calling 911 or calling your MD immediately  if symptoms less severe.  You Must read complete instructions/literature along with all the possible adverse reactions/side effects for all the Medicines you take and that have been prescribed to you. Take any new Medicines after you have completely understood and accept all the possible adverse reactions/side effects.   Please note  You were cared for by a hospitalist during your hospital stay. If you have any questions about your discharge medications or the care you received while you were in the hospital after you are discharged, you can call the unit and asked to speak with the hospitalist on call if the hospitalist that took care of you is not available. Once you are discharged, your primary care physician will handle any further medical issues.  Please note that NO REFILLS for any discharge medications will be authorized once you are discharged, as it is imperative that you return to your primary care physician (or establish a relationship with a primary care physician if you do not have one) for your aftercare needs so that they can reassess your need for medications and monitor your lab values. Today   SUBJECTIVE   Some diarrhea with lactulose  VITAL SIGNS:  Blood pressure 100/62, pulse 78, temperature 97.6 F (36.4 C), temperature source Oral, resp. rate 16, height 5\' 5"  (1.651 m), weight 90.3 kg, SpO2 98 %.  I/O:    Intake/Output Summary (Last 24 hours) at 12/31/2019 1141 Last data filed at  12/30/2019 1425 Gross per 24 hour  Intake 300 ml  Output 100 ml  Net 200 ml    PHYSICAL EXAMINATION:  GENERAL:  59 y.o.-year-old patient lying in the bed with no acute distress.  EYES: Pupils equal, round, reactive to light and accommodation.++ scleral icterus.  HEENT: Head atraumatic, normocephalic. Oropharynx and nasopharynx clear.  NECK:  Supple, no jugular venous distention. No thyroid enlargement, no tenderness.  LUNGS: Normal breath sounds bilaterally, no wheezing, rales,rhonchi or crepitation. No use of accessory muscles of respiration.  CARDIOVASCULAR: S1, S2 normal. No murmurs, rubs, or gallops.  ABDOMEN: Soft, non-tender, non-distended. Bowel sounds present. No organomegaly or mass.  EXTREMITIES: No pedal edema, cyanosis, or clubbing.  NEUROLOGIC: Cranial nerves II through XII are intact. Muscle strength 5/5 in all extremities. Sensation intact. Gait not checked.  PSYCHIATRIC: The patient is alert and oriented x 3.  SKIN: Jaundiced skin  DATA REVIEW:   CBC  Recent Labs  Lab 12/30/19 0536  WBC 17.9*  HGB 9.2*  HCT 26.1*  PLT 122*    Chemistries  Recent Labs  Lab 12/29/19 1137 12/30/19 0536 12/31/19 0425  NA  --    < > 132*  K  --    < > 3.3*  CL  --    < > 106  CO2  --    < > 17*  GLUCOSE  --    < > 87  BUN  --    < > 27*  CREATININE  --    < > 0.97  CALCIUM  --    < > 8.0*  MG 2.0  --   --   AST  --    < > 101*  ALT  --    < > 35  ALKPHOS  --    < > 302*  BILITOT  --    < > 15.8*   < > = values in this interval not displayed.    Microbiology Results   Recent Results (from the past 240 hour(s))  Gastrointestinal Panel by PCR , Stool     Status: None   Collection Time: 12/25/19  9:43 PM   Specimen: STOOL  Result Value Ref Range Status   Campylobacter species NOT DETECTED NOT DETECTED Final   Plesimonas shigelloides NOT DETECTED NOT DETECTED Final   Salmonella species NOT DETECTED NOT DETECTED Final   Yersinia enterocolitica NOT DETECTED NOT  DETECTED Final   Vibrio species NOT DETECTED NOT DETECTED Final   Vibrio cholerae NOT DETECTED NOT DETECTED Final   Enteroaggregative E coli (EAEC) NOT DETECTED NOT DETECTED Final   Enteropathogenic E coli (EPEC) NOT DETECTED NOT DETECTED Final   Enterotoxigenic E coli (ETEC) NOT DETECTED NOT DETECTED Final   Shiga like toxin producing E coli (STEC) NOT DETECTED NOT DETECTED Final  Shigella/Enteroinvasive E coli (EIEC) NOT DETECTED NOT DETECTED Final   Cryptosporidium NOT DETECTED NOT DETECTED Final   Cyclospora cayetanensis NOT DETECTED NOT DETECTED Final   Entamoeba histolytica NOT DETECTED NOT DETECTED Final   Giardia lamblia NOT DETECTED NOT DETECTED Final   Adenovirus F40/41 NOT DETECTED NOT DETECTED Final   Astrovirus NOT DETECTED NOT DETECTED Final   Norovirus GI/GII NOT DETECTED NOT DETECTED Final   Rotavirus A NOT DETECTED NOT DETECTED Final   Sapovirus (I, II, IV, and V) NOT DETECTED NOT DETECTED Final    Comment: Performed at [facility], [address], [place], [place] [address]  C Difficile Quick Screen w PCR reflex     Status: Abnormal   Collection Time: 12/25/19  9:43 PM   Specimen: STOOL  Result Value Ref Range Status   C Diff antigen POSITIVE (A) NEGATIVE Final   C Diff toxin NEGATIVE NEGATIVE Final   C Diff interpretation Results are indeterminate. See PCR results.  Final    Comment: Performed at [facility], [address] [place], [place] [address]  C. Diff by PCR, Reflexed     Status: None   Collection Time: 12/25/19  9:43 PM  Result Value Ref Range Status   Toxigenic C. Difficile by PCR NEGATIVE NEGATIVE Final    Comment: Patient is colonized with non toxigenic C. difficile. May not need treatment unless significant symptoms are present. Performed at [facility], [address] [place], [place] [address]   CULTURE, BLOOD (ROUTINE X 2) w Reflex to ID Panel     Status: None   Collection Time: 12/26/19  5:51 AM   Specimen:  BLOOD LEFT FOREARM  Result Value Ref Range Status   Specimen Description BLOOD LEFT FOREARM  Final   Special Requests   Final    BOTTLES DRAWN AEROBIC AND ANAEROBIC Blood Culture adequate volume   Culture   Final    NO GROWTH 5 DAYS Performed at [facility], [address] [place], [place] [address]    Report Status 12/31/2019 FINAL  Final  CULTURE, BLOOD (ROUTINE X 2) w Reflex to ID Panel     Status: None   Collection Time: 12/26/19  5:51 AM   Specimen: BLOOD RIGHT HAND  Result Value Ref Range Status   Specimen Description BLOOD RIGHT HAND  Final   Special Requests   Final    BOTTLES DRAWN AEROBIC AND ANAEROBIC Blood Culture adequate volume   Culture   Final    NO GROWTH 5 DAYS Performed at [facility], [address] [place], [place] [address]    Report Status 12/31/2019 FINAL  Final  SARS Coronavirus 2 by RT PCR (hospital order, performed in [facility] hospital lab) Nasopharyngeal Nasopharyngeal Swab     Status: None   Collection Time: 12/26/19 11:58 AM   Specimen: Nasopharyngeal Swab  Result Value Ref Range Status   SARS Coronavirus 2 NEGATIVE NEGATIVE Final    Comment: (NOTE) SARS-CoV-2 target nucleic acids are NOT DETECTED.  The SARS-CoV-2 RNA is generally detectable in upper and lower respiratory specimens during the acute phase of infection. The lowest concentration of SARS-CoV-2 viral copies this assay can detect is 250 copies / mL. A negative result does not preclude SARS-CoV-2 infection and should not be used as the sole basis for treatment or other patient management decisions.  A negative result may occur with improper specimen collection / handling, submission of specimen other than nasopharyngeal swab, presence of viral mutation(s) within  the areas targeted by this assay, and inadequate number of viral copies (<250 copies / mL). A negative result must be combined with clinical observations, patient history, and epidemiological  information.  Fact Sheet for Patients:   StrictlyIdeas.no  Fact Sheet for Healthcare Providers: BankingDealers.co.za  This test is not yet approved or  cleared by the [place] FDA and has been authorized for detection and/or diagnosis of SARS-CoV-2 by FDA under an Emergency Use Authorization (EUA).  This EUA will remain in effect (meaning this test can be used) for the duration of the COVID-19 declaration under Section 564(b)(1) of the Act, 21 U.S.C. section 360bbb-3(b)(1), unless the authorization is terminated or revoked sooner.  Performed at [facility], [address] [place], [place] [address]   Body fluid culture     Status: None   Collection Time: 12/27/19 12:30 PM   Specimen: PATH Cytology Peritoneal fluid  Result Value Ref Range Status   Specimen Description   Final    PERITONEAL Performed at [facility], [address], [place], [place] [address]    Special Requests   Final    PERITONEAL Performed at [facility], [address] [place], [place] [address]    Gram Stain   Final    RARE WBC PRESENT, PREDOMINANTLY MONONUCLEAR NO ORGANISMS SEEN    Culture   Final    NO GROWTH 3 DAYS Performed at [facility], [place] [address], [place], [place] [address]    Report Status 12/30/2019 FINAL  Final  Anaerobic culture     Status: None (Preliminary result)   Collection Time: 12/27/19 12:30 PM   Specimen: Peritoneal Washings  Result Value Ref Range Status   Specimen Description PERITONEAL  Final   Special Requests   Final    NONE Performed at [facility], [place] [address], [place], [place] [address]    Culture   Final    NO ANAEROBES ISOLATED; CULTURE IN PROGRESS FOR 5 DAYS   Report Status PENDING  Incomplete    RADIOLOGY:  No results found.   CODE STATUS:     Code Status Orders  (From admission, onward)         Start     Ordered   12/26/19 0053  Full code   Continuous        12/26/19 0054        Code Status History    Date Active Date Inactive Code Status Order ID Comments User Context   12/13/2019 0057 12/15/2019 1806 Full Code [id number]  Athena Masse, MD ED   Advance Care Planning Activity       TOTAL TIME TAKING CARE OF THIS PATIENT: 35 minutes.    Fritzi Mandes M.D  [facility]    CC: Primary care physician; Glean Hess, MD

## 2019-12-31 NOTE — TOC Initial Note (Signed)
Transition of Care [facility]) - Initial/Assessment Note    Patient Details  Name: Katherine Goodwin MRN: [id number] Date of Birth: May 15, 1961  Transition of Care [facility]) CM/SW Contact:    Beverly Sessions, RN Phone Number: 12/31/2019, 12:34 PM  Clinical Narrative:                  Patient admitted from home with decompensated liver disease.  Patient lives at home with spouse.   He is at bedside.   PCP [facility].  Pharmacy [facility]  Husband provides transport.  Also provided them with information on Medicaid transportation.   Denies issues obtaining medications.    PT has assessed patient and recommends home health PT.  Patient in agreement.  States she does not have a preference of home health agency.  RNCM checked with 7 home health agencies.  Only agency able to accept [place].  Start of care to be 7/19.  MD aware and to notate in home health order.   RW and BSC delivered by [facility].   Outpatient palliative referral was made to [facility] with [employer].  She was notified of discharge.   All of spouses questions answered via [place] interpreter.    Expected Discharge Plan: [place] Barriers to Discharge: No Barriers Identified   Patient Goals and CMS Choice   CMS Medicare.gov Compare Post Acute Care list provided to:: Patient Choice offered to / list presented to : Patient  Expected Discharge Plan and Services Expected Discharge Plan: [place]   Discharge Planning Services: CM Consult   Living arrangements for the past 2 months: Single Family Home Expected Discharge Date: 12/31/19               DME Arranged: Berta Minor rolling DME Agency: [facility] Date DME Agency Contacted: 12/31/19   Representative spoke with at DME Agency: [place]: PT [place]: [place] ([place]) Date [place]: 12/31/19   Representative spoke with at [place]: Corene Cornea  Prior Living  Arrangements/Services Living arrangements for the past 2 months: [place] Lives with:: Spouse Patient language and need for interpreter reviewed:: Yes Do you feel safe going back to the place where you live?: Yes      Need for Family Participation in Patient Care: Yes (Comment) Care giver support system in place?: Yes (comment) Current home services: DME    Activities of Daily Living Home Assistive Devices/Equipment: Cane (specify quad or straight), Walker (specify type) ADL Screening (condition at time of admission) Patient's cognitive ability adequate to safely complete daily activities?: Yes Is the patient deaf or have difficulty hearing?: No Does the patient have difficulty seeing, even when wearing glasses/contacts?: No Does the patient have difficulty concentrating, remembering, or making decisions?: No Patient able to express need for assistance with ADLs?: Yes Does the patient have difficulty dressing or bathing?: No Independently performs ADLs?: Yes (appropriate for developmental age) Does the patient have difficulty walking or climbing stairs?: Yes Weakness of Legs: Both Weakness of Arms/Hands: Both  Permission Sought/Granted                  Emotional Assessment       Orientation: : Oriented to Self, Oriented to Place, Oriented to  Time, Oriented to Situation Alcohol / Substance Use: Alcohol Use Psych Involvement: No (comment)  Admission diagnosis:  Shortness of breath [R06.02] Hypokalemia [E87.6] Cirrhosis ([place]) [K74.60] Hyponatremia [E87.1] Hyperammonemia (HCC) [E72.20] Peripheral edema [R60.9] Weakness [  R53.1] Jaundice [R17] RUQ pain [R10.11] Bloody diarrhea [R19.7] Elevated liver function tests [R79.89] Abdominal pain [R10.9] Decompensated liver disease (HCC) [K74.69] Leukocytosis, unspecified type [D72.829] Patient Active Problem List   Diagnosis Date Noted  . Palliative care by specialist   . Goals of care, counseling/discussion   .  Advanced directives, counseling/discussion   . Full code status   . Weakness   . Cirrhosis ([place])   . Hyperammonemia ([place])   . Hyperbilirubinemia 12/26/2019  . Elevated LFTs 12/26/2019  . Hyponatremia 12/26/2019  . Decompensated liver disease ([place]) 12/26/2019  . Portal hypertension ([place]) 12/26/2019  . Acute hepatic encephalopathy 12/13/2019  . Alcoholic ketoacidosis 98/92/1194  . Alcoholic steatohepatitis 17/40/8144  . Hypokalemia 12/13/2019  . Hypomagnesemia 12/13/2019  . Alcohol use disorder, moderate, dependence ([place]) 12/13/2019  . Macrocytic anemia 12/13/2019  . Liver failure, acute 12/13/2019  . Mild asthma 09/18/2019  . Chronic venous insufficiency 04/04/2019  . Leg pain 04/04/2019  . Essential hypertension 04/04/2019  . PAD (peripheral artery disease) ([place]) 03/25/2019  . Localized edema 03/25/2019  . Tubular adenoma of colon 12/18/2018  . Family history of colonic polyps   . Duodenitis   . Gastritis without bleeding   . Blood in stool 12/03/2018  . Hepatic steatosis 12/03/2018  . Tobacco dependence 06/12/2015  . Elevated blood pressure 06/12/2015  . 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 Dizziness 10/17/2013  . Abnormal EKG 10/17/2013  . Back pain 10/17/2013  . Bipolar disorder ([place]) 10/17/2013  . Prolonged QT interval 10/17/2013  . Near syncope 10/17/2013   PCP:  Glean Hess, MD Pharmacy:   [facility] [address]), [place] [address] [place]) [place] [address] Phone: [phone] Fax: [phone]     Social Determinants of Health (SDOH) Interventions    Readmission Risk Interventions No flowsheet data found.

## 2019-12-31 NOTE — Progress Notes (Addendum)
[name], female   DOB: 1960/09/24, 59 y.o.   MRN: [id number] Spoke wt length with using spanish interpreter [name] [phone] dter and AD [name] was present in the room during the conversation with the husband.  Reconnected with [name]#

## 2020-01-02 ENCOUNTER — Telehealth: Payer: Self-pay | Admitting: Internal Medicine

## 2020-01-02 LAB — ANAEROBIC CULTURE

## 2020-01-02 LAB — TOTAL BILIRUBIN, BODY FLUID: Total bilirubin, fluid: 2.3 mg/dL

## 2020-01-02 NOTE — Telephone Encounter (Signed)
Called to get verbal order for palliative care for patient.  Questions please call [phone], Option 2.

## 2020-01-02 NOTE — Telephone Encounter (Signed)
Called [facility] to speak to [facility] no one answered the phone left VM giving verbal orders.   KP

## 2020-01-03 ENCOUNTER — Inpatient Hospital Stay: Payer: Medicaid Other | Admitting: Internal Medicine

## 2020-01-03 ENCOUNTER — Telehealth: Payer: Self-pay | Admitting: Gastroenterology

## 2020-01-03 ENCOUNTER — Telehealth: Payer: Self-pay | Admitting: General Practice

## 2020-01-03 LAB — LIPASE, FLUID: Lipase-Fluid: 11 U/L

## 2020-01-03 NOTE — Telephone Encounter (Signed)
Called patient 3x to let her know that I have a Hospital follow up  Appointment scheduled with Dr. Vicente Males 01/09/2020. Patient's VM is full and can't leave messages. I also called spouse and LVM. Unable to contact patient. I will mail a letter out to patient.

## 2020-01-03 NOTE — Telephone Encounter (Signed)
Spoke with Daughter Minda Ditto I informed her that our office had made several attempts to contact patient and Patient's Husband Ivin Poot and was unsuccessful. Had first appointment scheduled on July 22,2021 ,but cancelled due to not being able to contact patient. Rescheduled patient to January 13, 2020. Informed Daughter that she wasn't on her Mom's DPR and that one hasn't been filled out since 2015. [name] stated that she would get her mom to fill out another one and that she may have to take her back to the hospital.

## 2020-01-08 ENCOUNTER — Telehealth: Payer: Self-pay | Admitting: Internal Medicine

## 2020-01-08 NOTE — Telephone Encounter (Signed)
Home Health Verbal Orders - Caller/Agency: [facility]  Callback Number: [phone] can be left  Requesting PT Frequency: 1x a week for 1 week  2x's a week for 3 weeks  They are seeing her after hospitalization due to sorosis of the liver from alcohol abuse

## 2020-01-08 NOTE — Telephone Encounter (Signed)
Called and gave verbal orders to [place]. Merry Proud verbalized understanding.  KP

## 2020-01-09 ENCOUNTER — Ambulatory Visit: Payer: Medicaid Other | Admitting: Gastroenterology

## 2020-01-10 ENCOUNTER — Telehealth: Payer: Self-pay | Admitting: Nurse Practitioner

## 2020-01-10 NOTE — Telephone Encounter (Signed)
Rec'd call back from daughter and have scheduled an In-home Palliative Consult for 01/14/20 @ 12:15 PM

## 2020-01-10 NOTE — Telephone Encounter (Signed)
Called patient to schedule a Palliative Consult with her, no answer and unable to leave message due to mailbox was full.  Called patient's husband, Trudee Grip and spoke with him and explained why I was calling and he said that patient sleeps mainly during the day and he requested that I contact patient's daughter, [name].  Called [name], no answer -  Left message with reason for call along with my name and contact number requesting a return call.

## 2020-01-13 ENCOUNTER — Other Ambulatory Visit: Payer: Self-pay

## 2020-01-13 ENCOUNTER — Ambulatory Visit (INDEPENDENT_AMBULATORY_CARE_PROVIDER_SITE_OTHER): Payer: Medicaid Other | Admitting: Gastroenterology

## 2020-01-13 VITALS — BP 90/62 | HR 101 | Temp 97.8°F | Ht 65.0 in

## 2020-01-13 DIAGNOSIS — K7290 Hepatic failure, unspecified without coma: Secondary | ICD-10-CM

## 2020-01-13 DIAGNOSIS — K7682 Hepatic encephalopathy: Secondary | ICD-10-CM

## 2020-01-13 DIAGNOSIS — K7031 Alcoholic cirrhosis of liver with ascites: Secondary | ICD-10-CM

## 2020-01-13 DIAGNOSIS — M6284 Sarcopenia: Secondary | ICD-10-CM | POA: Diagnosis not present

## 2020-01-13 MED ORDER — FOLIC ACID 1 MG PO TABS: 1.0000 mg | ORAL_TABLET | Freq: Every day | ORAL | 2 refills | Status: AC

## 2020-01-13 NOTE — Addendum Note (Signed)
Addended by: Jonathon Bellows on: 01/13/2020 03:54 PM   Modules accepted: Orders

## 2020-01-13 NOTE — Progress Notes (Addendum)
Jonathon Bellows MD, MRCP(U.K) [address]  [place]  [place], [place] [address]  Main: [phone]  Fax: [phone]   Primary Care Physician: Glean Hess, MD  Primary Gastroenterologist:  Dr. Harland German follow-up  HPI: Katherine Goodwin is a 59 y.o. female   She is a patient of Dr. Allen Norris and was recently admitted to the hospital for decompensated liver disease on 12/25/2019.  She had been consuming hard liquor every day for about 15 to 20 years.  Was recently admitted in hospital and had stopped drinking.  End of June admitted for alcoholic hepatitis.  Discharged on 12/12/2009.  At that time her bilirubin is 23.9.  She was commenced on prednisone.  She underwent an upper endoscopy which demonstrated hypertensive gastropathy. Subsequently she was seen by myself calculated meld was 29.  She has recent ascites which is a transudate no evidence of SBP.  She did have hepatic encephalopathy.  With recent GI bleed I recommended stopping the steroids.  Treated with Xifaxan and lactulose for hepatic encephalopathy.  She was subsequently discharged on 12/31/2019.  Bilirubin had began to trending down to worse discharge.  Commenced on Aldactone 100 mg a day on 12/28/2019 with plan to increase Aldactone as an outpatient.  Palliative care was consulted.  And she is an appointment on 01/14/2020.  MRI MRCP of the abdomen on 12/26/2019 demonstrated diffuse hepatic steatosis.  Cholelithiasis but no biliary dilation or filling defect in the common bile duct.  Evaluation has been negative for acute hepatitis B and C.  Iron percentage saturation was elevated in the past with a ferritin of 388.  Low folate, normal B12  No labs since 12/31/2019   Today was accompanied by her family.  She has completely stopped drinking alcohol.  Trying to stop smoking.  No NSAID use.  Eating better.  Still feels weak.  Alert and oriented.  Current Outpatient Medications  Medication Sig Dispense Refill  .  diphenhydrAMINE (BENADRYL) 25 MG tablet Take 25 mg by mouth every 6 (six) hours as needed for itching or allergies.     Marland Kitchen lactulose (CHRONULAC) 10 GM/15ML solution Take 45 mLs (30 g total) by mouth daily. 236 mL 3  . lidocaine (LIDODERM) 5 % Place 2 patches onto the skin daily. Remove & Discard patch within 12 hours or as directed by MD 20 patch 0  . Multiple Vitamin (MULTIVITAMIN WITH MINERALS) TABS tablet Take 1 tablet by mouth daily.    . pantoprazole (PROTONIX) 40 MG tablet Take 1 tablet (40 mg total) by mouth daily. 30 tablet 2  . rifaximin (XIFAXAN) 550 MG TABS tablet Take 1 tablet (550 mg total) by mouth 2 (two) times daily. 42 tablet 2  . spironolactone (ALDACTONE) 100 MG tablet Take 1 tablet (100 mg total) by mouth daily. 30 tablet 1   No current facility-administered medications for this visit.    Allergies as of 01/13/2020 - Review Complete 01/13/2020  Allergen Reaction Noted  . Prozac [fluoxetine hcl] Other (See Comments) 03/04/2017  . Seroquel [quetiapine fumarate] Other (See Comments) 03/04/2017  . Sulfa antibiotics Nausea And Vomiting 12/10/2018    ROS:  General: Negative for anorexia, weight loss, fever, chills, fatigue, weakness. ENT: Negative for hoarseness, difficulty swallowing , nasal congestion. CV: Negative for chest pain, angina, palpitations, dyspnea on exertion, peripheral edema.  Respiratory: Negative for dyspnea at rest, dyspnea on exertion, cough, sputum, wheezing.  GI: See history of present illness. GU:  Negative for dysuria, hematuria, urinary  incontinence, urinary frequency, nocturnal urination.  Endo: Negative for unusual weight change.    Physical Examination:   BP (!) 90/62   Pulse 101   Temp 97.8 F (36.6 C)   Ht 5\' 5"  (1.651 m)   BMI 33.12 kg/m   General: Well-nourished, well-developed in no acute distress.  Eyes: Icterus noted.   Mouth: Oropharyngeal mucosa moist and pink , no lesions erythema or exudate. Lungs: Clear to auscultation  bilaterally. Non-labored. Heart: Regular rate and rhythm, no murmurs rubs or gallops.  Abdomen: B distended, no tenderness, no guarding, no rigidity, ascites present tense.  Bowel sounds. Extremities: Sarcopenia noted bruising over limbs noted Neuro: Alert and oriented x 3.  Grossly intact. Skin: Warm and dry, no jaundice.   Psych: Alert and cooperative, normal mood and affect.   Imaging Studies: MR ABDOMEN MRCP WO CONTRAST  Result Date: 12/26/2019 CLINICAL DATA:  Cirrhosis, leukocytosis.  Jaundice. EXAM: MRI ABDOMEN WITHOUT CONTRAST  (INCLUDING MRCP) TECHNIQUE: Multiplanar multisequence MR imaging of the abdomen was performed. Heavily T2-weighted images of the biliary and pancreatic ducts were obtained, and three-dimensional MRCP images were rendered by post processing. COMPARISON:  Multiple exams, including abdominal ultrasound 12/25/2019 and CT abdomen from 12/12/2019 FINDINGS: Despite efforts by the technologist and patient, motion artifact is present on today's exam and could not be eliminated. This reduces exam sensitivity and specificity. Lower chest: Unremarkable Hepatobiliary: Gallbladder wall thickening is nonspecific. No biliary dilatation. Diffuse hepatic steatosis. No obvious filling defect along the common bile duct or common hepatic duct. Granular signal dependently in the gallbladder suggests small gallstones and sludge. No significant focal parenchymal lesion is identified in the liver. Mild prominence of the left hepatic lobe could be an early morphologic indicator of cirrhosis, but there is no current nodularity of the liver margin. Pancreas: Trace peripancreatic edema although not disproportionate to the surrounding mesenteric edema and ascites, although correlation with lipase level is suggested. No dorsal pancreatic duct dilatation. Spleen:  Unremarkable Adrenals/Urinary Tract:  Unremarkable Stomach/Bowel: Unremarkable Vascular/Lymphatic: Aortoiliac atherosclerotic vascular disease.  Borderline enlarged porta hepatis/peripancreatic lymph nodes up to 1.0 cm in diameter on image 22/8. Other: Notable upper abdominal ascites. Mesenteric edema and edema along both flanks. Musculoskeletal: Unremarkable IMPRESSION: 1. Despite efforts by the technologist and patient, motion artifact is present on today's exam and could not be eliminated. This reduces exam sensitivity and specificity. 2. Diffuse hepatic steatosis. Mild prominence of the left hepatic lobe could be an early morphologic indicator of cirrhosis, but there is no current nodularity of the liver margin. 3. Trace peripancreatic edema although not disproportionate to the surrounding mesenteric edema and ascites, although correlation with lipase level is suggested. There is also subcutaneous edema along the flanks. 4. Cholelithiasis. Gallbladder wall thickening is nonspecific and could be from hypoproteinemia/hypoalbuminemia or inflammation. 5. No biliary dilatation or obvious filling defect along the common bile duct. 6. Borderline enlarged porta hepatis/peripancreatic lymph nodes. Electronically Signed   By: Van Clines M.D.   On: 12/26/2019 05:35   MR 3D Recon At Scanner  Result Date: 12/26/2019 CLINICAL DATA:  Cirrhosis, leukocytosis.  Jaundice. EXAM: MRI ABDOMEN WITHOUT CONTRAST  (INCLUDING MRCP) TECHNIQUE: Multiplanar multisequence MR imaging of the abdomen was performed. Heavily T2-weighted images of the biliary and pancreatic ducts were obtained, and three-dimensional MRCP images were rendered by post processing. COMPARISON:  Multiple exams, including abdominal ultrasound 12/25/2019 and CT abdomen from 12/12/2019 FINDINGS: Despite efforts by the technologist and patient, motion artifact is present on today's exam and could not be eliminated.  This reduces exam sensitivity and specificity. Lower chest: Unremarkable Hepatobiliary: Gallbladder wall thickening is nonspecific. No biliary dilatation. Diffuse hepatic steatosis. No obvious  filling defect along the common bile duct or common hepatic duct. Granular signal dependently in the gallbladder suggests small gallstones and sludge. No significant focal parenchymal lesion is identified in the liver. Mild prominence of the left hepatic lobe could be an early morphologic indicator of cirrhosis, but there is no current nodularity of the liver margin. Pancreas: Trace peripancreatic edema although not disproportionate to the surrounding mesenteric edema and ascites, although correlation with lipase level is suggested. No dorsal pancreatic duct dilatation. Spleen:  Unremarkable Adrenals/Urinary Tract:  Unremarkable Stomach/Bowel: Unremarkable Vascular/Lymphatic: Aortoiliac atherosclerotic vascular disease. Borderline enlarged porta hepatis/peripancreatic lymph nodes up to 1.0 cm in diameter on image 22/8. Other: Notable upper abdominal ascites. Mesenteric edema and edema along both flanks. Musculoskeletal: Unremarkable IMPRESSION: 1. Despite efforts by the technologist and patient, motion artifact is present on today's exam and could not be eliminated. This reduces exam sensitivity and specificity. 2. Diffuse hepatic steatosis. Mild prominence of the left hepatic lobe could be an early morphologic indicator of cirrhosis, but there is no current nodularity of the liver margin. 3. Trace peripancreatic edema although not disproportionate to the surrounding mesenteric edema and ascites, although correlation with lipase level is suggested. There is also subcutaneous edema along the flanks. 4. Cholelithiasis. Gallbladder wall thickening is nonspecific and could be from hypoproteinemia/hypoalbuminemia or inflammation. 5. No biliary dilatation or obvious filling defect along the common bile duct. 6. Borderline enlarged porta hepatis/peripancreatic lymph nodes. Electronically Signed   By: Van Clines M.D.   On: 12/26/2019 05:35   US Paracentesis  Result Date: 12/27/2019 INDICATION: Patient with  history of alcohol abuse, severe hepatic steatosis, recent alcoholic hepatitis, decompensated liver disease, ascites. Request made for diagnostic and therapeutic paracentesis. EXAM: ULTRASOUND GUIDED DIAGNOSTIC AND THERAPEUTIC PARACENTESIS MEDICATIONS: 1% lidocaine to skin and subcutaneous tissue COMPLICATIONS: None immediate. PROCEDURE: Informed written consent was obtained from the patient after a discussion of the risks, benefits and alternatives to treatment. A timeout was performed prior to the initiation of the procedure. Initial ultrasound scanning demonstrates a small to moderate amount of ascites within the right mid to lower abdominal quadrant. The right mid to lower abdomen was prepped and draped in the usual sterile fashion. 1% lidocaine was used for local anesthesia. Following this, a 6 Fr Safe-T-Centesis catheter was introduced. An ultrasound image was saved for documentation purposes. The paracentesis was performed. The catheter was removed and a dressing was applied. The patient tolerated the procedure well without immediate post procedural complication. FINDINGS: A total of approximately 1.7 liters of golden yellow fluid was removed. Samples were sent to the laboratory as requested by the clinical team. IMPRESSION: Successful ultrasound-guided diagnostic and therapeutic paracentesis yielding 1.7 liters of peritoneal fluid. Read by: Rowe Robert, PA-C Electronically Signed   By: Jacqulynn Cadet M.D.   On: 12/27/2019 13:02   DG Chest Port 1 View  Result Date: 12/26/2019 CLINICAL DATA:  Shortness of breath. EXAM: PORTABLE CHEST 1 VIEW COMPARISON:  11/26/2018. FINDINGS: Heart size normal. Low lung volumes. Mild bilateral interstitial prominence. Mild interstitial edema and/or pneumonitis cannot be excluded. Tiny right pleural effusion cannot be excluded. No pneumothorax. Old right rib fractures again noted. IMPRESSION: Low lung volumes. Mild bilateral interstitial prominence. Mild interstitial  edema and/or pneumonitis cannot be excluded. Tiny right pleural effusion cannot be excluded. Electronically Signed   By: Marcello Moores  Register   On: 12/26/2019 05:25  ECHOCARDIOGRAM COMPLETE  Result Date: 12/26/2019    ECHOCARDIOGRAM REPORT   Patient Name:   Katherine Goodwin Date of Exam: 12/26/2019 Medical Rec #:  [id number]       Height:       65.0 in Accession #:    [id number]      Weight:       165.0 lb Date of Birth:  05-11-1961       BSA:          1.823 m Patient Age:    41 years        BP:           115/63 mmHg Patient Gender: F               HR:           74 bpm. Exam Location:  [facility] Procedure: 2D Echo, Cardiac Doppler and Color Doppler Indications:     Shock  History:         Patient has no prior history of Echocardiogram examinations.                  Risk Factors:Hypertension. Other bipolar disorder, tobacco                  abuse.  Sonographer:     Sherrie Sport RDCS (AE) Referring Phys:  [id number] Bradly Bienenstock Diagnosing Phys: Ida Rogue MD  Sonographer Comments: Technically difficult study due to poor echo windows, no parasternal window and no subcostal window. IMPRESSIONS  1. Left ventricular ejection fraction, by estimation, is 60 to 65%. The left ventricle has normal function. The left ventricle has no regional wall motion abnormalities. Left ventricular diastolic parameters were normal.  2. Right ventricular systolic function is normal. The right ventricular size is normal. There is normal pulmonary artery systolic pressure.  3. The mitral valve is normal in structure. Mild mitral valve regurgitation. No evidence of mitral stenosis. FINDINGS  Left Ventricle: Left ventricular ejection fraction, by estimation, is 60 to 65%. The left ventricle has normal function. The left ventricle has no regional wall motion abnormalities. The left ventricular internal cavity size was normal in size. There is  no left ventricular hypertrophy. Left ventricular diastolic parameters were normal. Right Ventricle: The  right ventricular size is normal. No increase in right ventricular wall thickness. Right ventricular systolic function is normal. There is normal pulmonary artery systolic pressure. The tricuspid regurgitant velocity is 2.13 m/s, and  with an assumed right atrial pressure of 10 mmHg, the estimated right ventricular systolic pressure is 09.7 mmHg. Left Atrium: Left atrial size was normal in size. Right Atrium: Right atrial size was normal in size. Pericardium: There is no evidence of pericardial effusion. Mitral Valve: The mitral valve is normal in structure. Normal mobility of the mitral valve leaflets. Mild mitral valve regurgitation. No evidence of mitral valve stenosis. Tricuspid Valve: The tricuspid valve is normal in structure. Tricuspid valve regurgitation is not demonstrated. No evidence of tricuspid stenosis. Aortic Valve: The aortic valve is normal in structure. Aortic valve regurgitation is not visualized. No aortic stenosis is present. Aortic valve mean gradient measures 4.5 mmHg. Aortic valve peak gradient measures 8.4 mmHg. Aortic valve area, by VTI measures 3.26 cm. Pulmonic Valve: The pulmonic valve was normal in structure. Pulmonic valve regurgitation is not visualized. No evidence of pulmonic stenosis. Aorta: The aortic root is normal in size and structure. Venous: The inferior vena cava is normal in size with greater than 50% respiratory  variability, suggesting right atrial pressure of 3 mmHg. IAS/Shunts: No atrial level shunt detected by color flow Doppler.  LEFT VENTRICLE PLAX 2D LVIDd:         2.99 cm  Diastology LVIDs:         2.10 cm  LV e' lateral:   13.30 cm/s LV PW:         1.11 cm  LV E/e' lateral: 7.8 LV IVS:        0.84 cm  LV e' medial:    7.18 cm/s LVOT diam:     2.00 cm  LV E/e' medial:  14.5 LV SV:         96 LV SV Index:   53 LVOT Area:     3.14 cm  RIGHT VENTRICLE RV Basal diam:  3.01 cm RV S prime:     12.90 cm/s TAPSE (M-mode): 3.2 cm LEFT ATRIUM           Index       RIGHT  ATRIUM          Index LA diam:      3.50 cm 1.92 cm/m  RA Area:     9.20 cm LA Vol (A4C): 37.3 ml 20.46 ml/m RA Volume:   17.70 ml 9.71 ml/m  AORTIC VALVE AV Area (Vmax):    2.74 cm AV Area (Vmean):   2.67 cm AV Area (VTI):     3.26 cm AV Vmax:           144.50 cm/s AV Vmean:          100.100 cm/s AV VTI:            0.294 m AV Peak Grad:      8.4 mmHg AV Mean Grad:      4.5 mmHg LVOT Vmax:         126.00 cm/s LVOT Vmean:        85.000 cm/s LVOT VTI:          0.305 m LVOT/AV VTI ratio: 1.04 MITRAL VALVE                TRICUSPID VALVE MV Area (PHT): 3.00 cm     TR Peak grad:   18.1 mmHg MV Decel Time: 253 msec     TR Vmax:        213.00 cm/s MV E velocity: 104.00 cm/s MV A velocity: 72.00 cm/s   SHUNTS MV E/A ratio:  1.44         Systemic VTI:  0.30 m                             Systemic Diam: 2.00 cm Ida Rogue MD Electronically signed by Ida Rogue MD Signature Date/Time: 12/26/2019/6:34:44 PM    Final    US Abdomen Limited RUQ  Result Date: 12/25/2019 CLINICAL DATA:  Right upper quadrant pain. EXAM: ULTRASOUND ABDOMEN LIMITED RIGHT UPPER QUADRANT COMPARISON:  None. FINDINGS: Gallbladder: Echogenic sludge and subcentimeter shadowing echogenic gallstones are seen within the gallbladder lumen. Gallbladder wall thickening is seen (5.3 mm). No sonographic Murphy sign noted by sonographer. Common bile duct: Diameter: 4.7 mm (limited in visualization) Liver: No focal lesion identified. Diffusely increased echogenicity of the liver parenchyma is seen. Evaluation of the portal vein is markedly limited secondary to the increased echogenicity of the liver. As a result, portal vein patency and direction of flow cannot be confirmed. Other: None. IMPRESSION: 1. Cholelithiasis and gallbladder  sludge without evidence of acute cholecystitis. 2. Fatty liver. 3. Markedly limited evaluation of the portal vein, as described above. Electronically Signed   By: Virgina Norfolk M.D.   On: 12/25/2019 23:03     Assessment and Plan:   JULLISA GRIGORYAN is a 59 y.o. y/o female with recent admission for decompensated alcoholic liver cirrhosis.  Severe alcoholic hepatitis with hepatic encephalopathy and acute liver failure.. Presently no hepatic encephalopathy.  Stopped all alcohol.  Appears to be less jaundiced.  Eating better.  Plan 1.  Continue lactulose titrated to 2 soft bowel movements per day and Xifaxan. 2.  Stable from all alcohol and seek help from alcoholic Anonymous. 3.  Continue Aldactone 100 mg/day.  Await labs from today.  If creatinine, potassium permit will increase dose to 200 mg of Aldactone.  I will avoid Lasix as she had hypokalemia in the hospital and the purpose of Lasix is only to prevent hyperkalemia from the spironolactone. 4.  Check CBC, CMP 5.  Follow-up with appointment with palliative care. 6.  Avoid all NSAIDs, low-sodium diet patient information to be provided 7.  Low folic acid commence on folate supplementation 8.  Advised her to stop smoking  Dr Jonathon Bellows  MD,MRCP [facility]) Follow up in 4-6 weeks

## 2020-01-14 ENCOUNTER — Encounter: Payer: Self-pay | Admitting: Nurse Practitioner

## 2020-01-14 ENCOUNTER — Other Ambulatory Visit: Payer: Medicaid Other | Admitting: Nurse Practitioner

## 2020-01-14 DIAGNOSIS — Z515 Encounter for palliative care: Secondary | ICD-10-CM

## 2020-01-14 DIAGNOSIS — K7010 Alcoholic hepatitis without ascites: Secondary | ICD-10-CM

## 2020-01-14 LAB — CBC WITH DIFFERENTIAL/PLATELET
Basophils Absolute: 0.1 10*3/uL (ref 0.0–0.2)
Basos: 1 %
EOS (ABSOLUTE): 0.1 10*3/uL (ref 0.0–0.4)
Eos: 1 %
Hematocrit: 27.8 % — ABNORMAL LOW (ref 34.0–46.6)
Hemoglobin: 9.8 g/dL — ABNORMAL LOW (ref 11.1–15.9)
Immature Grans (Abs): 0.1 10*3/uL (ref 0.0–0.1)
Immature Granulocytes: 1 %
Lymphocytes Absolute: 1.6 10*3/uL (ref 0.7–3.1)
Lymphs: 13 %
MCH: 32.6 pg (ref 26.6–33.0)
MCHC: 35.3 g/dL (ref 31.5–35.7)
MCV: 92 fL (ref 79–97)
Monocytes Absolute: 0.8 10*3/uL (ref 0.1–0.9)
Monocytes: 6 %
Neutrophils Absolute: 9.9 10*3/uL — ABNORMAL HIGH (ref 1.4–7.0)
Neutrophils: 78 %
Platelets: 141 10*3/uL — ABNORMAL LOW (ref 150–450)
RBC: 3.01 x10E6/uL — ABNORMAL LOW (ref 3.77–5.28)
RDW: 14.3 % (ref 11.7–15.4)
WBC: 12.5 10*3/uL — ABNORMAL HIGH (ref 3.4–10.8)

## 2020-01-14 LAB — COMPREHENSIVE METABOLIC PANEL
ALT: 32 IU/L (ref 0–32)
AST: 92 IU/L — ABNORMAL HIGH (ref 0–40)
Albumin/Globulin Ratio: 0.8 — ABNORMAL LOW (ref 1.2–2.2)
Albumin: 2.3 g/dL — ABNORMAL LOW (ref 3.8–4.9)
Alkaline Phosphatase: 534 IU/L — ABNORMAL HIGH (ref 48–121)
BUN/Creatinine Ratio: 18 (ref 9–23)
BUN: 18 mg/dL (ref 6–24)
Bilirubin Total: 8 mg/dL — ABNORMAL HIGH (ref 0.0–1.2)
CO2: 11 mmol/L — ABNORMAL LOW (ref 20–29)
Calcium: 7.5 mg/dL — ABNORMAL LOW (ref 8.7–10.2)
Chloride: 103 mmol/L (ref 96–106)
Creatinine, Ser: 0.99 mg/dL (ref 0.57–1.00)
GFR calc Af Amer: 73 mL/min/{1.73_m2} (ref >59)
GFR calc non Af Amer: 63 mL/min/{1.73_m2} (ref >59)
Globulin, Total: 3 g/dL (ref 1.5–4.5)
Glucose: 122 mg/dL — ABNORMAL HIGH (ref 65–99)
Potassium: 3.7 mmol/L (ref 3.5–5.2)
Sodium: 131 mmol/L — ABNORMAL LOW (ref 134–144)
Total Protein: 5.3 g/dL — ABNORMAL LOW (ref 6.0–8.5)

## 2020-01-14 LAB — PROTIME-INR
INR: 1.1 (ref 0.9–1.2)
Prothrombin Time: 12.2 s — ABNORMAL HIGH (ref 9.1–12.0)

## 2020-01-14 NOTE — Progress Notes (Signed)
[facility] Consult Note Telephone: [phone]  Fax: [phone]  PATIENT NAME: Katherine Goodwin DOB: 02/13/1961 MRN: [id number]  PRIMARY CARE PROVIDER:   Glean Hess, MD  REFERRING PROVIDER:  Glean Hess, MD [address] [place],  [place] [address]   I was asked by Dr Army Melia to see Katherine [facility]win for Palliative care consult for goals of care  RESPONSIBLE PARTY:   Self; husband Katherine Goodwin RECOMMENDATIONS and PLAN:  1. ACP: DNR; completed placed in [facility]  2. Weakness/debility secondary to decompensation secondary to liver failure  3. Palliative care encounter; Palliative medicine team will continue to support patient, patient's family, and medical team. Visit consisted of counseling and education dealing with the complex and emotionally intense issues of symptom management and palliative care in the setting of serious and potentially life-threatening illness  4. Follow-up transition from Palliative care to Hospice  I spent 90 minutes providing this consultation,  from 12:45pm to 2:15pm. More than 50% of the time in this consultation was spent coordinating communication.   HISTORY OF PRESENT ILLNESS:  Katherine Goodwin is a 59 y.o. year old female with multiple medical problems including Tubular adenoma  of colon, liver alcoholic steatohepatitis, Peripheral artery disease, Seizure disorder, osteoarthritis, h/o kidney stones come by pretentious, gerd, asthma, depression, tobacco abuse, bipolar disorder, pulpectomy, foot surgery. Hospitalize 6 / 24/2021 to 6 / 27 / 2021 for heavy alcohol use brought some urgency Department by her daughter with concerns of confusion worsening memory jaundice. Work up significant for acute metabolic encephalopathy, acute hepatic encephalopathy suspected alcoholic chronic liver disease with acute decompensation. Discharged [facility]. Hospitalized 7 / 7 / 2021 to 7 / 13 / 2021 for compensated liver  disease with bright red blood per rectum with work up significant for decompensated liver disease with hyperbilirubinemia, transaminitis secondary to alcohol abuse with ultrasound showing cholelithiasis and gallbladder sludge. Hypertension Echo within normal limits. Acute blood loss per rectum IV Protonix, hemoglobin stable with EGD on 7/9 / 2021 no active bleed. Acute kidney injury likely from ATM from hypertension, hepatic encephalopathy with mentation improve. Electrolytes corrected. Underlying disease started aldactone. Palliative care did consult during hospitalization with wishes for aggressive interventions, full code. Seen by GI 7/26 / 2021 with improved appetite, stopped alcohol appeared less jaundice. Continue lactulose and xifaxan, aldactone advice to stop smoking. In-person initial palliative care visit. I visited and observed Katherine Goodwin. We talked about purpose of palliative care visit. Katherine [place]win in agreement. Katherine Goodwin and husband [place] Goodwin was present for a part of palliative care visit. We talked about how she was feeling. Katherine Goodwin endorses that she does not feel good. Katherine [place]win feet are very swollen, weeping. Katherine [place] abdomen is very swollen. Katherine [place]win does have a little bit of an appetite but she is very tired. We talked about life review and she previously worked for [employer]. Katherine Goodwin had three daughters and a husband [place]. Katherine [place]. We talked about her brother who recently passed away from liver cirrhosis under [facility]. We talked about symptoms of pain what she does intermittent experience in her back. Katherine Goodwin endorses that she woke her husband up during the night because of her back hurting and he rubbed it making it much better. We talked about fatigue and weakness. We talked about past medical history. Katherine Goodwin endorses that her brother never stopped drinking but she has now. Katherine Goodwin endorses she  has been told that her liver failure is  terminal and that means that she will die. We talked about recent hospitalizations. We talked about her functional ability which is very limited currently. Katherine [place]win is having great difficulty caring for herself, adl's as her husband has to [facility] her. Katherine [place]win feeding herself and appetite has improved a little since hospitalization. We talked about medical goals of care including aggressive versus conservative. We talked about code status if she currently is a full code. Katherine Goodwin endorses that she is actually change that to a DNR. We talked about [place] form which she does not have in her [facility]. Katherine Goodwin endorses she was in agreement to completing a new form and placing in in [place]. We talked about returning to the hospital, Katherine [place]win endorses she wants to stay at [facility]. We talked about role of palliative care and plan of care. We talked about an option of [facility] in what they would provide under the Medicare benefit. Katherine Goodwin endorses her wishes are to have Hospice at [facility] if she is eligible and in agreement have [facility] review her case. We talked about calling her daughter Katherine Goodwin who does help her with things that she needs. Katherine Goodwin endorses she just wants her husband Katherine [place]win to know at this time and she will tell her daughter Katherine [place]. We talk sharing information with Katherine [place]win but Katherine Goodwin was very adamant about telling Katherine [place]win herself. We talked about quality of life versus quantity of days. We talked about process if she is found Hospice eligible will contact Primary Provider for Hospice order then Hospice will contact to schedule admission visit. Katherine [place]win in agreement. Therapeutic listening and emotional support provided. Contact information. Questions answered to satisfaction. Katherine Goodwin in agreement with plan of care. Emailed to [facility] for review. [facility] deemed Hospice eligible. I called and updated Katherine [place]win, wishes are to proceed with hospice, will contact  primary provider for Hospice order.  Palliative Care was asked to help address goals of care.   CODE STATUS: DNR  PPS: 30% HOSPICE ELIGIBILITY/DIAGNOSIS: TBD  PAST MEDICAL HISTORY:  Past Medical History:  Diagnosis Date  . Asthma   . Depression   . GERD (gastroesophageal reflux disease)   . Hypertension   . Kidney stones   . Osteoarthrosis, unspecified whether generalized or localized, involving lower leg    leg  . Other bipolar disorders   . Seizure ([place])    none since age 89  . Tobacco abuse   . Trigger finger   . Wears dentures    partial upper    SOCIAL HX:  Social History   Tobacco Use  . Smoking status: Current Every Day Smoker    Packs/day: 0.25    Years: 39.00    Pack years: 9.75    Types: Cigarettes  . Smokeless tobacco: Never Used  Substance Use Topics  . Alcohol use: Yes    Alcohol/week: 7.0 standard drinks    Types: 3 Cans of beer, 4 Shots of liquor per week    Comment: Pt states 3-4 shots a day    ALLERGIES:  Allergies  Allergen Reactions  . Prozac [Fluoxetine Hcl] Other (See Comments)    Homicidal and suicidal thoughts  . Seroquel [Quetiapine Fumarate] Other (See Comments)    Homicidal and suicidal thoughts  . Sulfa Antibiotics Nausea And Vomiting     PERTINENT MEDICATIONS:  Outpatient Encounter Medications as of 01/14/2020  Medication Sig  . diphenhydrAMINE (BENADRYL) 25 MG tablet  Take 25 mg by mouth every 6 (six) hours as needed for itching or allergies.   . folic acid (FOLVITE) 1 MG tablet Take 1 tablet (1 mg total) by mouth daily.  Marland Kitchen lactulose (CHRONULAC) 10 GM/15ML solution Take 45 mLs (30 g total) by mouth daily.  Marland Kitchen lidocaine (LIDODERM) 5 % Place 2 patches onto the skin daily. Remove & Discard patch within 12 hours or as directed by MD  . Multiple Vitamin (MULTIVITAMIN WITH MINERALS) TABS tablet Take 1 tablet by mouth daily.  . pantoprazole (PROTONIX) 40 MG tablet Take 1 tablet (40 mg total) by mouth daily.  . rifaximin (XIFAXAN) 550 MG  TABS tablet Take 1 tablet (550 mg total) by mouth 2 (two) times daily.  Marland Kitchen spironolactone (ALDACTONE) 100 MG tablet Take 1 tablet (100 mg total) by mouth daily.   No facility-administered encounter medications on file as of 01/14/2020.    PHYSICAL EXAM:   General: debilitated, chronically ill, jaundice pleasant female Cardiovascular: regular rate and rhythm Pulmonary: clear ant fields Abdomen: round, tight,  + bowel sounds Extremities: 4+BLE/feet;  edema Neurological: unable to ambulate; total life  Keen Ewalt Ihor Gully, NP

## 2020-01-15 ENCOUNTER — Telehealth: Payer: Self-pay

## 2020-01-15 ENCOUNTER — Telehealth: Payer: Self-pay | Admitting: Internal Medicine

## 2020-01-15 NOTE — Telephone Encounter (Signed)
Tried to call patient but unable to leave a message because voicemail is full

## 2020-01-15 NOTE — Telephone Encounter (Signed)
Called to get a verbal order for Hospice Care.  Please advise and call to confirm at [phone]

## 2020-01-15 NOTE — Telephone Encounter (Signed)
Called Christin left VM giving verbal orders. Name was stated on VM.  KP

## 2020-01-15 NOTE — Telephone Encounter (Signed)
-----   Message from Jonathon Bellows, MD sent at 01/14/2020 10:06 AM EDT ----- Sherald Hess Inform   1. Liver tests look much better, continue stay off alcohol, stop smoking , low sodium diet , high protein   2. Increase aldactone to 200 mg a day and repeat BMP in 5 days   3. Daily weights  4. Maintain chart of weights and if gaining to call and inform me   C/c Army Melia Jesse Sans, MD   Dr Jonathon Bellows MD,MRCP [facility]) Gastroenterology/Hepatology Pager: [phone]

## 2020-01-18 ENCOUNTER — Ambulatory Visit: Payer: Self-pay

## 2020-01-20 ENCOUNTER — Telehealth: Payer: Self-pay | Admitting: Internal Medicine

## 2020-01-20 ENCOUNTER — Other Ambulatory Visit: Payer: Self-pay

## 2020-01-20 DIAGNOSIS — K7031 Alcoholic cirrhosis of liver with ascites: Secondary | ICD-10-CM

## 2020-01-20 MED ORDER — SPIRONOLACTONE 100 MG PO TABS: 200.0000 mg | ORAL_TABLET | Freq: Every day | ORAL | 1 refills | Status: AC

## 2020-01-20 NOTE — Telephone Encounter (Signed)
Called and gave Verbal orders for patient.   CM

## 2020-01-20 NOTE — Telephone Encounter (Signed)
Home Health Verbal Orders - Caller/Agency: [facility] Callback Number: [phone] secure  Requesting OT/PT/Skilled Nursing/Social Work/Speech Therapy: [place] [facility] consult, Nursing consult for wounds, Social work for transportation.  Frequency: n/a

## 2020-01-27 LAB — FUNGUS CULTURE RESULT

## 2020-01-27 LAB — FUNGUS CULTURE WITH STAIN

## 2020-01-27 LAB — FUNGAL ORGANISM REFLEX

## 2020-02-11 ENCOUNTER — Other Ambulatory Visit: Payer: Self-pay | Admitting: Internal Medicine

## 2020-02-11 DIAGNOSIS — K7031 Alcoholic cirrhosis of liver with ascites: Secondary | ICD-10-CM

## 2020-02-11 DIAGNOSIS — K7469 Other cirrhosis of liver: Secondary | ICD-10-CM

## 2020-02-11 DIAGNOSIS — K2980 Duodenitis without bleeding: Secondary | ICD-10-CM

## 2020-02-11 DIAGNOSIS — F1020 Alcohol dependence, uncomplicated: Secondary | ICD-10-CM

## 2020-02-11 DIAGNOSIS — F17200 Nicotine dependence, unspecified, uncomplicated: Secondary | ICD-10-CM

## 2020-02-11 DIAGNOSIS — E871 Hypo-osmolality and hyponatremia: Secondary | ICD-10-CM

## 2020-02-11 DIAGNOSIS — K2960 Other gastritis without bleeding: Secondary | ICD-10-CM

## 2020-02-11 DIAGNOSIS — Z9181 History of falling: Secondary | ICD-10-CM

## 2020-02-11 DIAGNOSIS — I10 Essential (primary) hypertension: Secondary | ICD-10-CM

## 2020-02-11 DIAGNOSIS — K921 Melena: Secondary | ICD-10-CM

## 2020-02-11 DIAGNOSIS — J4520 Mild intermittent asthma, uncomplicated: Secondary | ICD-10-CM

## 2020-02-11 DIAGNOSIS — D539 Nutritional anemia, unspecified: Secondary | ICD-10-CM

## 2020-02-11 DIAGNOSIS — K7290 Hepatic failure, unspecified without coma: Secondary | ICD-10-CM

## 2020-02-11 DIAGNOSIS — F1210 Cannabis abuse, uncomplicated: Secondary | ICD-10-CM

## 2020-02-11 DIAGNOSIS — E876 Hypokalemia: Secondary | ICD-10-CM

## 2020-02-11 DIAGNOSIS — K7010 Alcoholic hepatitis without ascites: Secondary | ICD-10-CM

## 2020-02-11 NOTE — Progress Notes (Signed)
Received orders from [place]. Start of care 01/08/20.   Orders from 01/08/20 through 01/17/20 are reviewed, signed and faxed.

## 2020-02-19 DEATH — deceased

## 2020-03-26 ENCOUNTER — Ambulatory Visit: Payer: Medicaid Other | Admitting: Gastroenterology

## 2020-11-12 ENCOUNTER — Encounter: Payer: Self-pay | Admitting: Nurse Practitioner
